# Patient Record
Sex: Female | Born: 1970
Health system: Southern US, Community
[De-identification: ages and names within clinical notes are randomized; demographics above are authoritative.]

## PROBLEM LIST (undated history)

## (undated) DIAGNOSIS — E119 Type 2 diabetes mellitus without complications: Secondary | ICD-10-CM

## (undated) DIAGNOSIS — I10 Essential (primary) hypertension: Secondary | ICD-10-CM

## (undated) DIAGNOSIS — K5792 Diverticulitis of intestine, part unspecified, without perforation or abscess without bleeding: Secondary | ICD-10-CM

## (undated) DIAGNOSIS — R112 Nausea with vomiting, unspecified: Secondary | ICD-10-CM

## (undated) DIAGNOSIS — K429 Umbilical hernia without obstruction or gangrene: Secondary | ICD-10-CM

## (undated) DIAGNOSIS — K219 Gastro-esophageal reflux disease without esophagitis: Secondary | ICD-10-CM

## (undated) DIAGNOSIS — Z9889 Other specified postprocedural states: Secondary | ICD-10-CM

## (undated) DIAGNOSIS — M069 Rheumatoid arthritis, unspecified: Secondary | ICD-10-CM

## (undated) HISTORY — PX: APPENDECTOMY: SHX54

## (undated) HISTORY — PX: ABDOMINOPLASTY: SUR9

## (undated) HISTORY — PX: WISDOM TOOTH EXTRACTION: SHX21

## (undated) HISTORY — PX: ABDOMINAL HYSTERECTOMY: SHX81

## (undated) HISTORY — PX: BACK SURGERY: SHX140

## (undated) HISTORY — PX: HERNIA REPAIR: SHX51

## (undated) HISTORY — DX: Rheumatoid arthritis, unspecified: M06.9

---

## 2010-06-12 LAB — CBC
HCT: 40.1 % (ref 36.0–46.0)
Hemoglobin: 14.3 g/dL (ref 12.0–15.0)
MCH: 31.4 pg (ref 26.0–34.0)
MCHC: 35.7 g/dL (ref 30.0–36.0)
MCV: 87.9 fL (ref 78.0–100.0)
Platelets: 197 10*3/uL (ref 150–400)
RBC: 4.56 MIL/uL (ref 3.87–5.11)
RDW: 13.2 % (ref 11.5–15.5)
WBC: 6 10*3/uL (ref 4.0–10.5)

## 2010-06-12 LAB — COMPREHENSIVE METABOLIC PANEL
ALT: 19 U/L (ref 0–35)
AST: 21 U/L (ref 0–37)
Albumin: 4.2 g/dL (ref 3.5–5.2)
Alkaline Phosphatase: 71 U/L (ref 39–117)
BUN: 10 mg/dL (ref 6–23)
CO2: 26 mEq/L (ref 19–32)
Calcium: 9.2 mg/dL (ref 8.4–10.5)
Chloride: 104 mEq/L (ref 96–112)
Creatinine, Ser: 0.71 mg/dL (ref 0.4–1.2)
GFR calc Af Amer: >60 mL/min (ref >60)
GFR calc non Af Amer: >60 mL/min (ref >60)
Glucose, Bld: 84 mg/dL (ref 70–99)
Potassium: 3.6 mEq/L (ref 3.5–5.1)
Sodium: 136 mEq/L (ref 135–145)
Total Bilirubin: 0.7 mg/dL (ref 0.3–1.2)
Total Protein: 7.3 g/dL (ref 6.0–8.3)

## 2010-06-12 LAB — SURGICAL PCR SCREEN
MRSA, PCR: NEGATIVE
Staphylococcus aureus: NEGATIVE

## 2010-06-13 ENCOUNTER — Ambulatory Visit (HOSPITAL_COMMUNITY)
Admission: RE | Admit: 2010-06-13 | Discharge: 2010-06-13 | Payer: Self-pay | Source: Home / Self Care | Attending: Obstetrics & Gynecology | Admitting: Obstetrics & Gynecology

## 2010-06-14 LAB — PREGNANCY, URINE: Preg Test, Ur: NEGATIVE

## 2010-06-14 LAB — GLUCOSE, CAPILLARY: Glucose-Capillary: 129 mg/dL — ABNORMAL HIGH (ref 70–99)

## 2010-06-19 ENCOUNTER — Encounter
Admission: RE | Admit: 2010-06-19 | Discharge: 2010-06-19 | Payer: Self-pay | Source: Home / Self Care | Attending: Obstetrics & Gynecology | Admitting: Obstetrics & Gynecology

## 2010-06-19 IMAGING — CR DG CHEST 2V
3 series · 3 of 3 positions shown · non-contrast
Comparison: None.

CLINICAL DATA: Short of breath.  Chest pain.

CHEST - 2 VIEW

[view not recorded (1 of 3)]
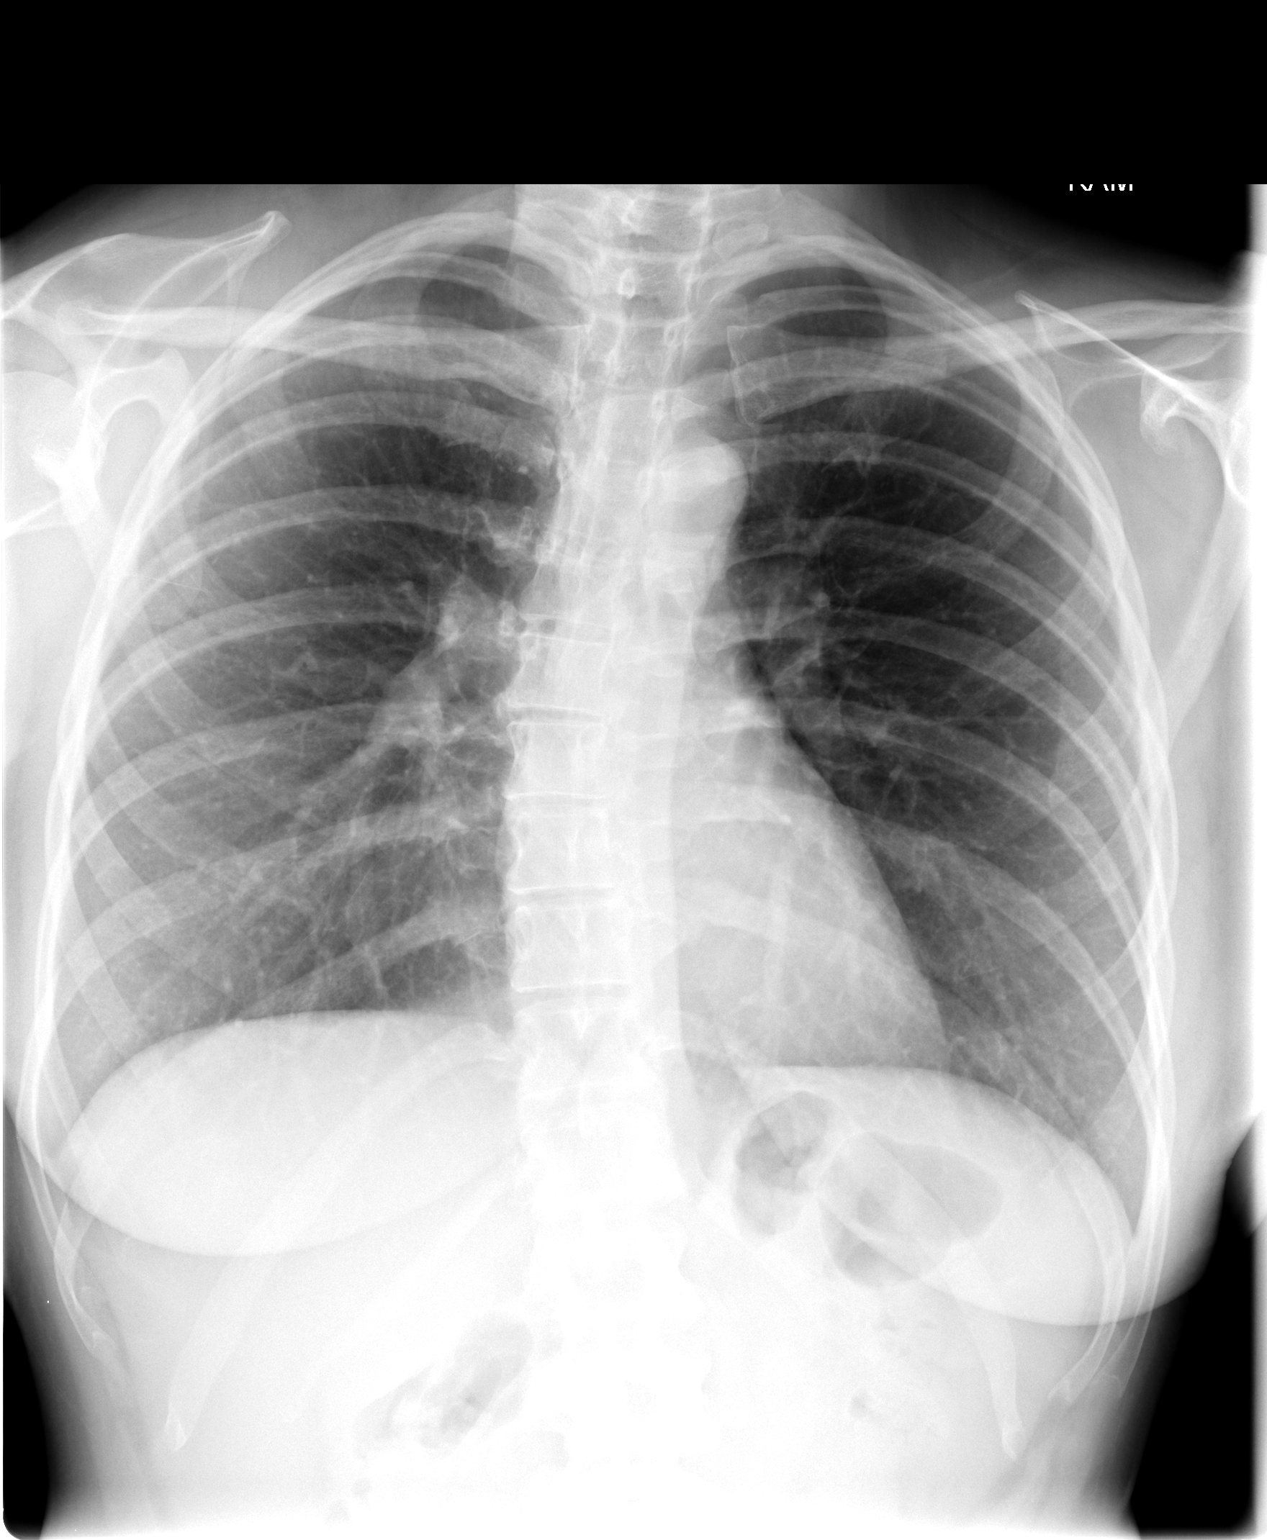

[view not recorded (2 of 3)]
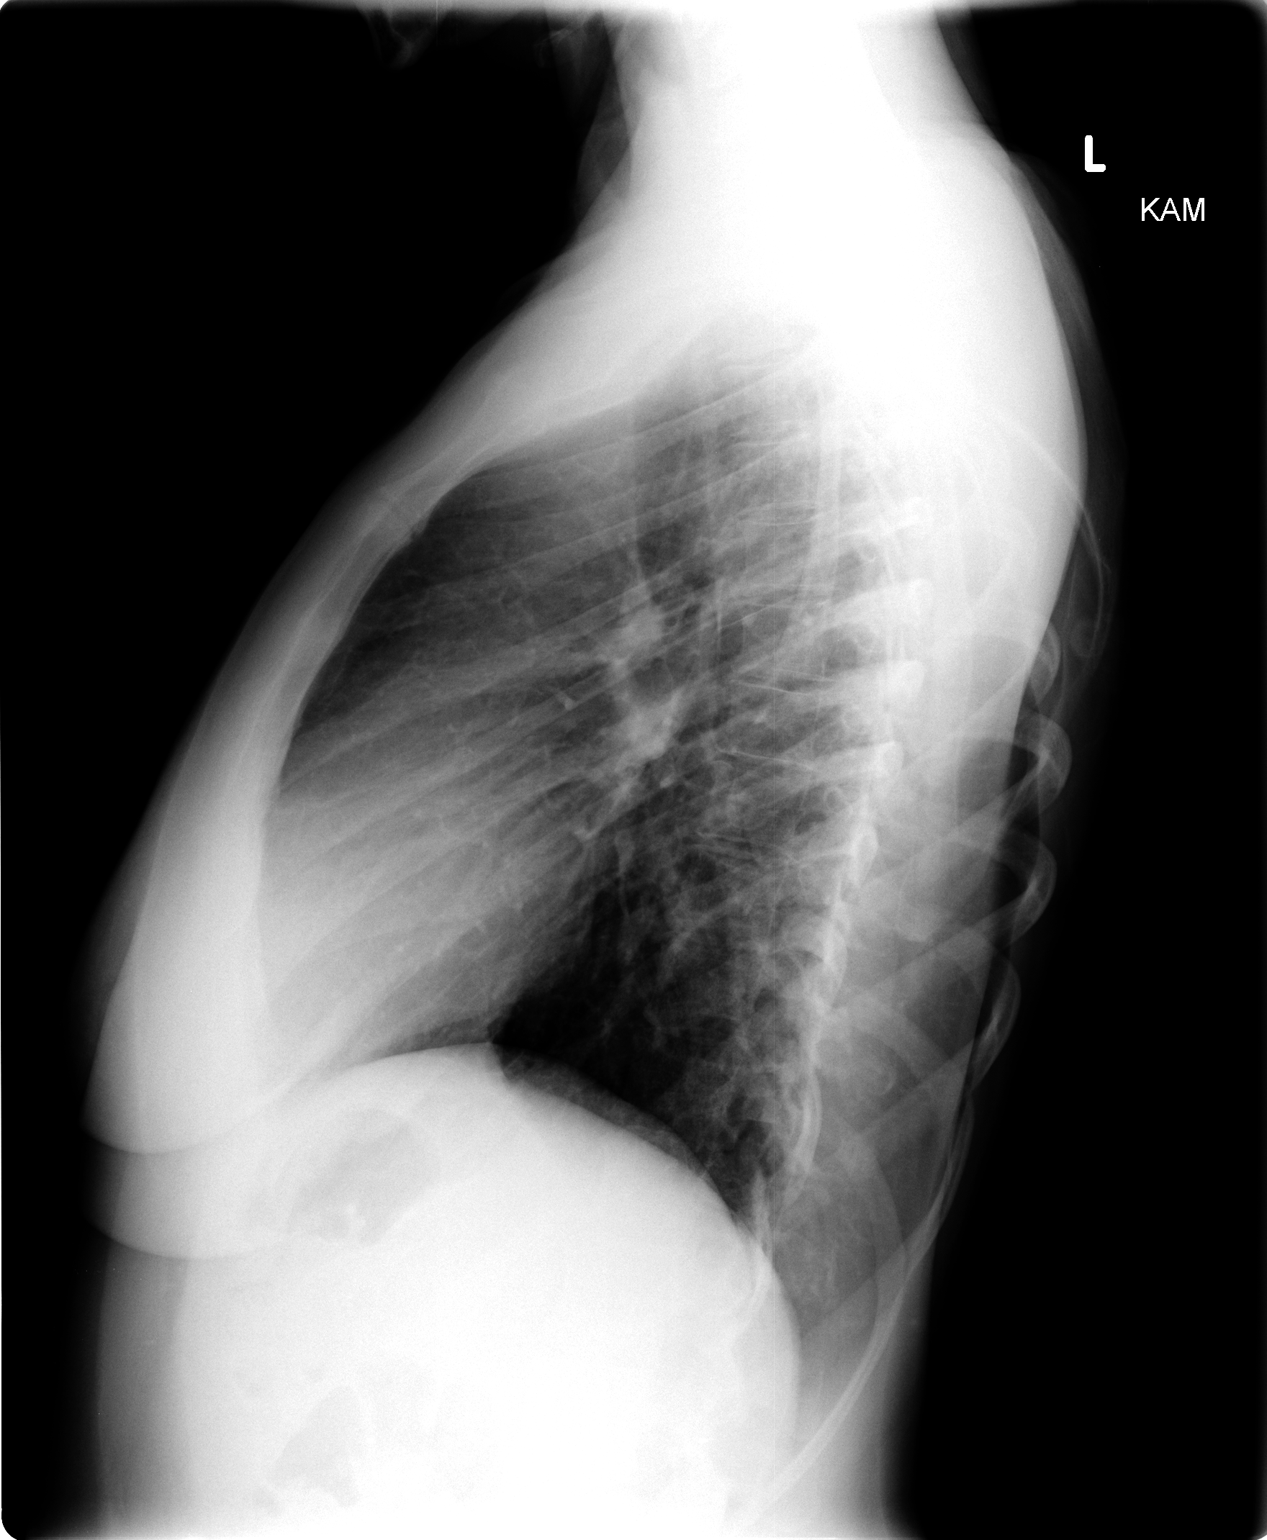

[view not recorded (3 of 3)]
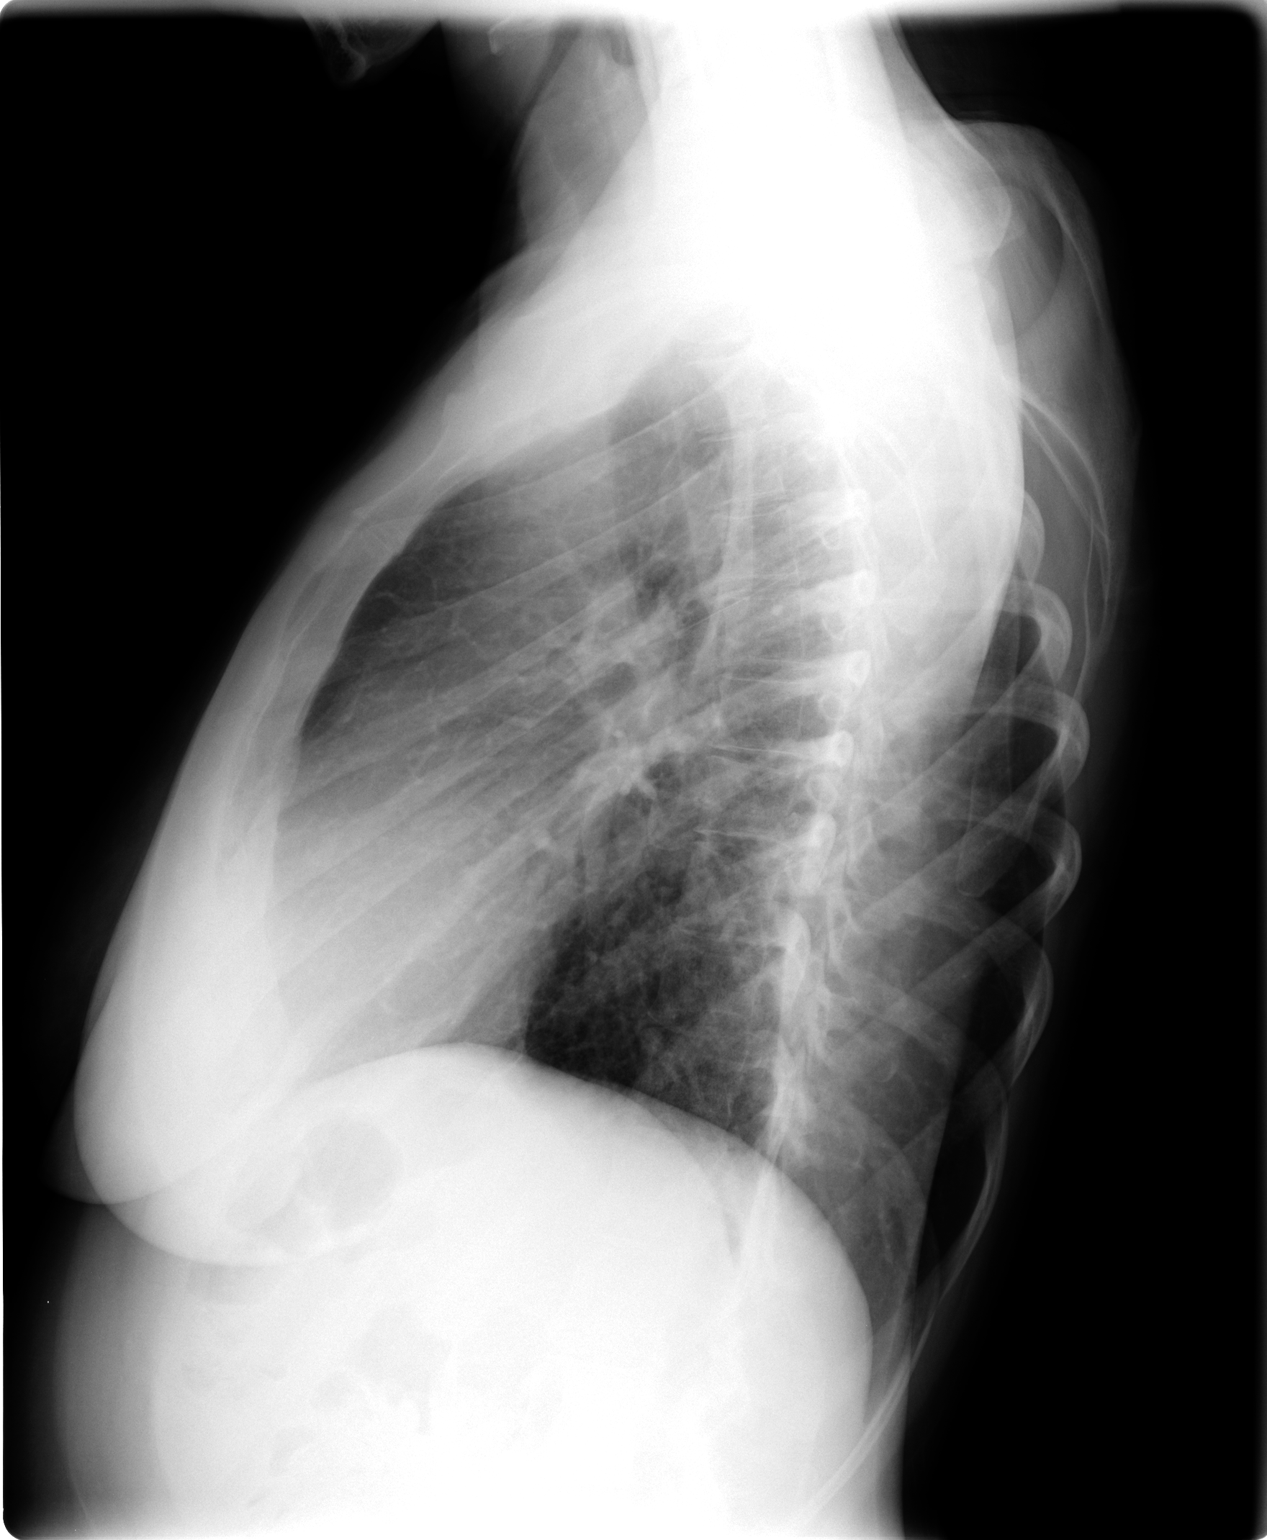

[3 of 3 positions shown; findings below may reference images not displayed]

FINDINGS: Patient is mildly rotated to the left.  The
cardiopericardial silhouette appears within normal limits.  There
is no airspace disease.  No effusion.
IMPRESSION: No active cardiopulmonary disease.

## 2010-06-23 NOTE — Op Note (Addendum)
Lisa Novak, Lisa Novak              ACCOUNT NO.:  [id number]  MEDICAL RECORD NO.:  [id number]          PATIENT TYPE:  AMB  LOCATION:  [facility]                           FACILITY:  [facility]  PHYSICIAN:  Genia Del, M.D.DATE OF BIRTH:  November 21, 1970  DATE OF PROCEDURE:  06/13/2010 DATE OF DISCHARGE:                              OPERATIVE REPORT   PREOPERATIVE DIAGNOSIS:  Refractory dysmenorrhea and menorrhagia.  POSTOPERATIVE DIAGNOSIS:  Refractory dysmenorrhea and menorrhagia.  PROCEDURE:  Total laparoscopy hysterectomy assisted with Engineer, building services robot.  SURGEON:  Genia Del, MD  ASSISTANT:  Arlan Organ, MD  ANESTHESIOLOGIST:  Quillian Quince, MD  DESCRIPTION OF PROCEDURE:  Under general anesthesia with endotracheal intubation, the patient is in lithotomy position, she was prepped with SurgiPrep on the abdomen and with Betadine on the vulvar and vaginal areas.  The patient was then draped as usual.  The vaginal exam revealed an anteverted uterus about 9 cm.  No adnexal mass.  The weighted speculum was inserted in the vagina.  The anterior lip of the cervix was grasped with a tenaculum.  The hysterometry is at 9 cm.  A #8 RUMI was therefore used.  The large KOH ring was used given the size of the cervix.  We dilated the cervix with Hegar dilators up to #29 without difficulty.  We then inserted the RUMI with KOH ring without difficulty. We put an occluder as well.  We removed the tenaculum and weighted speculum.  The Foley was put in place in the bladder.  We then did go to the abdomen.  The patient had a tummy tuck previously, but no mesh were put in place.  We used the umbilicus and infiltrating the subcutaneous tissue at the upper aspect of it with Marcaine 1/4 plain.  We opened with a scalpel over 1.5 cm at the site of the previous incision.  We opened the aponeurosis under direct vision with Mayo scissors and opened the parietal peritoneum bluntly with a finger.  We put a  pursestring stitch of Vicryl 0 at the aponeurosis.  The Hasson was inserted at that level.  A pneumoperitoneum was created with CO2.  We inspected the abdominopelvic cavities.  No lesion was seen in the abdomen.  The anterior wall was free of adhesions.  The pelvic structures appeared normal.  Both ovaries were normal in size and appearance.  Both tubes were normal in size and appearance.  The uterus was slightly enlarged, measuring about 9 cm in diameter.  No adhesion was present in the pelvis.  We marked the skin to use a semicircular configuration with 2 robotic ports on the left, 1 robotic port on the right, and the assistant port on the lower right.  The skin was infiltrated with Marcaine 1/4 plain at each site and small incisions were made with a scalpel at each site.  We entered the trocars under direct vision.  We then positioned the patient in deep Trendelenburg and docked the robot without problems on the left side.  The instruments were inserted,  the Endo shear scissors in the first robotic arm, the PK in the secondrobotic arm, and the  Cobra clamp in the third robotic arm.  We did go to the console.  We started on the left side.  We cauterized and sectioned the left round ligament, cauterized and sectioned the left tube, and the left utero-ovarian ligament.  We moved on the lateral side of the uterus and stopped just before the uterine artery.  We opened the visceral peritoneum anteriorly and started descending the bladder.  We proceeded exactly the same way on the right side and then further descended the bladder past the KOH ring by about 1.5 cm.  We then cauterized and sectioned the left uterine artery and finally cauterized and sectioned the right uterine artery.  The uterus was blanching well.  We inflated the occluder and we used the tip of the Endo Shear scissors to open the superior aspect of the vagina just at the upper rim of the KOH ring.  We started anteriorly, did  go to the left lateral side, right lateral side, and finished posteriorly.  The uterus was completely detached with the cervix and it was passed without morcellation vaginally.  We left the uterus in the vagina for occlusion.  We changed robotic instruments. The cutting needle driver in the first arm, the regular needle driver in the second robotic arm, and the PK in the third robotic arm.  We used Vicryl 0 6 inches and closed the vaginal vault with 5 figure-of-eights, starting on the right angle, the left angle, and then the middle.  We made sure we have good bites including the vaginal mucosa each time. The vaginal vault was well closed.  Hemostasis was completed with PK. Before closure and after closure, hemostasis was adequate at all levels. We therefore removed all robotic instruments.  We undocked the robot and did go to laparoscopy time.  The Trendelenburg is reversed and the patient's legs were brought up to a little bit.  We irrigated and suctioned the abdominopelvic cavities.  Both ureters were seen in robotic time, peristalsing well.  We confirmed good hemostasis at all levels.  The ovaries were normal in appearance.  Good hemostasis at that level as well.  The appendix was seen and appeared normal.  The liver was normal to inspection.  We therefore removed all instruments.  We evacuated the CO2 as much as possible.  We then removed all trocars under direct vision.  We completed hemostasis with the electrocautery on the incisions where required.  We then attached the pursestring stitch at the supraumbilical incision.  We put a separate stitch at the adipose tissue at the supraumbilical incision with Vicryl 4-0 and we closed all incisions with a subcuticular stitch of Vicryl 4-0 and put Dermabond. The uterus and cervix were removed from the vagina and sent to Pathology.  We verified that hemostasis was good vaginally and the Foley catheter was removed.  The estimated blood loss  was 75 mL.  The patient received cefoxitin IV before induction.  The count of sponges and instruments was complete.  No complications occurred and the patient was brought to recovery room in good stable status.     Genia Del, M.D.     ML/MEDQ  D:  06/13/2010  T:  06/13/2010  Job:  [id number]  Electronically Signed by Genia Del M.D. on 06/23/2010 09:16:16 AM

## 2012-08-28 ENCOUNTER — Other Ambulatory Visit: Payer: Self-pay | Admitting: Internal Medicine

## 2012-08-28 DIAGNOSIS — I10 Essential (primary) hypertension: Secondary | ICD-10-CM

## 2012-08-29 ENCOUNTER — Ambulatory Visit
Admission: RE | Admit: 2012-08-29 | Discharge: 2012-08-29 | Disposition: A | Discharge status: Home / Self Care | Payer: PRIVATE HEALTH INSURANCE | Source: Ambulatory Visit | Attending: Internal Medicine | Admitting: Internal Medicine

## 2012-08-29 DIAGNOSIS — I10 Essential (primary) hypertension: Secondary | ICD-10-CM

## 2012-08-29 IMAGING — US US RENAL
1 series · 14 of 25 positions shown · non-contrast
Comparison: None.

CLINICAL DATA: Hypertension

RENAL/URINARY TRACT ULTRASOUND COMPLETE

[Series 1: us renal · 0.26mm/px · 14 of 33 slices shown]
[im 1/33]
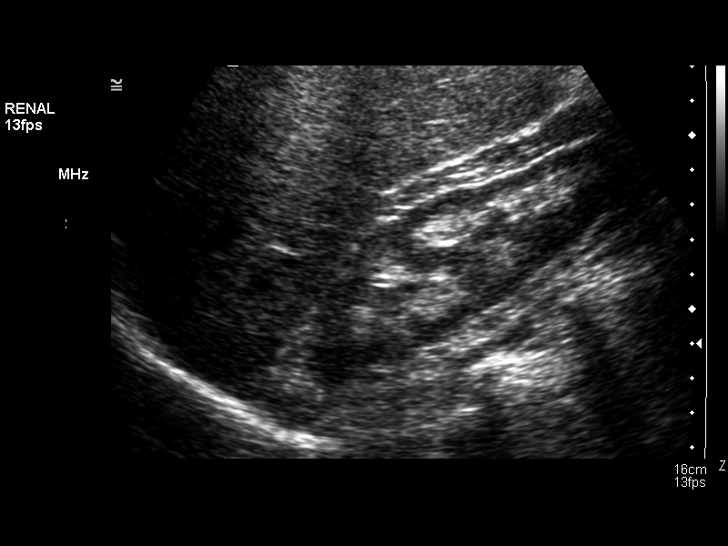
[im 3/33]
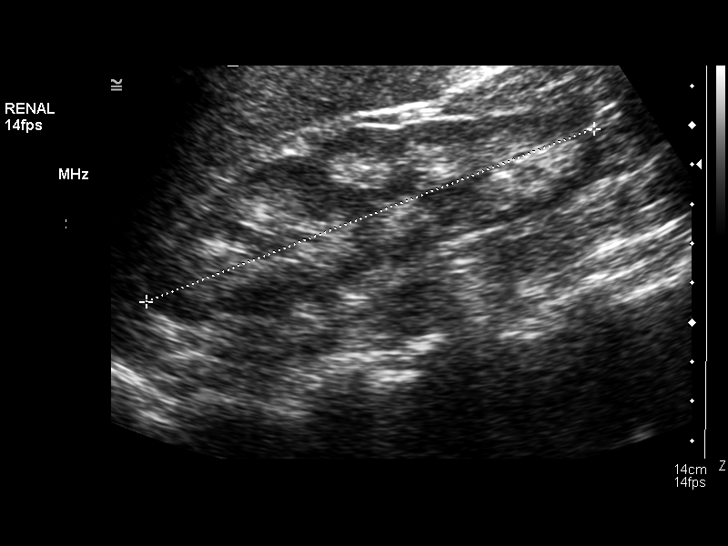
[im 6/33]
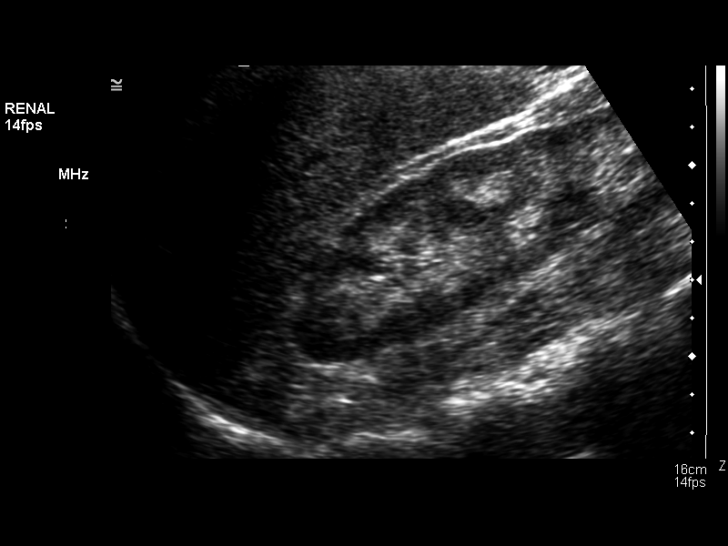
[im 9/33]
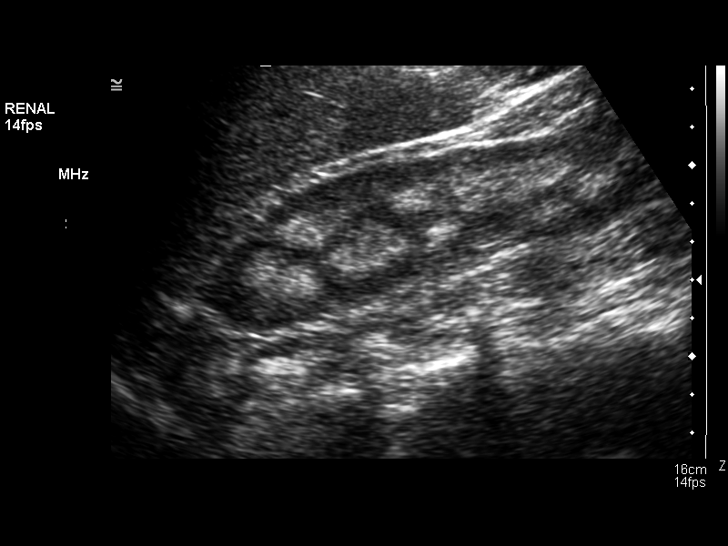
[im 11/33]
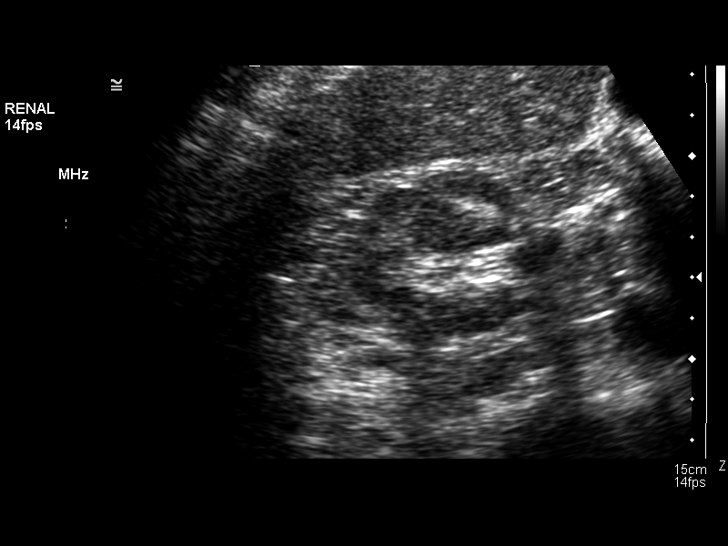
[im 13/33]
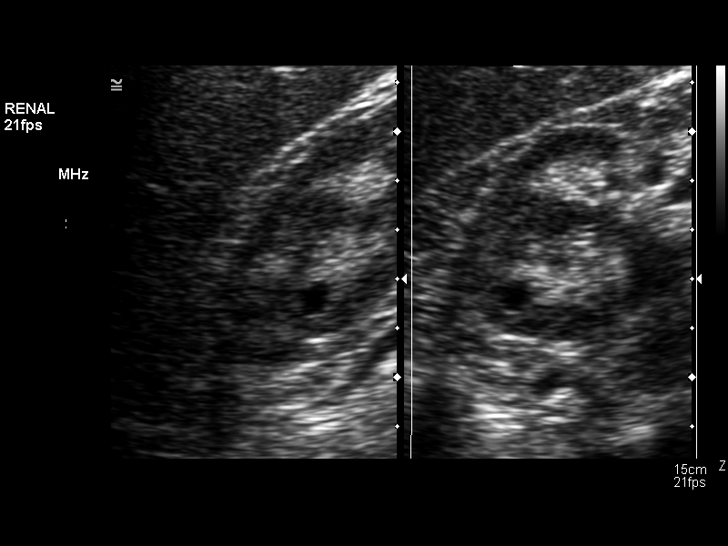
[im 15/33]
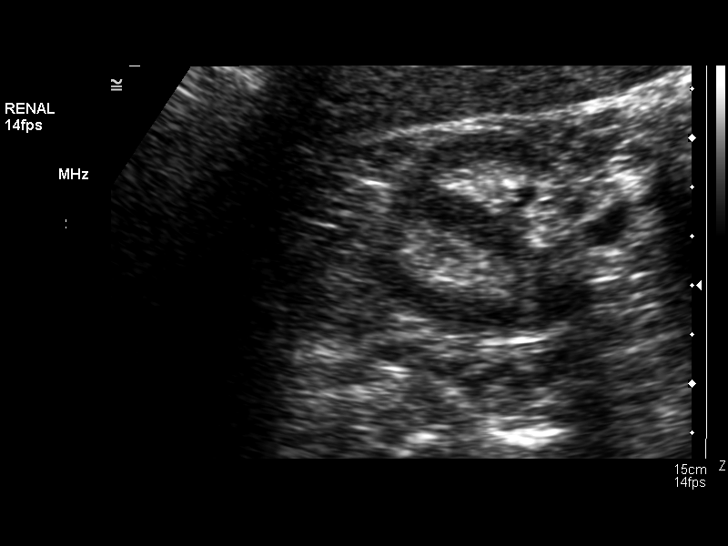
[im 18/33]
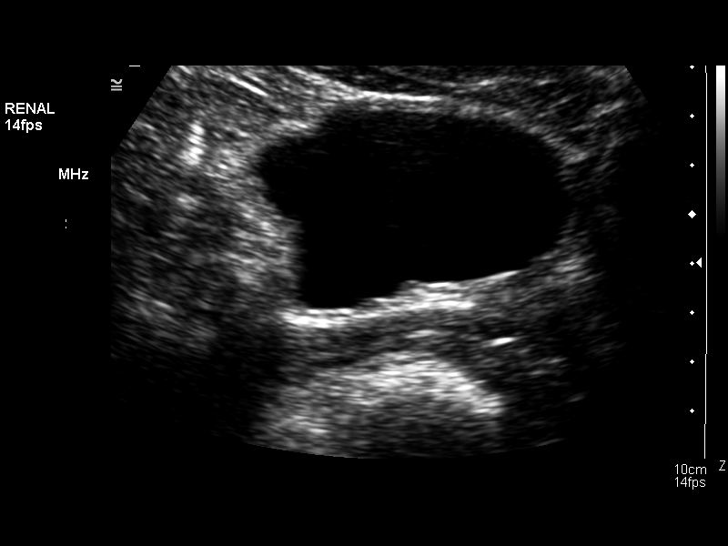
[im 21/33]
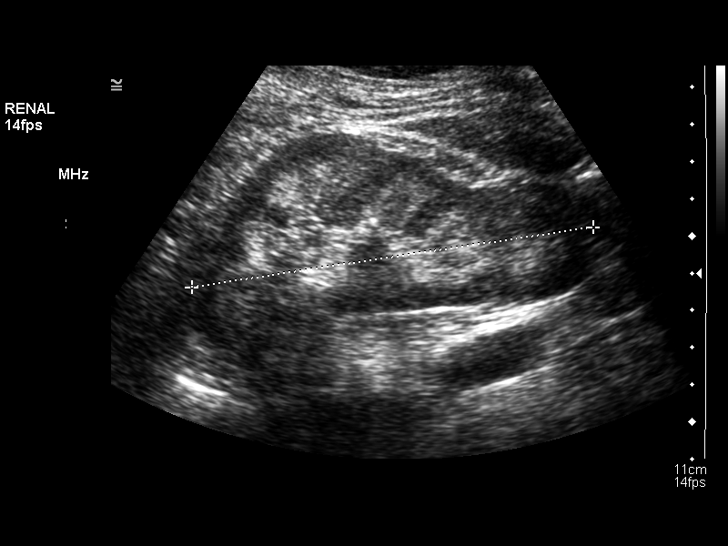
[im 22/33]
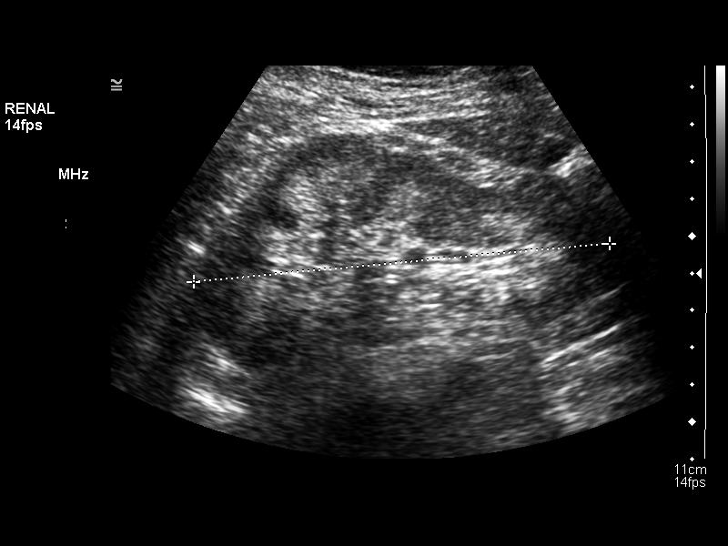
[im 25/33]
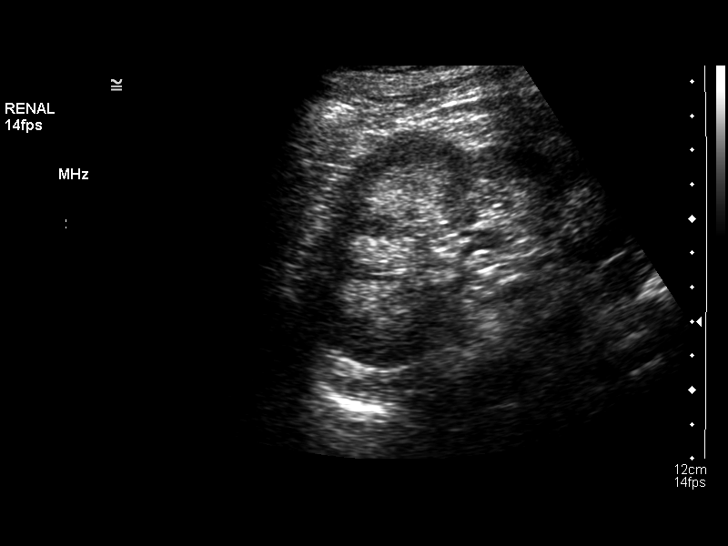
[im 27/33]
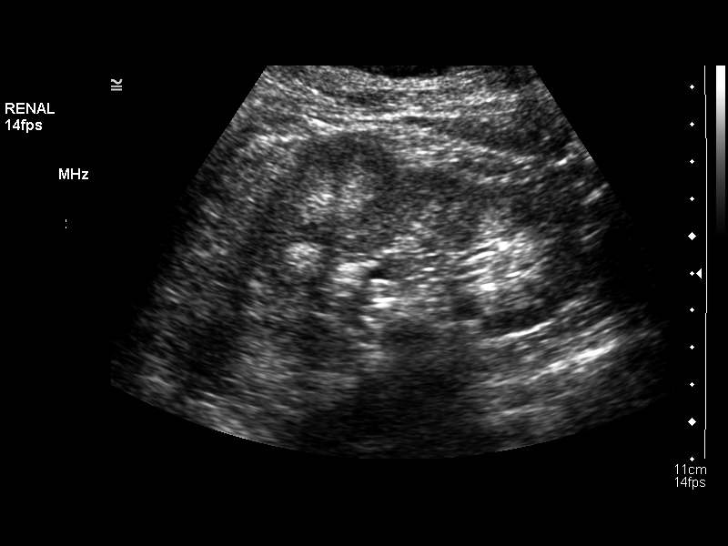
[im 30/33]
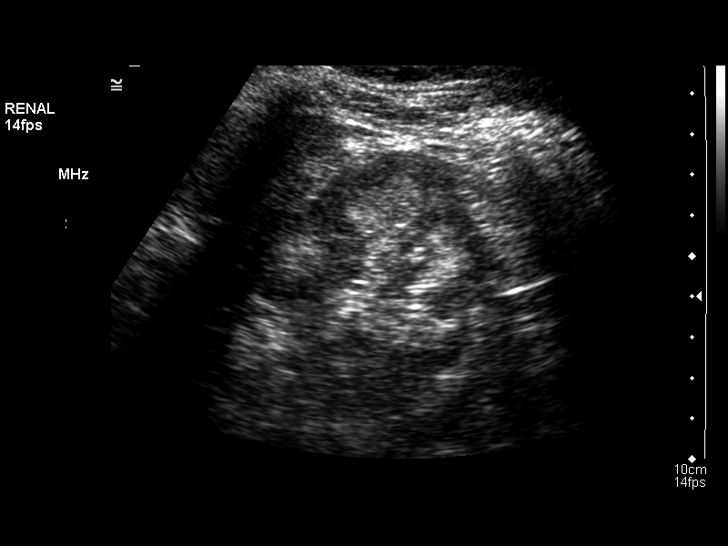
[im 33/33]
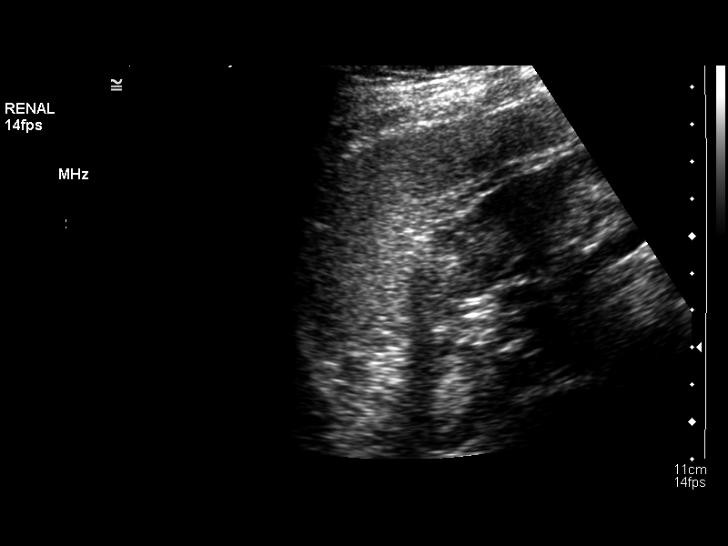

[14 of 25 positions shown; findings below may reference images not displayed]

FINDINGS: Right Kidney:  Measures 12.4 cm.  Increased echogenicity within the
renal pyramids noted.  No evidence of mass or hydronephrosis.  Cyst
within the upper pole of the right kidney measures 0.8 x 0.5 x
cm.

Left Kidney:  Measures 11.2 cm.  Increased echogenicity within the
renal pyramids.  Normal in size.  No evidence of mass or
hydronephrosis.

Bladder:  Appears normal for degree of bladder distention.
IMPRESSION: 1.  Normal sized kidneys without evidence for hydronephrosis.
2.  Increased echogenicity of the renal pyramids suggestive of
nephrocalcinosis.

## 2012-10-14 ENCOUNTER — Other Ambulatory Visit: Payer: Self-pay | Admitting: Nephrology

## 2012-10-21 ENCOUNTER — Ambulatory Visit
Admission: RE | Admit: 2012-10-21 | Discharge: 2012-10-21 | Disposition: A | Discharge status: Home / Self Care | Payer: PRIVATE HEALTH INSURANCE | Source: Ambulatory Visit | Attending: Nephrology | Admitting: Nephrology

## 2012-10-21 IMAGING — CT CT ABD-PELV W/O CM
3 of 4 series · 13 of 36 positions shown, 19 images · non-contrast
Comparison: Ultrasound of the kidneys of 08/29/2012

***ADDENDUM*** CREATED: 10/24/2012 [DATE]

On coronal images, there may be a single 2 mm mid right renal
calculus.  No hydronephrosis is seen.
***END ADDENDUM*** SIGNED BY: Biz Tiger, M.D.
CLINICAL DATA: History kidney stones, elevated heart rate
CT ABDOMEN AND PELVIS WITHOUT CONTRAST
TECHNIQUE: Multidetector CT imaging of the abdomen and pelvis was
performed following the standard protocol without intravenous
contrast.

[Series 3: renal stone · axial · 0.85mm/px · z∈[-332,-22]mm · 8 of 80 slices shown, 13 images]
[im 9/80  soft-tissue]
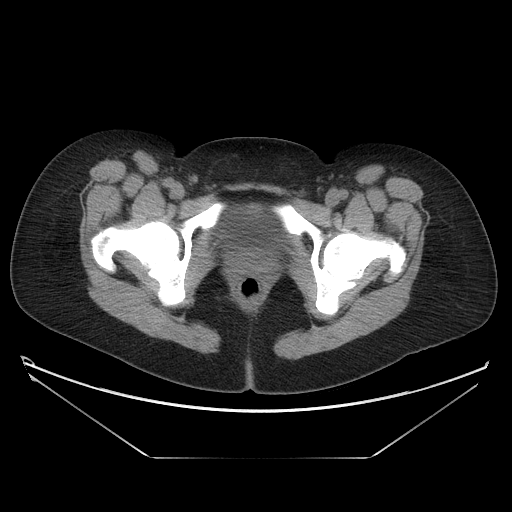
[im 9/80  bone]
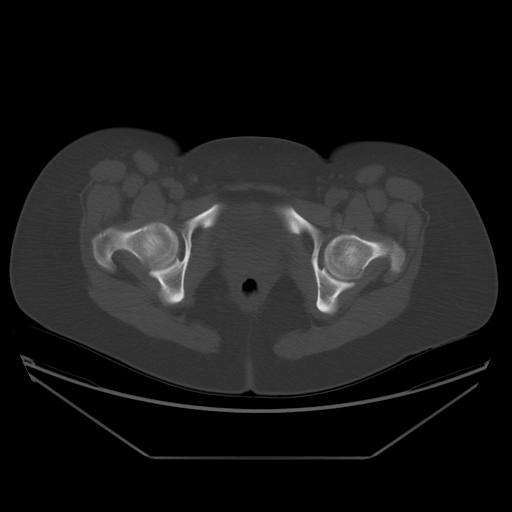
[im 18/80  soft-tissue]
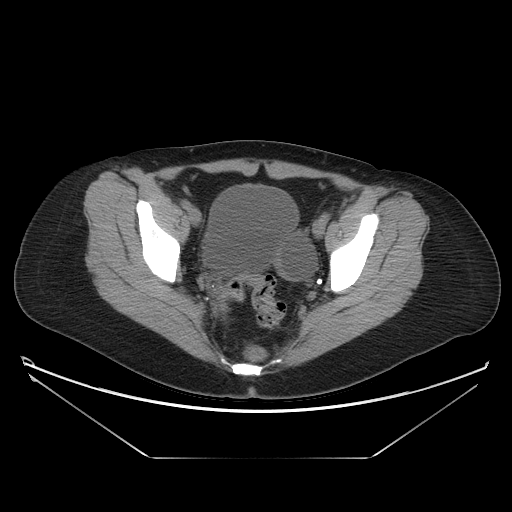
[im 27/80  soft-tissue]
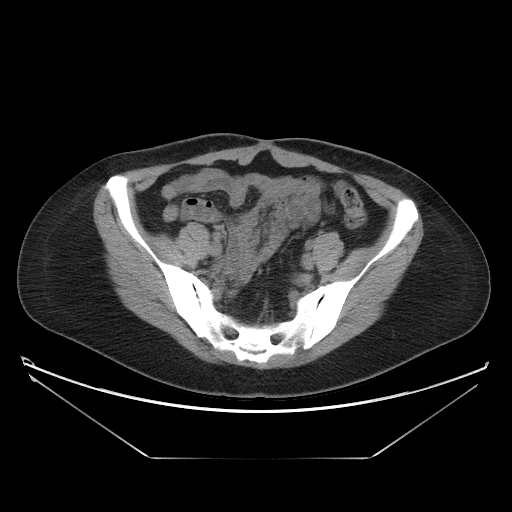
[im 36/80  soft-tissue]
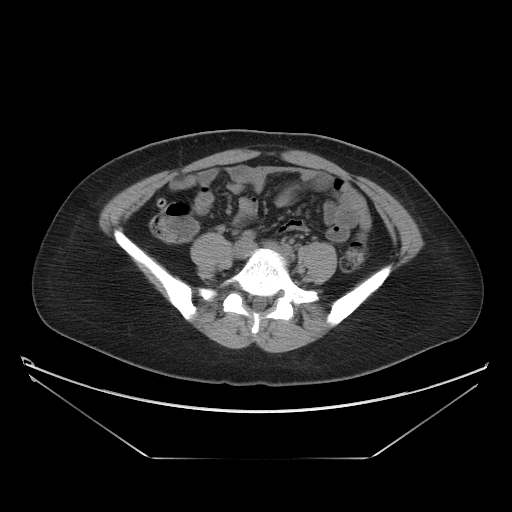
[im 44/80  soft-tissue]
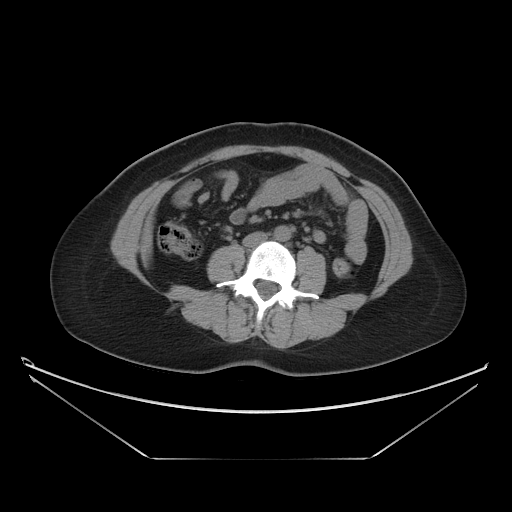
[im 44/80  lung]
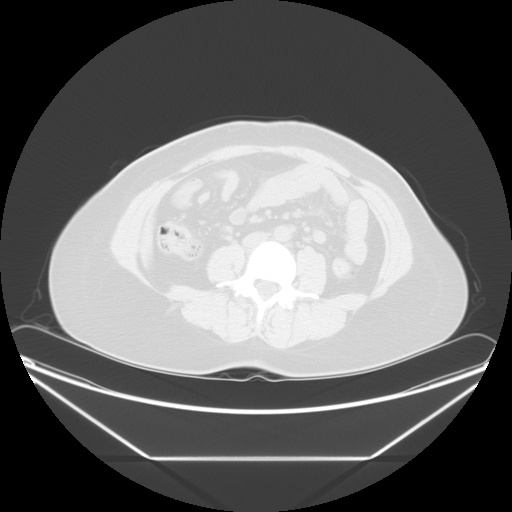
[im 53/80  soft-tissue]
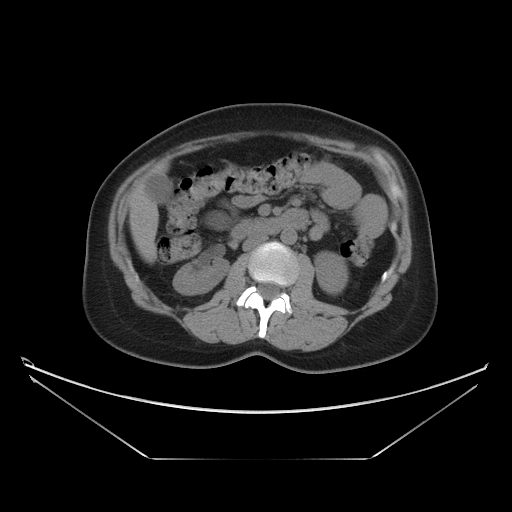
[im 53/80  lung]
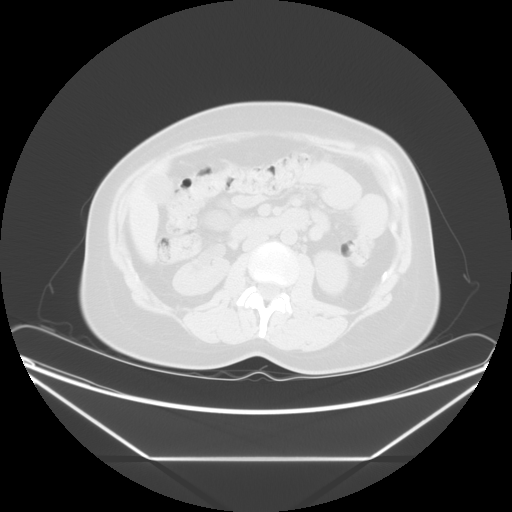
[im 62/80  soft-tissue]
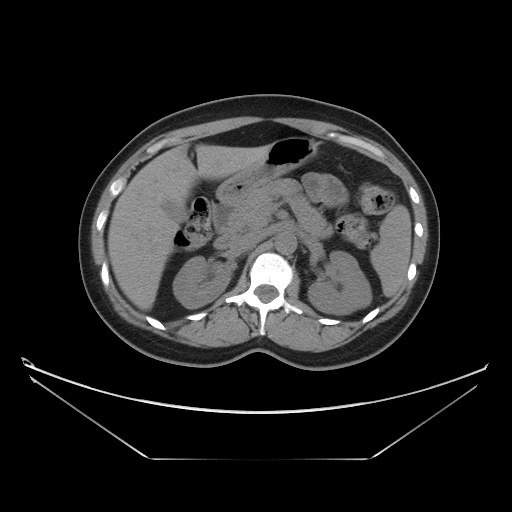
[im 62/80  lung]
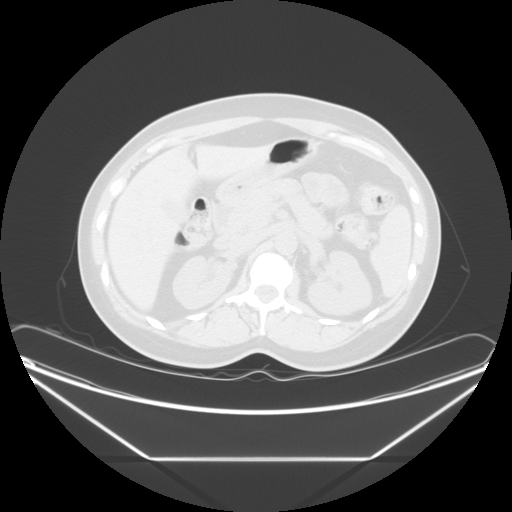
[im 71/80  soft-tissue]
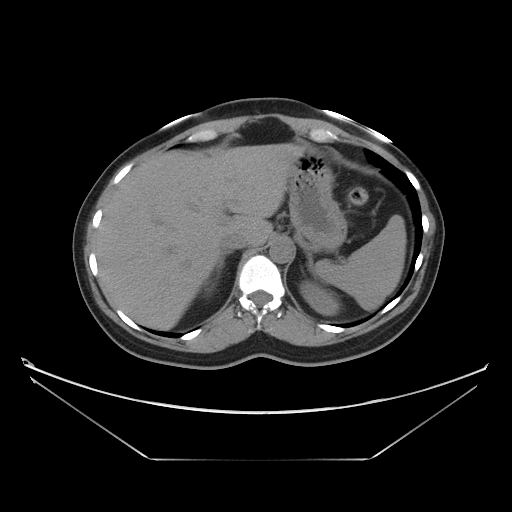
[im 71/80  lung]
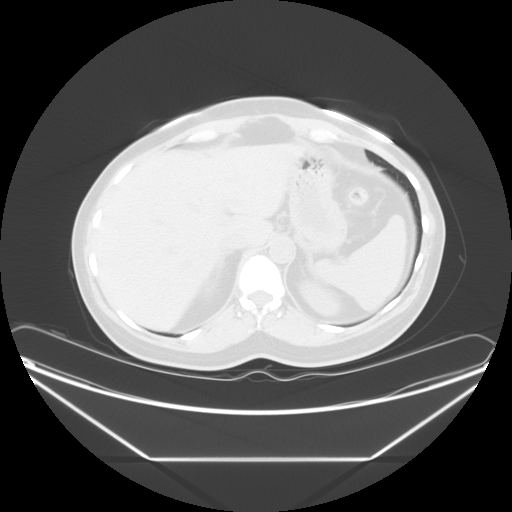

[Series 601: coronal body · coronal · 0.85mm/px · 1 of 112 slices shown, 2 images]
[im 38/112  soft-tissue]
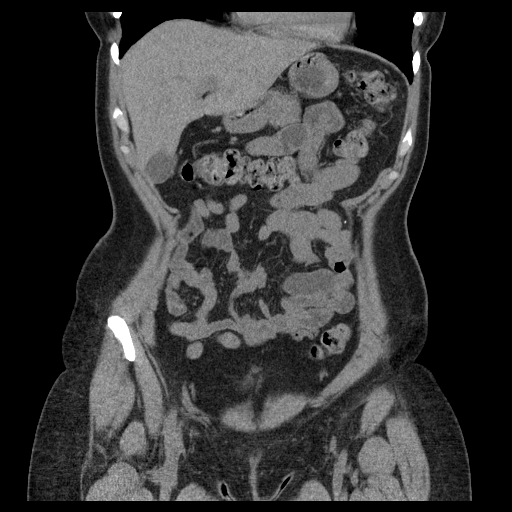
[im 38/112  bone]
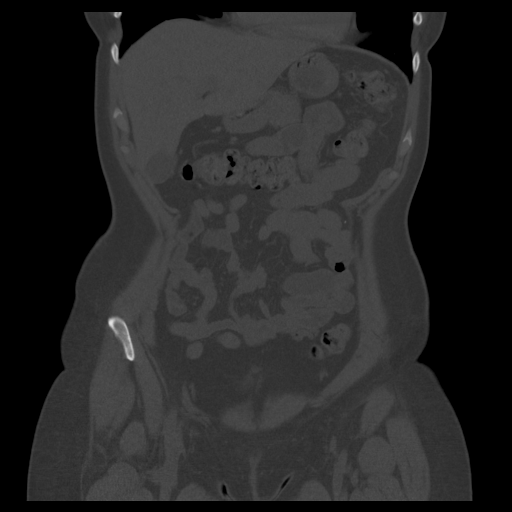

[Series 602: sagittal body · sagittal · 0.85mm/px · 4 of 168 slices shown]
[im 18/168  soft-tissue]
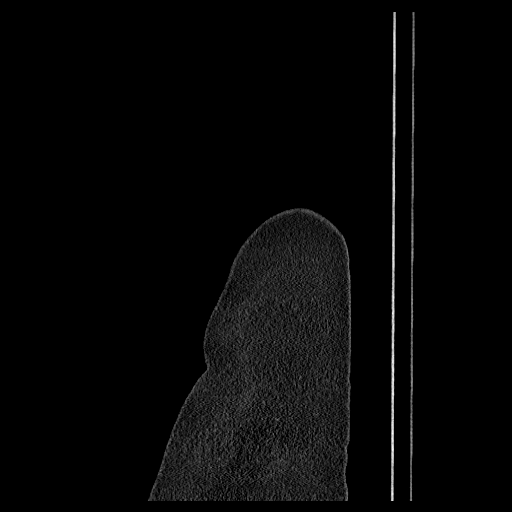
[im 36/168  soft-tissue]
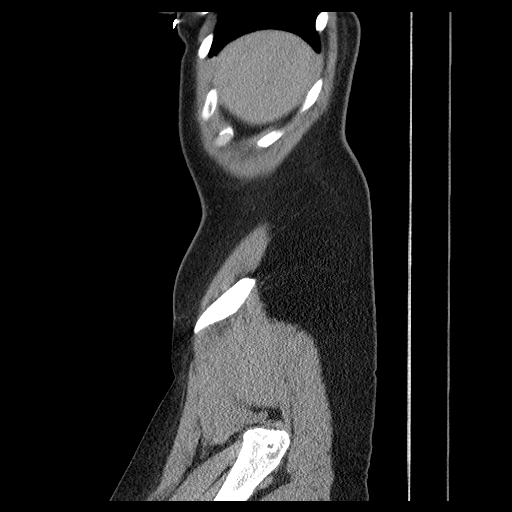
[im 53/168  soft-tissue]
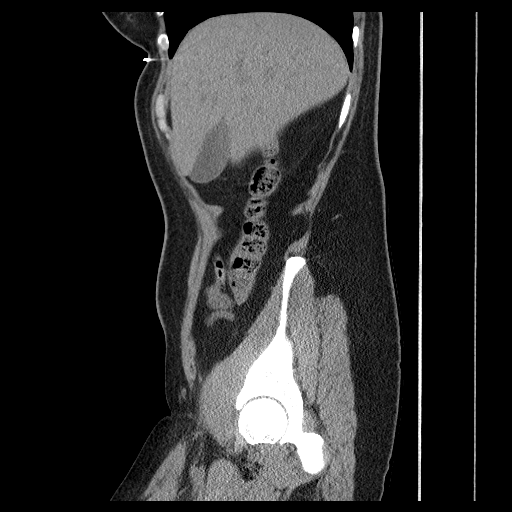
[im 71/168  soft-tissue]
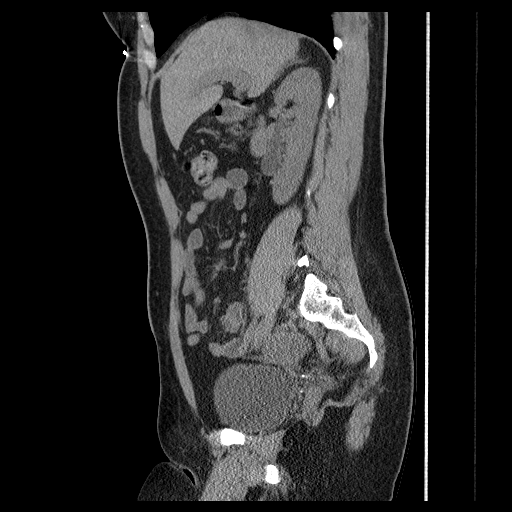

[13 of 36 positions shown; findings below may reference images not displayed]

FINDINGS: The lung bases are clear.  The liver is unremarkable in
the unenhanced state.  No calcified gallstones are seen.  The
pancreas is grossly normal in size with no focal abnormality on
this unenhanced study.  The adrenal glands and spleen are
unremarkable.  The stomach is not well distended.  No renal calculi
are seen.  The ureters are normal in caliber.  Abdominal aorta
appears normal in caliber.  No adenopathy is seen.

The distal ureters also are normal in caliber and no distal
ureteral calculi are noted.  The urinary bladder is unremarkable.
The uterus appears have been resected.  There is a left ovarian
cyst present of 4.1 x 3.1 cm with an attenuation of 0 HU.  A
probable slightly more complex right ovarian cyst is noted of 2.4 x
2.6 cm with an attenuation of 14 HU.  No free fluid is seen within
the pelvis.  There are scattered rectosigmoid colonic diverticula
noted.  No other abnormality of the colon is seen.  The terminal
ileum and the appendix are unremarkable.  No bony abnormality is
seen.
IMPRESSION: 1.  No renal or ureteral calculi are noted.
2.  The urinary bladder is unremarkable.
3.  Bilateral ovarian cysts as noted above.  No free fluid.
4.  Scattered rectosigmoid colonic diverticula.

## 2013-06-16 ENCOUNTER — Other Ambulatory Visit: Payer: Self-pay

## 2013-06-16 DIAGNOSIS — Z1231 Encounter for screening mammogram for malignant neoplasm of breast: Secondary | ICD-10-CM

## 2013-07-07 ENCOUNTER — Ambulatory Visit
Admission: RE | Admit: 2013-07-07 | Discharge: 2013-07-07 | Disposition: A | Discharge status: Home / Self Care | Payer: PRIVATE HEALTH INSURANCE | Source: Ambulatory Visit

## 2013-07-07 DIAGNOSIS — Z1231 Encounter for screening mammogram for malignant neoplasm of breast: Secondary | ICD-10-CM

## 2013-07-10 ENCOUNTER — Other Ambulatory Visit: Payer: Self-pay | Admitting: Internal Medicine

## 2013-07-10 DIAGNOSIS — R928 Other abnormal and inconclusive findings on diagnostic imaging of breast: Secondary | ICD-10-CM

## 2013-07-22 ENCOUNTER — Ambulatory Visit
Admission: RE | Admit: 2013-07-22 | Discharge: 2013-07-22 | Disposition: A | Discharge status: Home / Self Care | Payer: PRIVATE HEALTH INSURANCE | Source: Ambulatory Visit | Attending: Internal Medicine | Admitting: Internal Medicine

## 2013-07-22 DIAGNOSIS — R928 Other abnormal and inconclusive findings on diagnostic imaging of breast: Secondary | ICD-10-CM

## 2013-12-21 ENCOUNTER — Other Ambulatory Visit: Payer: Self-pay | Admitting: Internal Medicine

## 2013-12-21 DIAGNOSIS — N6489 Other specified disorders of breast: Secondary | ICD-10-CM

## 2013-12-21 DIAGNOSIS — N63 Unspecified lump in unspecified breast: Secondary | ICD-10-CM

## 2014-01-19 ENCOUNTER — Ambulatory Visit
Admission: RE | Admit: 2014-01-19 | Discharge: 2014-01-19 | Disposition: A | Discharge status: Home / Self Care | Payer: PRIVATE HEALTH INSURANCE | Source: Ambulatory Visit | Attending: Internal Medicine | Admitting: Internal Medicine

## 2014-01-19 DIAGNOSIS — N6489 Other specified disorders of breast: Secondary | ICD-10-CM

## 2014-01-19 DIAGNOSIS — N63 Unspecified lump in unspecified breast: Secondary | ICD-10-CM

## 2014-02-11 ENCOUNTER — Encounter (HOSPITAL_COMMUNITY): Payer: Self-pay | Admitting: Emergency Medicine

## 2014-02-11 ENCOUNTER — Observation Stay (HOSPITAL_COMMUNITY)
Admission: EM | Admit: 2014-02-11 | Discharge: 2014-02-12 | Disposition: A | Discharge status: Home / Self Care | Payer: No Typology Code available for payment source | Attending: General Surgery | Admitting: General Surgery

## 2014-02-11 ENCOUNTER — Encounter (HOSPITAL_COMMUNITY): Payer: No Typology Code available for payment source | Admitting: Anesthesiology

## 2014-02-11 ENCOUNTER — Emergency Department (HOSPITAL_COMMUNITY): Payer: No Typology Code available for payment source

## 2014-02-11 ENCOUNTER — Observation Stay (HOSPITAL_COMMUNITY): Payer: No Typology Code available for payment source | Admitting: Anesthesiology

## 2014-02-11 ENCOUNTER — Encounter (HOSPITAL_COMMUNITY)
Admission: EM | Disposition: A | Discharge status: Home / Self Care | Payer: Self-pay | Source: Home / Self Care | Attending: Emergency Medicine

## 2014-02-11 ENCOUNTER — Emergency Department (INDEPENDENT_AMBULATORY_CARE_PROVIDER_SITE_OTHER)
Admission: EM | Admit: 2014-02-11 | Discharge: 2014-02-11 | Disposition: A | Discharge status: Nursing Home (Includes ICF) | Payer: PRIVATE HEALTH INSURANCE | Source: Home / Self Care | Attending: Family Medicine | Admitting: Family Medicine

## 2014-02-11 DIAGNOSIS — K3580 Unspecified acute appendicitis: Principal | ICD-10-CM | POA: Diagnosis present

## 2014-02-11 DIAGNOSIS — K219 Gastro-esophageal reflux disease without esophagitis: Secondary | ICD-10-CM | POA: Insufficient documentation

## 2014-02-11 DIAGNOSIS — Z90710 Acquired absence of both cervix and uterus: Secondary | ICD-10-CM | POA: Insufficient documentation

## 2014-02-11 DIAGNOSIS — I10 Essential (primary) hypertension: Secondary | ICD-10-CM | POA: Insufficient documentation

## 2014-02-11 DIAGNOSIS — K353 Acute appendicitis with localized peritonitis, without perforation or gangrene: Secondary | ICD-10-CM

## 2014-02-11 DIAGNOSIS — K429 Umbilical hernia without obstruction or gangrene: Secondary | ICD-10-CM | POA: Insufficient documentation

## 2014-02-11 DIAGNOSIS — E119 Type 2 diabetes mellitus without complications: Secondary | ICD-10-CM | POA: Insufficient documentation

## 2014-02-11 DIAGNOSIS — R1031 Right lower quadrant pain: Secondary | ICD-10-CM

## 2014-02-11 HISTORY — DX: Umbilical hernia without obstruction or gangrene: K42.9

## 2014-02-11 HISTORY — DX: Gastro-esophageal reflux disease without esophagitis: K21.9

## 2014-02-11 HISTORY — DX: Type 2 diabetes mellitus without complications: E11.9

## 2014-02-11 HISTORY — DX: Essential (primary) hypertension: I10

## 2014-02-11 HISTORY — PX: LAPAROSCOPIC APPENDECTOMY: SHX408

## 2014-02-11 HISTORY — DX: Diverticulitis of intestine, part unspecified, without perforation or abscess without bleeding: K57.92

## 2014-02-11 LAB — BASIC METABOLIC PANEL
Anion gap: 13 (ref 5–15)
BUN: 10 mg/dL (ref 6–23)
CO2: 26 mEq/L (ref 19–32)
Calcium: 9.5 mg/dL (ref 8.4–10.5)
Chloride: 101 mEq/L (ref 96–112)
Creatinine, Ser: 0.78 mg/dL (ref 0.50–1.10)
GFR calc Af Amer: >90 mL/min (ref >90)
GFR calc non Af Amer: >90 mL/min (ref >90)
Glucose, Bld: 85 mg/dL (ref 70–99)
Potassium: 3.6 mEq/L — ABNORMAL LOW (ref 3.7–5.3)
Sodium: 140 mEq/L (ref 137–147)

## 2014-02-11 LAB — POCT URINALYSIS DIP (DEVICE)
Bilirubin Urine: NEGATIVE
Glucose, UA: NEGATIVE mg/dL
Hgb urine dipstick: NEGATIVE
Ketones, ur: NEGATIVE mg/dL
Leukocytes, UA: NEGATIVE
Nitrite: NEGATIVE
Protein, ur: NEGATIVE mg/dL
Specific Gravity, Urine: 1.02 (ref 1.005–1.030)
Urobilinogen, UA: 0.2 mg/dL (ref 0.0–1.0)
pH: 7 (ref 5.0–8.0)

## 2014-02-11 LAB — POCT PREGNANCY, URINE: Preg Test, Ur: NEGATIVE

## 2014-02-11 LAB — CBC WITH DIFFERENTIAL/PLATELET
Basophils Absolute: 0 10*3/uL (ref 0.0–0.1)
Basophils Relative: 0 % (ref 0–1)
Eosinophils Absolute: 0.1 10*3/uL (ref 0.0–0.7)
Eosinophils Relative: 1 % (ref 0–5)
HCT: 39.9 % (ref 36.0–46.0)
Hemoglobin: 13.8 g/dL (ref 12.0–15.0)
Lymphocytes Relative: 12 % (ref 12–46)
Lymphs Abs: 1.2 10*3/uL (ref 0.7–4.0)
MCH: 30.9 pg (ref 26.0–34.0)
MCHC: 34.6 g/dL (ref 30.0–36.0)
MCV: 89.3 fL (ref 78.0–100.0)
Monocytes Absolute: 0.7 10*3/uL (ref 0.1–1.0)
Monocytes Relative: 7 % (ref 3–12)
Neutro Abs: 7.9 10*3/uL — ABNORMAL HIGH (ref 1.7–7.7)
Neutrophils Relative %: 80 % — ABNORMAL HIGH (ref 43–77)
Platelets: 218 10*3/uL (ref 150–400)
RBC: 4.47 MIL/uL (ref 3.87–5.11)
RDW: 13.5 % (ref 11.5–15.5)
WBC: 9.9 10*3/uL (ref 4.0–10.5)

## 2014-02-11 LAB — GLUCOSE, CAPILLARY: GLUCOSE-CAPILLARY: 127 mg/dL — AB (ref 70–99)

## 2014-02-11 IMAGING — CT CT ABD-PELV W/ CM
2 of 5 series · 16 of 46 positions shown, 18 images · IV contrast (Omni 300)
Comparison: 10/21/2012

CLINICAL DATA: Right lower quadrant abdominal pain for 3 days.
Evaluate for appendicitis.

EXAM:
CT ABDOMEN AND PELVIS WITH CONTRAST
TECHNIQUE: Multidetector CT imaging of the abdomen and pelvis was performed
using the standard protocol following bolus administration of
intravenous contrast.
CONTRAST:  25mL OMNIPAQUE IOHEXOL 300 MG/ML SOLN, 80mL OMNIPAQUE
IOHEXOL 300 MG/ML SOLN

[Series 2: abd/ pelvis 5.0 i30f 1 · axial · 0.77mm/px · z∈[+669,+1079]mm · 13 of 94 slices shown, 15 images]
[im 6/94  soft-tissue]
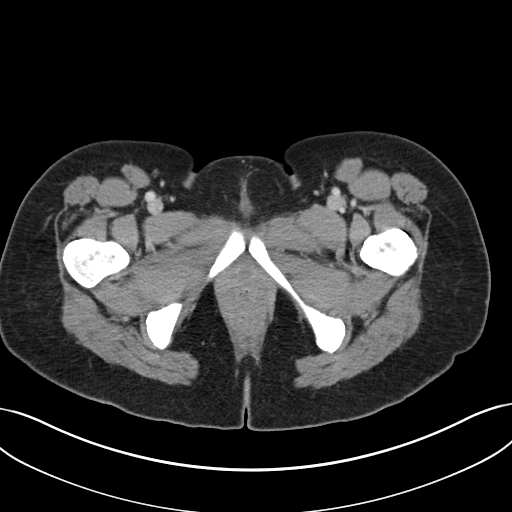
[im 6/94  bone]
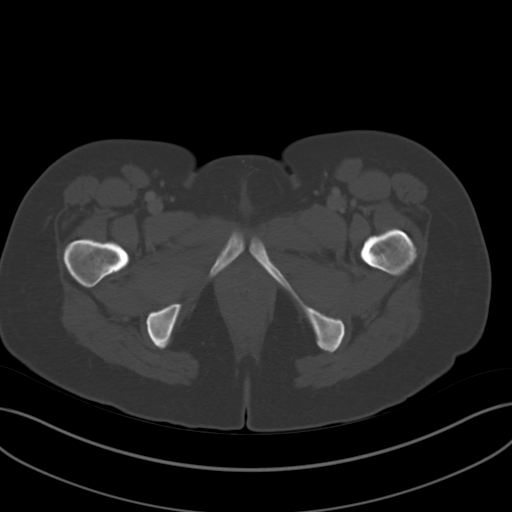
[im 11/94  soft-tissue]
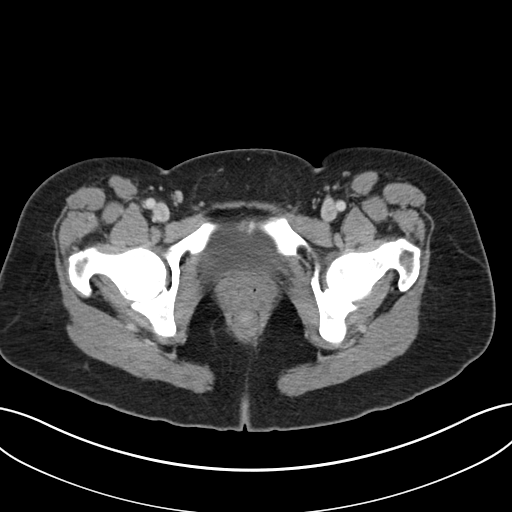
[im 22/94  soft-tissue]
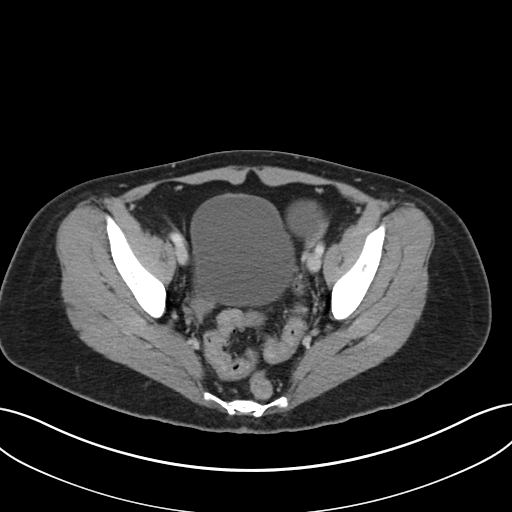
[im 28/94  soft-tissue]
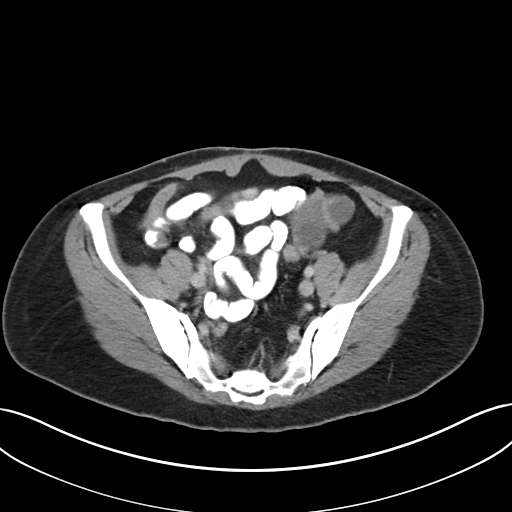
[im 33/94  soft-tissue]
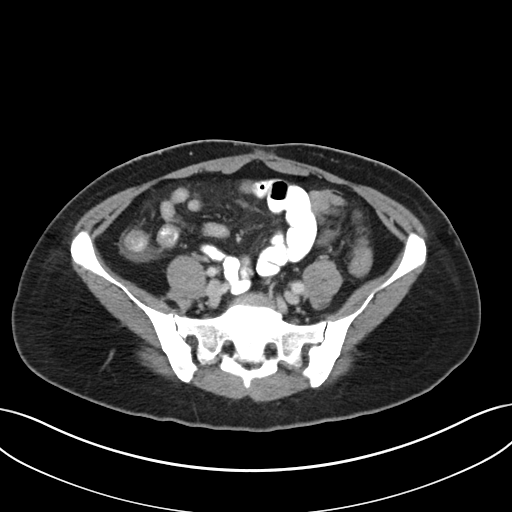
[im 39/94  soft-tissue]
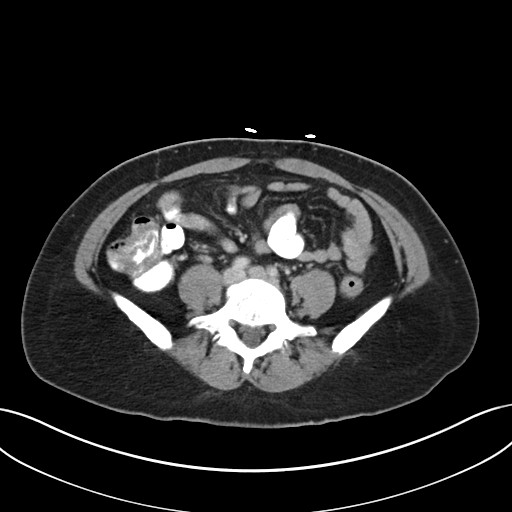
[im 50/94  soft-tissue]
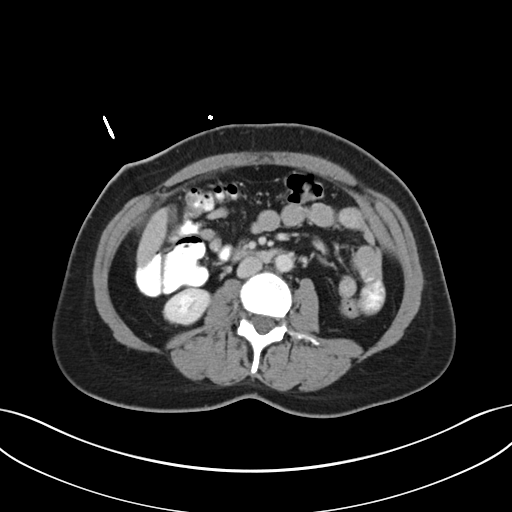
[im 55/94  soft-tissue]
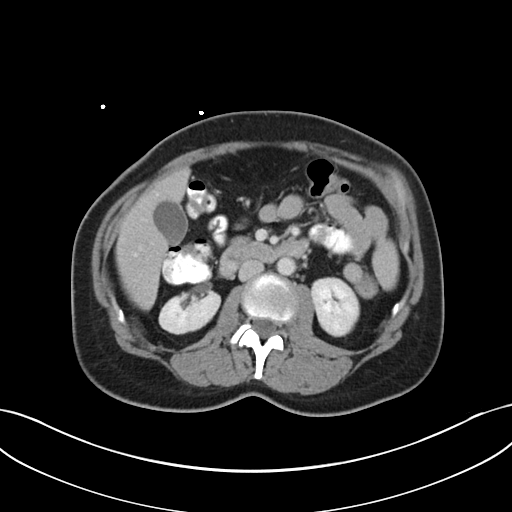
[im 61/94  soft-tissue]
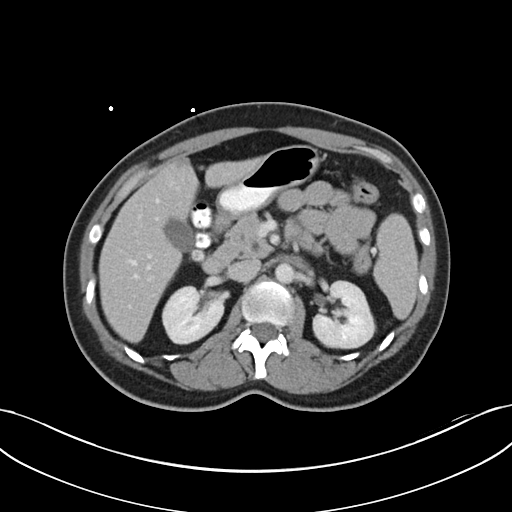
[im 61/94  bone]
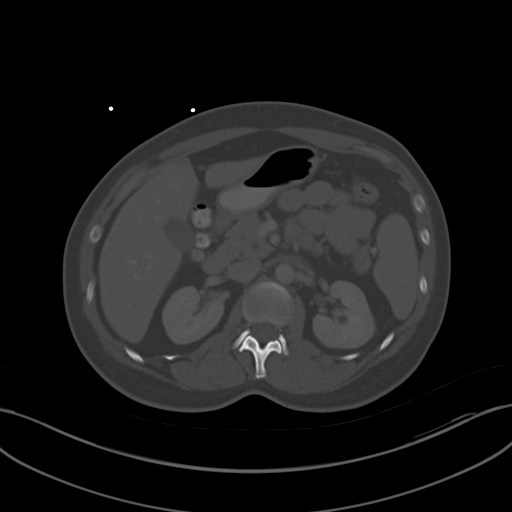
[im 66/94  soft-tissue]
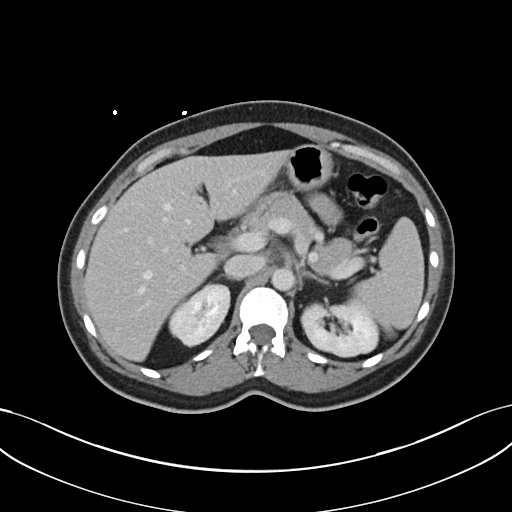
[im 72/94  soft-tissue]
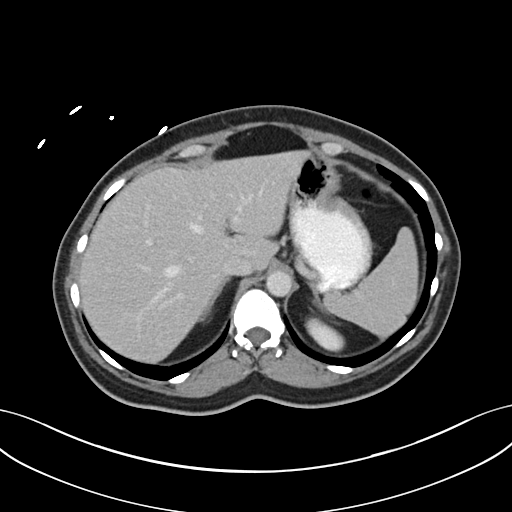
[im 83/94  soft-tissue]
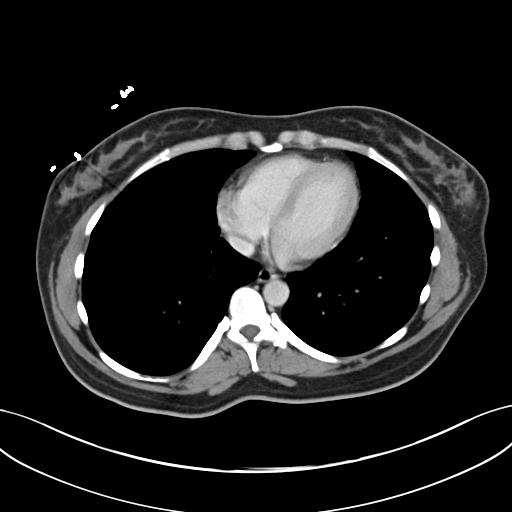
[im 88/94  soft-tissue]
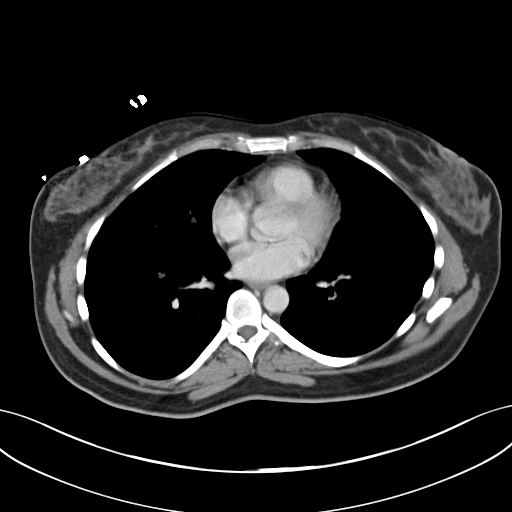

[Series 5: coronals · coronal · 0.70mm/px · 3 of 127 slices shown]
[im 43/127  soft-tissue]
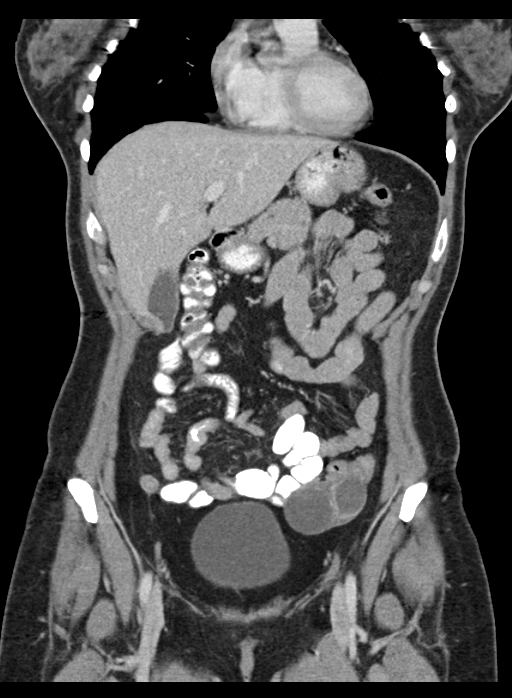
[im 57/127  soft-tissue]
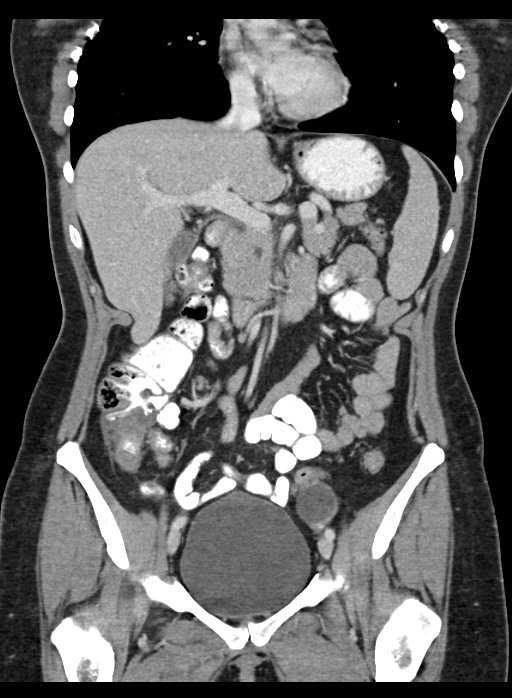
[im 71/127  soft-tissue]
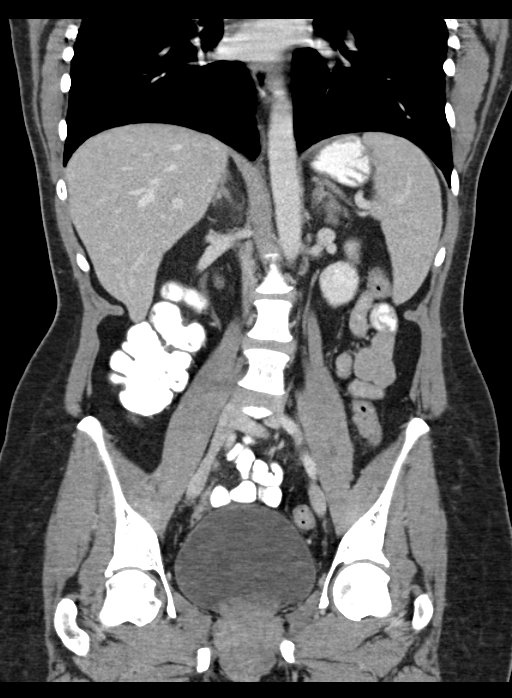

[16 of 46 positions shown; findings below may reference images not displayed]

FINDINGS: Visualized lung bases are clear allowing for motion artifact. The
liver, gallbladder, spleen, adrenal glands, left kidney, and
pancreas have an unremarkable enhanced appearance. Right kidney is
slightly malrotated. 8 mm right upper pole renal cyst is noted.
Slight prominence of the right renal collecting system is unchanged.
No renal calculi are identified.

Oral contrast is present in small and large bowel to the level of
the transverse colon without evidence of obstruction. The appendix
is identified in the right lower quadrant and is dilated, measuring
1.8 cm in diameter proximally, with mild surrounding inflammatory
change. There is no evidence of perforation or abscess.

Bladder is unremarkable. There are 2 simple appearing left ovarian
cysts, the larger measuring 4.7 cm, likely physiologic. Uterus and
right ovary are unremarkable. No free fluid or enlarged lymph nodes
are identified. Mild S-shaped thoracolumbar scoliosis is again seen.
IMPRESSION: Acute appendicitis.  No evidence of perforation or abscess.

These results were called by telephone at the time of interpretation
on 02/11/2014 at [DATE] to Dr. FELU OLIVA , who verbally
acknowledged these results.

## 2014-02-11 SURGERY — APPENDECTOMY, LAPAROSCOPIC
Anesthesia: General | Site: Abdomen

## 2014-02-11 MED ORDER — FENTANYL CITRATE 0.05 MG/ML IJ SOLN
INTRAMUSCULAR | Status: AC
  Filled 2014-02-11: qty 5

## 2014-02-11 MED ORDER — IOHEXOL 300 MG/ML  SOLN
25.0000 mL | Freq: Once | INTRAMUSCULAR | Status: AC | PRN
  Administered 2014-02-11: 25 mL via ORAL

## 2014-02-11 MED ORDER — NEOSTIGMINE METHYLSULFATE 10 MG/10ML IV SOLN
INTRAVENOUS | Status: DC | PRN
  Administered 2014-02-11: 4 mg via INTRAVENOUS

## 2014-02-11 MED ORDER — LACTATED RINGERS IV SOLN
INTRAVENOUS | Status: DC | PRN
  Administered 2014-02-11 (×2): via INTRAVENOUS

## 2014-02-11 MED ORDER — HYDROMORPHONE HCL 1 MG/ML IJ SOLN
0.2500 mg | INTRAMUSCULAR | Status: DC | PRN
  Administered 2014-02-11 (×3): 0.25 mg via INTRAVENOUS
  Administered 2014-02-11: 0.5 mg via INTRAVENOUS

## 2014-02-11 MED ORDER — BUPIVACAINE-EPINEPHRINE (PF) 0.25% -1:200000 IJ SOLN
INTRAMUSCULAR | Status: AC
  Filled 2014-02-11: qty 30

## 2014-02-11 MED ORDER — SCOPOLAMINE 1 MG/3DAYS TD PT72
MEDICATED_PATCH | TRANSDERMAL | Status: DC | PRN
  Administered 2014-02-11: 1 via TRANSDERMAL

## 2014-02-11 MED ORDER — DIPHENHYDRAMINE HCL 12.5 MG/5ML PO ELIX
12.5000 mg | ORAL_SOLUTION | Freq: Four times a day (QID) | ORAL | Status: DC | PRN
  Filled 2014-02-11: qty 10

## 2014-02-11 MED ORDER — DEXAMETHASONE SODIUM PHOSPHATE 4 MG/ML IJ SOLN
INTRAMUSCULAR | Status: AC
  Filled 2014-02-11: qty 1

## 2014-02-11 MED ORDER — LIDOCAINE HCL (CARDIAC) 20 MG/ML IV SOLN
INTRAVENOUS | Status: DC | PRN
  Administered 2014-02-11: 40 mg via INTRAVENOUS

## 2014-02-11 MED ORDER — DIPHENHYDRAMINE HCL 50 MG/ML IJ SOLN
INTRAMUSCULAR | Status: DC | PRN
  Administered 2014-02-11: 10 mg via INTRAVENOUS

## 2014-02-11 MED ORDER — PHENYLEPHRINE 40 MCG/ML (10ML) SYRINGE FOR IV PUSH (FOR BLOOD PRESSURE SUPPORT)
PREFILLED_SYRINGE | INTRAVENOUS | Status: AC
  Filled 2014-02-11: qty 10

## 2014-02-11 MED ORDER — HYDROMORPHONE HCL 1 MG/ML IJ SOLN
1.0000 mg | Freq: Once | INTRAMUSCULAR | Status: AC
  Administered 2014-02-11: 1 mg via INTRAVENOUS
  Filled 2014-02-11: qty 1

## 2014-02-11 MED ORDER — PANTOPRAZOLE SODIUM 40 MG IV SOLR
40.0000 mg | Freq: Every day | INTRAVENOUS | Status: DC
  Administered 2014-02-11: 40 mg via INTRAVENOUS
  Filled 2014-02-11 (×3): qty 40

## 2014-02-11 MED ORDER — METOCLOPRAMIDE HCL 5 MG/ML IJ SOLN
INTRAMUSCULAR | Status: AC
  Administered 2014-02-11: 10 mg via INTRAVENOUS
  Filled 2014-02-11: qty 2

## 2014-02-11 MED ORDER — ARTIFICIAL TEARS OP OINT
TOPICAL_OINTMENT | OPHTHALMIC | Status: AC
  Filled 2014-02-11: qty 3.5

## 2014-02-11 MED ORDER — DIPHENHYDRAMINE HCL 50 MG/ML IJ SOLN
INTRAMUSCULAR | Status: AC
  Filled 2014-02-11: qty 1

## 2014-02-11 MED ORDER — ROCURONIUM BROMIDE 100 MG/10ML IV SOLN
INTRAVENOUS | Status: DC | PRN
  Administered 2014-02-11: 25 mg via INTRAVENOUS

## 2014-02-11 MED ORDER — DIPHENHYDRAMINE HCL 50 MG/ML IJ SOLN
12.5000 mg | Freq: Four times a day (QID) | INTRAMUSCULAR | Status: DC | PRN
  Filled 2014-02-11: qty 0.5

## 2014-02-11 MED ORDER — DEXAMETHASONE SODIUM PHOSPHATE 4 MG/ML IJ SOLN
INTRAMUSCULAR | Status: DC | PRN
  Administered 2014-02-11: 4 mg via INTRAVENOUS

## 2014-02-11 MED ORDER — METOCLOPRAMIDE HCL 5 MG/ML IJ SOLN
10.0000 mg | Freq: Once | INTRAMUSCULAR | Status: AC
  Administered 2014-02-11: 10 mg via INTRAVENOUS

## 2014-02-11 MED ORDER — POTASSIUM CHLORIDE IN NACL 20-0.9 MEQ/L-% IV SOLN
INTRAVENOUS | Status: DC
  Administered 2014-02-12: 04:00:00 via INTRAVENOUS
  Filled 2014-02-11 (×6): qty 1000

## 2014-02-11 MED ORDER — 0.9 % SODIUM CHLORIDE (POUR BTL) OPTIME
TOPICAL | Status: DC | PRN
  Administered 2014-02-11: 1000 mL

## 2014-02-11 MED ORDER — SUCCINYLCHOLINE CHLORIDE 20 MG/ML IJ SOLN
INTRAMUSCULAR | Status: DC | PRN
  Administered 2014-02-11: 100 mg via INTRAVENOUS

## 2014-02-11 MED ORDER — HYDROMORPHONE HCL 1 MG/ML IJ SOLN
INTRAMUSCULAR | Status: AC
  Administered 2014-02-11: 0.25 mg via INTRAVENOUS
  Filled 2014-02-11: qty 1

## 2014-02-11 MED ORDER — SCOPOLAMINE 1 MG/3DAYS TD PT72
MEDICATED_PATCH | TRANSDERMAL | Status: AC
  Filled 2014-02-11: qty 1

## 2014-02-11 MED ORDER — ONDANSETRON HCL 4 MG/2ML IJ SOLN
INTRAMUSCULAR | Status: AC
  Filled 2014-02-11: qty 2

## 2014-02-11 MED ORDER — GLYCOPYRROLATE 0.2 MG/ML IJ SOLN
INTRAMUSCULAR | Status: AC
  Filled 2014-02-11: qty 3

## 2014-02-11 MED ORDER — LOSARTAN POTASSIUM 50 MG PO TABS
50.0000 mg | ORAL_TABLET | Freq: Every day | ORAL | Status: DC
  Administered 2014-02-12: 50 mg via ORAL
  Filled 2014-02-11: qty 1

## 2014-02-11 MED ORDER — ONDANSETRON HCL 4 MG/2ML IJ SOLN
4.0000 mg | Freq: Once | INTRAMUSCULAR | Status: AC
  Administered 2014-02-11: 4 mg via INTRAVENOUS
  Filled 2014-02-11: qty 2

## 2014-02-11 MED ORDER — EPHEDRINE SULFATE 50 MG/ML IJ SOLN
INTRAMUSCULAR | Status: DC | PRN
  Administered 2014-02-11: 10 mg via INTRAVENOUS

## 2014-02-11 MED ORDER — GLYCOPYRROLATE 0.2 MG/ML IJ SOLN
INTRAMUSCULAR | Status: DC | PRN
  Administered 2014-02-11: 0.6 mg via INTRAVENOUS

## 2014-02-11 MED ORDER — SODIUM CHLORIDE 0.9 % IR SOLN
Status: DC | PRN
  Administered 2014-02-11: 1000 mL

## 2014-02-11 MED ORDER — ROCURONIUM BROMIDE 50 MG/5ML IV SOLN
INTRAVENOUS | Status: AC
  Filled 2014-02-11: qty 1

## 2014-02-11 MED ORDER — SODIUM CHLORIDE 0.9 % IV SOLN
Freq: Once | INTRAVENOUS | Status: AC
  Administered 2014-02-11: 12:00:00 via INTRAVENOUS

## 2014-02-11 MED ORDER — BUPIVACAINE-EPINEPHRINE 0.25% -1:200000 IJ SOLN
INTRAMUSCULAR | Status: DC | PRN
  Administered 2014-02-11: 10 mL

## 2014-02-11 MED ORDER — IOHEXOL 300 MG/ML  SOLN
80.0000 mL | Freq: Once | INTRAMUSCULAR | Status: AC | PRN
  Administered 2014-02-11: 80 mL via INTRAVENOUS

## 2014-02-11 MED ORDER — GLYCOPYRROLATE 0.2 MG/ML IJ SOLN
INTRAMUSCULAR | Status: AC
  Filled 2014-02-11: qty 2

## 2014-02-11 MED ORDER — DEXTROSE 5 % IV SOLN
2.0000 g | Freq: Three times a day (TID) | INTRAVENOUS | Status: DC
  Administered 2014-02-11 – 2014-02-12 (×3): 2 g via INTRAVENOUS
  Filled 2014-02-11 (×7): qty 2

## 2014-02-11 MED ORDER — LOSARTAN POTASSIUM-HCTZ 50-12.5 MG PO TABS: 1.0000 | ORAL_TABLET | Freq: Every day | ORAL | Status: DC

## 2014-02-11 MED ORDER — FENTANYL CITRATE 0.05 MG/ML IJ SOLN
INTRAMUSCULAR | Status: DC | PRN
  Administered 2014-02-11: 100 ug via INTRAVENOUS
  Administered 2014-02-11: 50 ug via INTRAVENOUS
  Administered 2014-02-11: 100 ug via INTRAVENOUS

## 2014-02-11 MED ORDER — HYDROCHLOROTHIAZIDE 12.5 MG PO CAPS
12.5000 mg | ORAL_CAPSULE | Freq: Every day | ORAL | Status: DC
  Administered 2014-02-12: 12.5 mg via ORAL
  Filled 2014-02-11: qty 1

## 2014-02-11 MED ORDER — SUCCINYLCHOLINE CHLORIDE 20 MG/ML IJ SOLN
INTRAMUSCULAR | Status: AC
  Filled 2014-02-11: qty 1

## 2014-02-11 MED ORDER — HYDROMORPHONE HCL 1 MG/ML IJ SOLN
INTRAMUSCULAR | Status: AC
  Filled 2014-02-11: qty 1

## 2014-02-11 MED ORDER — PROPOFOL 10 MG/ML IV BOLUS
INTRAVENOUS | Status: DC | PRN
  Administered 2014-02-11: 30 mg via INTRAVENOUS
  Administered 2014-02-11: 170 mg via INTRAVENOUS

## 2014-02-11 MED ORDER — ONDANSETRON HCL 4 MG/2ML IJ SOLN
INTRAMUSCULAR | Status: DC | PRN
  Administered 2014-02-11: 4 mg via INTRAVENOUS

## 2014-02-11 MED ORDER — DEXTROSE 5 % IV SOLN
INTRAVENOUS | Status: AC
  Filled 2014-02-11: qty 1

## 2014-02-11 MED ORDER — MIDAZOLAM HCL 5 MG/5ML IJ SOLN
INTRAMUSCULAR | Status: DC | PRN
  Administered 2014-02-11: 2 mg via INTRAVENOUS

## 2014-02-11 MED ORDER — MIDAZOLAM HCL 2 MG/2ML IJ SOLN
INTRAMUSCULAR | Status: AC
  Filled 2014-02-11: qty 2

## 2014-02-11 MED ORDER — HYDROMORPHONE HCL 1 MG/ML IJ SOLN
0.5000 mg | INTRAMUSCULAR | Status: DC | PRN
  Administered 2014-02-12: 0.5 mg via INTRAVENOUS
  Filled 2014-02-11: qty 1

## 2014-02-11 MED ORDER — EPHEDRINE SULFATE 50 MG/ML IJ SOLN
INTRAMUSCULAR | Status: AC
  Filled 2014-02-11: qty 1

## 2014-02-11 MED ORDER — PROPOFOL 10 MG/ML IV BOLUS
INTRAVENOUS | Status: AC
  Filled 2014-02-11: qty 20

## 2014-02-11 MED ORDER — HYDROCODONE-ACETAMINOPHEN 5-325 MG PO TABS
1.0000 | ORAL_TABLET | ORAL | Status: DC | PRN
  Administered 2014-02-12 (×3): 2 via ORAL
  Filled 2014-02-11 (×3): qty 2

## 2014-02-11 SURGICAL SUPPLY — 43 items
APPLIER CLIP 5 13 M/L LIGAMAX5 (MISCELLANEOUS) ×2
APPLIER CLIP ROT 10 11.4 M/L (STAPLE)
BLADE SURG ROTATE 9660 (MISCELLANEOUS) IMPLANT
CANISTER SUCTION 2500CC (MISCELLANEOUS) ×2 IMPLANT
CHLORAPREP W/TINT 26ML (MISCELLANEOUS) ×2 IMPLANT
CLIP APPLIE 5 13 M/L LIGAMAX5 (MISCELLANEOUS) ×1 IMPLANT
CLIP APPLIE ROT 10 11.4 M/L (STAPLE) IMPLANT
COVER SURGICAL LIGHT HANDLE (MISCELLANEOUS) ×2 IMPLANT
CUTTER FLEX LINEAR 45M (STAPLE) ×2 IMPLANT
DERMABOND ADVANCED (GAUZE/BANDAGES/DRESSINGS) ×1
DERMABOND ADVANCED .7 DNX12 (GAUZE/BANDAGES/DRESSINGS) ×1 IMPLANT
DRAPE UTILITY 15X26 W/TAPE STR (DRAPE) ×4 IMPLANT
ELECT REM PT RETURN 9FT ADLT (ELECTROSURGICAL) ×2
ELECTRODE REM PT RTRN 9FT ADLT (ELECTROSURGICAL) ×1 IMPLANT
ENDOLOOP SUT PDS II  0 18 (SUTURE)
ENDOLOOP SUT PDS II 0 18 (SUTURE) IMPLANT
GLOVE BIO SURGEON STRL SZ7.5 (GLOVE) ×2 IMPLANT
GLOVE BIOGEL PI IND STRL 6.5 (GLOVE) ×1 IMPLANT
GLOVE BIOGEL PI IND STRL 7.0 (GLOVE) ×1 IMPLANT
GLOVE BIOGEL PI IND STRL 7.5 (GLOVE) ×1 IMPLANT
GLOVE BIOGEL PI INDICATOR 6.5 (GLOVE) ×1
GLOVE BIOGEL PI INDICATOR 7.0 (GLOVE) ×1
GLOVE BIOGEL PI INDICATOR 7.5 (GLOVE) ×1
GOWN STRL REUS W/ TWL LRG LVL3 (GOWN DISPOSABLE) ×3 IMPLANT
GOWN STRL REUS W/TWL LRG LVL3 (GOWN DISPOSABLE) ×3
KIT BASIN OR (CUSTOM PROCEDURE TRAY) ×2 IMPLANT
KIT ROOM TURNOVER OR (KITS) ×2 IMPLANT
NS IRRIG 1000ML POUR BTL (IV SOLUTION) ×2 IMPLANT
PAD ARMBOARD 7.5X6 YLW CONV (MISCELLANEOUS) ×4 IMPLANT
POUCH SPECIMEN RETRIEVAL 10MM (ENDOMECHANICALS) ×2 IMPLANT
RELOAD STAPLE TA45 3.5 REG BLU (ENDOMECHANICALS) ×4 IMPLANT
SCALPEL HARMONIC ACE (MISCELLANEOUS) ×2 IMPLANT
SCISSORS LAP 5X35 DISP (ENDOMECHANICALS) ×2 IMPLANT
SET IRRIG TUBING LAPAROSCOPIC (IRRIGATION / IRRIGATOR) ×2 IMPLANT
SPECIMEN JAR SMALL (MISCELLANEOUS) ×2 IMPLANT
SUT MNCRL AB 4-0 PS2 18 (SUTURE) ×2 IMPLANT
SUT VICRYL 0 UR6 27IN ABS (SUTURE) ×2 IMPLANT
TOWEL OR 17X24 6PK STRL BLUE (TOWEL DISPOSABLE) ×2 IMPLANT
TOWEL OR 17X26 10 PK STRL BLUE (TOWEL DISPOSABLE) ×2 IMPLANT
TRAY FOLEY CATH 16FR SILVER (SET/KITS/TRAYS/PACK) ×2 IMPLANT
TRAY LAPAROSCOPIC (CUSTOM PROCEDURE TRAY) ×2 IMPLANT
TROCAR XCEL BLUNT TIP 100MML (ENDOMECHANICALS) ×2 IMPLANT
TROCAR XCEL NON-BLD 5MMX100MML (ENDOMECHANICALS) ×4 IMPLANT

## 2014-02-11 NOTE — ED Provider Notes (Signed)
Lisa Novak is a 43 y.o. female who presents to Urgent Care today for right lower corner and abdominal pain. Patient is a three-day history of moderate to severe right lower corner pain. The pain radiates from her right back to her right pelvis. She notes this is associated with diarrhea. She denies any vomiting fevers or chills. She notes the pain is somewhat consistent with prior episode of kidney stone occurring when she was 43 years old. Pain has worsened over the past 3 days.   Her abdominal surgery history significant for umbilical hernia repair Abdominoplasty, and partial hysterectomy. She denies any history of appendectomy.   Past Medical History  Diagnosis Date  . Hypertension    History  Substance Use Topics  . Smoking status: Never Smoker   . Smokeless tobacco: Not on file  . Alcohol Use: No   ROS as above Medications: No current facility-administered medications for this encounter.   Current Outpatient Prescriptions  Medication Sig Dispense Refill  . losartan-hydrochlorothiazide (HYZAAR) 50-12.5 MG per tablet Take 1 tablet by mouth daily.        Exam:  BP 128/90  Pulse 88  Temp(Src) 98.2 F (36.8 C) (Oral)  Resp 16  SpO2 98% Gen: Well NAD HEENT: EOMI,  MMM Lungs: Normal work of breathing. CTABL Heart: RRR no MRG Abd: NABS, significant tenderness to palpation right lower corner with guarding and rebound. Exts: Brisk capillary refill, warm and well perfused.   Results for orders placed during the hospital encounter of 02/11/14 (from the past 24 hour(s))  POCT URINALYSIS DIP (DEVICE)     Status: None   Collection Time    02/11/14  9:28 AM      Result Value Ref Range   Glucose, UA NEGATIVE  NEGATIVE mg/dL   Bilirubin Urine NEGATIVE  NEGATIVE   Ketones, ur NEGATIVE  NEGATIVE mg/dL   Specific Gravity, Urine 1.020  1.005 - 1.030   Hgb urine dipstick NEGATIVE  NEGATIVE   pH 7.0  5.0 - 8.0   Protein, ur NEGATIVE  NEGATIVE mg/dL   Urobilinogen, UA 0.2  0.0 - 1.0  mg/dL   Nitrite NEGATIVE  NEGATIVE   Leukocytes, UA NEGATIVE  NEGATIVE  POCT PREGNANCY, URINE     Status: None   Collection Time    02/11/14  9:31 AM      Result Value Ref Range   Preg Test, Ur NEGATIVE  NEGATIVE   No results found.  Assessment and Plan: 43 y.o. female with right lower quadrant abdominal pain associated with diarrhea. This is concerning for appendicitis. Transfer patient to the emergency department via shuttle for further evaluation and management.  Discussed warning signs or symptoms. Please see discharge instructions. Patient expresses understanding.  Gregor Hams, MD 02/11/14 [phone]

## 2014-02-11 NOTE — Transfer of Care (Signed)
Immediate Anesthesia Transfer of Care Note  Patient: Lisa Novak  Procedure(s) Performed: Procedure(s): APPENDECTOMY LAPAROSCOPIC (N/A)  Patient Location: PACU  Anesthesia Type:General  Level of Consciousness: sedated  Airway & Oxygen Therapy: Patient Spontanous Breathing and Patient connected to face mask oxygen  Post-op Assessment: Report given to PACU RN and Post -op Vital signs reviewed and stable  Post vital signs: Reviewed and stable  Complications: No apparent anesthesia complications

## 2014-02-11 NOTE — ED Notes (Signed)
Patient transported to CT 

## 2014-02-11 NOTE — ED Provider Notes (Signed)
CSN: [id number]     Arrival date & time 02/11/14  9326 History   First MD Initiated Contact with Patient 02/11/14 1024     Chief Complaint  Patient presents with  . Abdominal Pain    urgent care rule out appy      (Consider location/radiation/quality/duration/timing/severity/associated sxs/prior Treatment) HPI Comments: The patient presents to the ER for evaluation of abdominal pain. Patient is complaining of progressively worsening right lower quadrant abdominal pain for 3 days. Pain is progressively worsening. She has not documented a fever, but has had chills and sweats. Patient reports nausea and diarrhea associated with the symptoms. She has had decreased appetite.  She presented to urgent care this morning and was referred to the ER to evaluate appendix.  Patient is a 43 y.o. female presenting with abdominal pain.  Abdominal Pain Associated symptoms: diarrhea     Past Medical History  Diagnosis Date  . Hypertension    Past Surgical History  Procedure Laterality Date  . Abdominal hysterectomy    . Hernia repair     No family history on file. History  Substance Use Topics  . Smoking status: Never Smoker   . Smokeless tobacco: Not on file  . Alcohol Use: No   OB History   Grav Para Term Preterm Abortions TAB SAB Ect Mult Living                 Review of Systems  Gastrointestinal: Positive for abdominal pain and diarrhea.  All other systems reviewed and are negative.     Allergies  Review of patient's allergies indicates no known allergies.  Home Medications   Prior to Admission medications   Medication Sig Start Date End Date Taking? Authorizing Provider  losartan-hydrochlorothiazide (HYZAAR) 50-12.5 MG per tablet Take 1 tablet by mouth daily.    Historical Provider, MD   BP 129/84  Pulse 88  Temp(Src) 98.2 F (36.8 C) (Oral)  Resp 19  Ht 5' 4.5" (1.638 m)  Wt 142 lb (64.411 kg)  BMI 24.01 kg/m2  SpO2 94% Physical Exam  Constitutional: She is  oriented to person, place, and time. She appears well-developed and well-nourished. No distress.  HENT:  Head: Normocephalic and atraumatic.  Right Ear: Hearing normal.  Left Ear: Hearing normal.  Nose: Nose normal.  Mouth/Throat: Oropharynx is clear and moist and mucous membranes are normal.  Eyes: Conjunctivae and EOM are normal. Pupils are equal, round, and reactive to light.  Neck: Normal range of motion. Neck supple.  Cardiovascular: Regular rhythm, S1 normal and S2 normal.  Exam reveals no gallop and no friction rub.   No murmur heard. Pulmonary/Chest: Effort normal and breath sounds normal. No respiratory distress. She exhibits no tenderness.  Abdominal: Soft. Normal appearance and bowel sounds are normal. There is no hepatosplenomegaly. There is tenderness (positive Rovsing) in the right lower quadrant. There is rebound and guarding. There is no tenderness at McBurney's point and negative Murphy's sign. No hernia.  Positive Rovsing  Musculoskeletal: Normal range of motion.  Neurological: She is alert and oriented to person, place, and time. She has normal strength. No cranial nerve deficit or sensory deficit. Coordination normal. GCS eye subscore is 4. GCS verbal subscore is 5. GCS motor subscore is 6.  Skin: Skin is warm, dry and intact. No rash noted. No cyanosis.  Psychiatric: She has a normal mood and affect. Her speech is normal and behavior is normal. Thought content normal.    ED Course  Procedures (including critical care time)  Labs Review Labs Reviewed  CBC WITH DIFFERENTIAL  BASIC METABOLIC PANEL  URINALYSIS, ROUTINE W REFLEX MICROSCOPIC    Imaging Review No results found.   EKG Interpretation None      MDM   Final diagnoses:  None   appendicitis   As the ER for evaluation of three-day history of progressively worsening right lower quadrant pain. Patient seen this morning at urgent care, concern for appendicitis resulting in her transfer to the ER.  Examination was concerning for acute appendicitis. Patient had positive Rovsing, positive guarding, positive rebound. CAT scan did confirm acute appendicitis with a competition. General surgery consult for definitive care.    Orpah Greek, MD 02/11/14 1356

## 2014-02-11 NOTE — Anesthesia Preprocedure Evaluation (Addendum)
Anesthesia Evaluation  Patient identified by MRN, date of birth, ID band Patient awake    Reviewed: Allergy & Precautions, H&P , NPO status   Airway Mallampati: II TM Distance: >3 FB Neck ROM: Full    Dental  (+) Teeth Intact, Dental Advisory Given   Pulmonary          Cardiovascular hypertension, Pt. on medications Rhythm:Regular Rate:Normal     Neuro/Psych    GI/Hepatic GERD-  Medicated and Controlled,  Endo/Other  diabetes, Well Controlled, Type 2  Renal/GU      Musculoskeletal   Abdominal   Peds  Hematology   Anesthesia Other Findings   Reproductive/Obstetrics                          Anesthesia Physical Anesthesia Plan  ASA: III  Anesthesia Plan: General   Post-op Pain Management:    Induction: Intravenous  Airway Management Planned: Oral ETT  Additional Equipment:   Intra-op Plan:   Post-operative Plan: Extubation in OR  Informed Consent: I have reviewed the patients History and Physical, chart, labs and discussed the procedure including the risks, benefits and alternatives for the proposed anesthesia with the patient or authorized representative who has indicated his/her understanding and acceptance.   Dental advisory given  Plan Discussed with: Anesthesiologist and CRNA  Anesthesia Plan Comments:         Anesthesia Quick Evaluation

## 2014-02-11 NOTE — Anesthesia Procedure Notes (Signed)
Procedure Name: Intubation Date/Time: 02/11/2014 6:13 PM Performed by: Sampson Si E Pre-anesthesia Checklist: Patient identified, Emergency Drugs available, Suction available, Patient being monitored and Timeout performed Patient Re-evaluated:Patient Re-evaluated prior to inductionOxygen Delivery Method: Circle system utilized Preoxygenation: Pre-oxygenation with 100% oxygen Intubation Type: IV induction, Rapid sequence and Cricoid Pressure applied Laryngoscope Size: Mac and 3 Grade View: Grade I Tube type: Oral Tube size: 7.0 mm Number of attempts: 1 Airway Equipment and Method: Stylet Placement Confirmation: ETT inserted through vocal cords under direct vision,  positive ETCO2 and breath sounds checked- equal and bilateral Secured at: 21 cm Tube secured with: Tape Dental Injury: Teeth and Oropharynx as per pre-operative assessment

## 2014-02-11 NOTE — ED Notes (Signed)
C/o  Lower right abdomen/flank pain that radiates through to back.  Pain with palpation.  Nausea.  Diarrhea. Sweats.   On 3 to 4 days ago and gradually getting worse.   otc meds taken.

## 2014-02-11 NOTE — H&P (Signed)
[facility] Admission Note  Lisa Novak Dec 09, 1970  [id number].    Requesting MD: Dr. Betsey Holiday Chief Complaint/Reason for Consult: Acute appendicitis  HPI:  43 y/o white female who presented to [facility] with 3 days of progressive RLQ abdominal pain.  She had gone to urgent care and was sent here due to concern for appendicitis.  She says the pain suddenly worsened at 2:30am this morning (02/11/14).  She c/o dull/achy RLQ pain, anorexia, nausea without vomiting, uncontrollable diarrhea, fatigue, malaise, feverish, and chills.  No CP/SOB, dysuria, headache.  She's never had this pain before.  No radiating pain, no alleviating/aggrevating factors.  H/o open umbilical hernia repair with abdominoplasty, h/o lap hysterectomy.  Also notes h/o diverticulitis and GERD.    ROS: All systems reviewed and otherwise negative except for as above  No family history on file.  Past Medical History  Diagnosis Date  . Hypertension   . GERD (gastroesophageal reflux disease)   . Umbilical hernia   . Diabetes type 2, controlled     Past Surgical History  Procedure Laterality Date  . Abdominal hysterectomy    . Hernia repair    . Abdominoplasty      Social History:  reports that she has never smoked. She does not have any smokeless tobacco history on file. She reports that she does not drink alcohol or use illicit drugs.  Allergies: No Known Allergies   (Not in a hospital admission)  Blood pressure 115/72, pulse 88, temperature 98 F (36.7 C), temperature source Oral, resp. rate 14, height 5' 4.5" (1.638 m), weight 142 lb (64.411 kg), SpO2 99.00%. Physical Exam: General: pleasant, WD/WN white female who is laying in bed in NAD HEENT: head is normocephalic, atraumatic.  Sclera are noninjected.  PERRL.  Ears and nose without any masses or lesions.  Mouth is pink and moist Heart: regular, rate, and rhythm.  No obvious murmurs, gallops, or rubs noted.  Palpable pedal pulses  bilaterally Lungs: CTAB, no wheezes, rhonchi, or rales noted.  Respiratory effort nonlabored Abd: soft, ND, tender in RLQ at Mcburney's point, + Rovsing sign, +BS, no masses, hernias, or organomegaly, low horizontal scar from abdominoplasty, well healed periumbilical scar, multiple well healed laparoscopic scars MS: all 4 extremities are symmetrical with no cyanosis, clubbing, or edema. Skin: warm and dry with no masses, lesions, or rashes Psych: A&Ox3 with an appropriate affect.   Results for orders placed during the hospital encounter of 02/11/14 (from the past 48 hour(s))  CBC WITH DIFFERENTIAL     Status: Abnormal   Collection Time    02/11/14 10:35 AM      Result Value Ref Range   WBC 9.9  4.0 - 10.5 K/uL   RBC 4.47  3.87 - 5.11 MIL/uL   Hemoglobin 13.8  12.0 - 15.0 g/dL   HCT 39.9  36.0 - 46.0 %   MCV 89.3  78.0 - 100.0 fL   MCH 30.9  26.0 - 34.0 pg   MCHC 34.6  30.0 - 36.0 g/dL   RDW 13.5  11.5 - 15.5 %   Platelets 218  150 - 400 K/uL   Neutrophils Relative % 80 (*) 43 - 77 %   Neutro Abs 7.9 (*) 1.7 - 7.7 K/uL   Lymphocytes Relative 12  12 - 46 %   Lymphs Abs 1.2  0.7 - 4.0 K/uL   Monocytes Relative 7  3 - 12 %   Monocytes Absolute 0.7  0.1 - 1.0 K/uL   Eosinophils Relative  1  0 - 5 %   Eosinophils Absolute 0.1  0.0 - 0.7 K/uL   Basophils Relative 0  0 - 1 %   Basophils Absolute 0.0  0.0 - 0.1 K/uL  BASIC METABOLIC PANEL     Status: Abnormal   Collection Time    02/11/14 10:35 AM      Result Value Ref Range   Sodium 140  137 - 147 mEq/L   Potassium 3.6 (*) 3.7 - 5.3 mEq/L   Chloride 101  96 - 112 mEq/L   CO2 26  19 - 32 mEq/L   Glucose, Bld 85  70 - 99 mg/dL   BUN 10  6 - 23 mg/dL   Creatinine, Ser 0.78  0.50 - 1.10 mg/dL   Calcium 9.5  8.4 - 10.5 mg/dL   GFR calc non Af Amer >90  >90 mL/min   GFR calc Af Amer >90  >90 mL/min   Comment: (NOTE)     The eGFR has been calculated using the CKD EPI equation.     This calculation has not been validated in all  clinical situations.     eGFR's persistently <90 mL/min signify possible Chronic Kidney     Disease.   Anion gap 13  5 - 15   Ct Abdomen Pelvis W Contrast  02/11/2014   CLINICAL DATA:  Right lower quadrant abdominal pain for 3 days. Evaluate for appendicitis.  EXAM: CT ABDOMEN AND PELVIS WITH CONTRAST  TECHNIQUE: Multidetector CT imaging of the abdomen and pelvis was performed using the standard protocol following bolus administration of intravenous contrast.  CONTRAST:  62m OMNIPAQUE IOHEXOL 300 MG/ML SOLN, 884mOMNIPAQUE IOHEXOL 300 MG/ML SOLN  COMPARISON:  10/21/2012  FINDINGS: Visualized lung bases are clear allowing for motion artifact. The liver, gallbladder, spleen, adrenal glands, left kidney, and pancreas have an unremarkable enhanced appearance. Right kidney is slightly malrotated. 8 mm right upper pole renal cyst is noted. Slight prominence of the right renal collecting system is unchanged. No renal calculi are identified.  Oral contrast is present in small and large bowel to the level of the transverse colon without evidence of obstruction. The appendix is identified in the right lower quadrant and is dilated, measuring 1.8 cm in diameter proximally, with mild surrounding inflammatory change. There is no evidence of perforation or abscess.  Bladder is unremarkable. There are 2 simple appearing left ovarian cysts, the larger measuring 4.7 cm, likely physiologic. Uterus and right ovary are unremarkable. No free fluid or enlarged lymph nodes are identified. Mild S-shaped thoracolumbar scoliosis is again seen.  IMPRESSION: Acute appendicitis.  No evidence of perforation or abscess.  These results were called by telephone at the time of interpretation on 02/11/2014 at 1:24 pm to Dr. CHJoseph Berkshire who verbally acknowledged these results.   Electronically Signed   By: AlLogan Bores On: 02/11/2014 13:24      Assessment/Plan Acute appendicitis  Plan: 1.  Admit to [facility], plan for urgent lap  appy 2.  NPO, bowel rest, IVF, pain control, antiemetics, antibiotics (cefoxitin) 3.  No elevated WBC count, no evidence of perforation on CT.  Discussed risks/benefits with the patient and she would like to proceed with surgery today 4.  Dr. Toth/Dr. WaDonne Hazelo see the patient soon.   DOCoralie KeensPAAvera De Smet Memorial Hospitalurgery 02/11/2014, 2:16 PM Pager: [phone]

## 2014-02-11 NOTE — ED Notes (Signed)
Pt transferred here from urgent care to rule out appendicitis.

## 2014-02-11 NOTE — Op Note (Signed)
02/11/2014  7:44 PM  PATIENT:  Lisa Novak  43 y.o. female  PRE-OPERATIVE DIAGNOSIS:  Acute appendicitis  POST-OPERATIVE DIAGNOSIS:  acute appendicitis  PROCEDURE:  Procedure(s): APPENDECTOMY LAPAROSCOPIC (N/A)  SURGEON:  Surgeon(s) and Role:    * Jovita Kussmaul, MD - Primary  PHYSICIAN ASSISTANT:   ASSISTANTS: none   ANESTHESIA:   general  EBL:     BLOOD ADMINISTERED:none  DRAINS: none   LOCAL MEDICATIONS USED:  MARCAINE     SPECIMEN:  Source of Specimen:  appendix  DISPOSITION OF SPECIMEN:  PATHOLOGY  COUNTS:  YES  TOURNIQUET:  * No tourniquets in log *  DICTATION: .Dragon Dictation After informed consent was obtained patient was brought to the operating room placed in the supine position on the operating room table. After adequate induction of general anesthesia the patient's abdomen was prepped with ChloraPrep, allowed to dry, and draped in usual sterile manner. The area below the umbilicus was infiltrated with quarter percent Marcaine. A small incision was made with a 15 blade knife. This incision was carried down through the subcutaneous tissue bluntly with a hemostat and Army-Navy retractors until the linea alba was identified. The linea alba was incised with a 15 blade knife. Each side was grasped Coker clamps and elevated anteriorly. The preperitoneal space was probed bluntly with a hemostat until the peritoneum was opened and access was gained to the abdominal cavity. A 0 Vicryl purse string stitch was placed in the fascia surrounding the opening. A Hassan cannula was placed through the opening and anchored in place with the previously placed Vicryl purse string stitch. The laparoscope was placed through the [facility] cannula. The abdomen was insufflated with carbon dioxide without difficulty. Next the suprapubic area was infiltrated with quarter percent Marcaine. A small incision was made with a 15 blade knife. A 5 mm port was placed bluntly through this incision into  the abdominal cavity. A site was then chosen between the 2 port for placement of a 5 mm port. The area was infiltrated with quarter percent Marcaine. A small stab incision was made with a 15 blade knife. A 5 mm port was placed bluntly through this incision and the abdominal cavity under direct vision. The laparoscope was then moved to the suprapubic port. Using a Glassman grasper and harmonic scalpel the right lower quadrant was inspected. The appendix was readily identified. The appendix was elevated anteriorly and the mesoappendix was taken down sharply with the harmonic scalpel. Once the base of the appendix where it joined the cecum was identified and cleared of any tissue then a laparoscopic GIA blue load 6 row stapler was placed through the [facility] cannula. The stapler was placed across the base of the appendix clamped and fired thereby dividing the base of the appendix between staple lines. A laparoscopic bag was then inserted through the [facility] cannula. The appendix was placed within the bag and the bag was sealed. The abdomen was then irrigated with copious amounts of saline until the effluent was clear. No other abnormalities were noted. The appendix and bag were removed with the [facility] cannula through the infraumbilical port without difficulty. The fascial defect was closed with the previously placed Vicryl pursestring stitch as well as with another interrupted 0 Vicryl figure-of-eight stitch. The rest of the ports were removed under direct vision and were found to be hemostatic. The gas was allowed to escape. The skin incisions were closed with interrupted 4-0 Monocryl subcuticular stitches. Dermabond dressings were applied. The patient tolerated the procedure  well. At the end of the case all needle sponge and instrument counts were correct. The patient was then awakened and taken to recovery in stable condition.  PLAN OF CARE: Admit for overnight observation  PATIENT DISPOSITION:  PACU -  hemodynamically stable.   Delay start of Pharmacological VTE agent (>24hrs) due to surgical blood loss or risk of bleeding: yes

## 2014-02-11 NOTE — Anesthesia Postprocedure Evaluation (Signed)
  Anesthesia Post-op Note  Patient: Lisa Novak  Procedure(s) Performed: Procedure(s): APPENDECTOMY LAPAROSCOPIC (N/A)  Patient Location: PACU  Anesthesia Type:General  Level of Consciousness: awake, alert  and oriented  Airway and Oxygen Therapy: Patient Spontanous Breathing and Patient connected to nasal cannula oxygen  Post-op Pain: mild  Post-op Assessment: Post-op Vital signs reviewed, Patient's Cardiovascular Status Stable, Respiratory Function Stable, Patent Airway and Pain level controlled  Post-op Vital Signs: stable  Last Vitals:  Filed Vitals:   02/11/14 2202  BP: 126/83  Pulse: 91  Temp: 36.6 C  Resp: 19    Complications: No apparent anesthesia complications

## 2014-02-11 NOTE — H&P (Signed)
Discussed lap appy today, may be me or dr toth later today

## 2014-02-12 MED ORDER — HYDROCODONE-ACETAMINOPHEN 5-325 MG PO TABS: 1.0000 | ORAL_TABLET | ORAL | Status: DC | PRN

## 2014-02-12 NOTE — Progress Notes (Signed)
IV removed by NT.  Pt discharged to home with family via volunteer w/c services with no complaints and all belongings Syliva Overman

## 2014-02-12 NOTE — Progress Notes (Signed)
Discharge instructions and prescription for vicodin/work notes given and explained to pt.  Pt verbalized understanding of all orders/instructions.  VSS. Pt will leave after lunch. Syliva Overman

## 2014-02-12 NOTE — Discharge Instructions (Signed)
[facility] ______[facility] °LAPAROSCOPIC SURGERY: POST OP INSTRUCTIONS °Always review your discharge instruction sheet given to you by the facility where your surgery was performed. °IF YOU HAVE DISABILITY OR FAMILY LEAVE FORMS, YOU MUST BRING THEM TO THE OFFICE FOR PROCESSING.   °DO NOT GIVE THEM TO YOUR DOCTOR. ° °1. A prescription for pain medication may be given to you upon discharge.  Take your pain medication as prescribed, if needed.  If narcotic pain medicine is not needed, then you may take acetaminophen (Tylenol) or ibuprofen (Advil) as needed. °2. Take your usually prescribed medications unless otherwise directed. °3. If you need a refill on your pain medication, please contact your pharmacy.  They will contact our office to request authorization. Prescriptions will not be filled after 5pm or on week-ends. °4. You should follow a light diet the first few days after arrival home, such as soup and crackers, etc.  Be sure to include lots of fluids daily. °5. Most patients will experience some swelling and bruising in the area of the incisions.  Ice packs will help.  Swelling and bruising can take several days to resolve.  °6. It is common to experience some constipation if taking pain medication after surgery.  Increasing fluid intake and taking a stool softener (such as Colace) will usually help or prevent this problem from occurring.  A mild laxative (Milk of Magnesia or Miralax) should be taken according to package instructions if there are no bowel movements after 48 hours. °7. Unless discharge instructions indicate otherwise, you may remove your bandages 24-48 hours after surgery, and you may shower at that time.  You may have steri-strips (small skin tapes) in place directly over the incision.  These strips should be left on the skin for 7-10 days.  If your surgeon used skin glue on the incision, you may shower in 24 hours.  The glue will flake off over the next 2-3 weeks.  Any sutures or  staples will be removed at the office during your follow-up visit. °8. ACTIVITIES:  You may resume regular (light) daily activities beginning the next day--such as daily self-care, walking, climbing stairs--gradually increasing activities as tolerated.  You may have sexual intercourse when it is comfortable.  Refrain from any heavy lifting or straining until approved by your doctor. °a. You may drive when you are no longer taking prescription pain medication, you can comfortably wear a seatbelt, and you can safely maneuver your car and apply brakes. °b. RETURN TO WORK:  __________________________________________________________ °9. You should see your doctor in the office for a follow-up appointment approximately 2-3 weeks after your surgery.  Make sure that you call for this appointment within a day or two after you arrive home to insure a convenient appointment time. °10. OTHER INSTRUCTIONS: __________________________________________________________________________________________________________________________ __________________________________________________________________________________________________________________________ °WHEN TO CALL YOUR DOCTOR: °1. Fever over 101.0 °2. Inability to urinate °3. Continued bleeding from incision. °4. Increased pain, redness, or drainage from the incision. °5. Increasing abdominal pain ° °The clinic staff is available to answer your questions during regular business hours.  Please don’t hesitate to call and ask to speak to one of the nurses for clinical concerns.  If you have a medical emergency, go to the nearest emergency room or call 911.  A surgeon from [facility] is always on call at the hospital. °[address], [address], [place], [place]  [address] ? [address] [address], [place], [place]   [address] °[phone] ? [phone] ? FAX [phone] °Web site:   [email or web address] °

## 2014-02-12 NOTE — Progress Notes (Signed)
Utilization Review Completed.[name] T9/18/2015  

## 2014-02-12 NOTE — Discharge Summary (Signed)
Patient ID: Lisa Novak MRN: [id number] DOB/AGE: Mar 27, 1971 43 y.o.  Admit date: 02/11/2014 Discharge date: 02/12/2014  Procedures: lap appy  Consults: None  Reason for Admission: 43 y/o white female who presented to [facility] with 3 days of progressive RLQ abdominal pain. She had gone to urgent care and was sent here due to concern for appendicitis. She says the pain suddenly worsened at 2:30am this morning (02/11/14). She c/o dull/achy RLQ pain, anorexia, nausea without vomiting, uncontrollable diarrhea, fatigue, malaise, feverish, and chills. No CP/SOB, dysuria, headache. She's never had this pain before. No radiating pain, no alleviating/aggrevating factors. H/o open umbilical hernia repair with abdominoplasty, h/o lap hysterectomy. Also notes h/o diverticulitis and GERD.   Admission Diagnoses:  1. Acute appendicitis  Hospital Course: the patient was admitted and taken to the [place] where she underwent a lap appy.  She was noted to have some thin walled ovarian cyst as well.  She tolerated this procedure well.  On POD 1, she was tolerating a regular diet and her pain was well controlled.  She was felt stable for dc home.  PE: Abd: soft, appropriately tender, +BS, ND, incisions c/d/i  Discharge Diagnoses:  Active Problems:   Appendicitis, acute ovarian cysts  Discharge Medications:   Medication List         calcium carbonate 500 MG chewable tablet  Commonly known as:  TUMS - dosed in mg elemental calcium  Chew 2 tablets by mouth daily as needed for indigestion or heartburn.     HYDROcodone-acetaminophen 5-325 MG per tablet  Commonly known as:  NORCO/VICODIN  Take 1-2 tablets by mouth every 4 (four) hours as needed for moderate pain or severe pain.     losartan-hydrochlorothiazide 50-12.5 MG per tablet  Commonly known as:  HYZAAR  Take 1 tablet by mouth daily.     phentermine 37.5 MG capsule  Take 37.5 mg by mouth every morning.     ranitidine 150 MG tablet  Commonly known as:   ZANTAC  Take 150 mg by mouth daily.        Discharge Instructions:     Follow-up Information   Follow up with [facility] [place] On 03/09/2014. (2:45pm, arrive by 2:15pm for paperwork.  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 (for ovarian cyst)       Signed: Arneisha Kincannon E 02/12/2014, 11:24 AM

## 2014-02-14 NOTE — Discharge Summary (Signed)
Agree with above 

## 2014-02-15 ENCOUNTER — Encounter (HOSPITAL_COMMUNITY): Payer: Self-pay | Admitting: General Surgery

## 2014-04-06 ENCOUNTER — Other Ambulatory Visit: Payer: Self-pay | Admitting: Obstetrics & Gynecology

## 2014-04-08 NOTE — Patient Instructions (Addendum)
   Your procedure is scheduled on:  Tuesday, Nov 17  Enter through the [employer] [facility] at: 11:30 AM Pick up the phone at the desk and dial [phone] and inform us of your arrival.  Please call this number if you have any problems the morning of surgery: [phone]  Remember: Do not eat food after midnight: Monday Do not drink clear liquids after: 6 AM Tuesday, day of surgery Take these medicines the morning of surgery with a SIP OF WATER:  hyzaar and zantac  Do not wear jewelry, make-up, or FINGER nail polish No metal in your hair or on your body. Do not wear lotions, powders, perfumes.  You may wear deodorant.  Do not bring valuables to the hospital. Contacts, dentures or bridgework may not be worn into surgery.  Leave suitcase in the car. After Surgery it may be brought to your room. For patients being admitted to the hospital, checkout time is 11:00am the day of discharge.    Patients discharged on the day of surgery will not be allowed to drive home.  Home with husbnad Lennette Bihari cell [phone]

## 2014-04-09 ENCOUNTER — Encounter (HOSPITAL_COMMUNITY)
Admission: RE | Admit: 2014-04-09 | Discharge: 2014-04-09 | Disposition: A | Discharge status: Home / Self Care | Payer: No Typology Code available for payment source | Source: Ambulatory Visit | Attending: Obstetrics & Gynecology | Admitting: Obstetrics & Gynecology

## 2014-04-09 ENCOUNTER — Encounter (HOSPITAL_COMMUNITY): Payer: Self-pay

## 2014-04-09 DIAGNOSIS — Z01812 Encounter for preprocedural laboratory examination: Secondary | ICD-10-CM | POA: Diagnosis not present

## 2014-04-09 HISTORY — DX: Nausea with vomiting, unspecified: R11.2

## 2014-04-09 HISTORY — DX: Other specified postprocedural states: Z98.890

## 2014-04-09 LAB — BASIC METABOLIC PANEL
Anion gap: 9 (ref 5–15)
BUN: 12 mg/dL (ref 6–23)
CALCIUM: 9.2 mg/dL (ref 8.4–10.5)
CHLORIDE: 101 meq/L (ref 96–112)
CO2: 27 meq/L (ref 19–32)
Creatinine, Ser: 0.8 mg/dL (ref 0.50–1.10)
GFR calc Af Amer: >90 mL/min (ref >90)
GFR calc non Af Amer: 89 mL/min — ABNORMAL LOW (ref >90)
GLUCOSE: 81 mg/dL (ref 70–99)
POTASSIUM: 3.7 meq/L (ref 3.7–5.3)
Sodium: 137 mEq/L (ref 137–147)

## 2014-04-09 LAB — CBC
HCT: 40.1 % (ref 36.0–46.0)
HEMOGLOBIN: 14 g/dL (ref 12.0–15.0)
MCH: 31.6 pg (ref 26.0–34.0)
MCHC: 34.9 g/dL (ref 30.0–36.0)
MCV: 90.5 fL (ref 78.0–100.0)
Platelets: 224 10*3/uL (ref 150–400)
RBC: 4.43 MIL/uL (ref 3.87–5.11)
RDW: 12.7 % (ref 11.5–15.5)
WBC: 5.1 10*3/uL (ref 4.0–10.5)

## 2014-04-09 LAB — TYPE AND SCREEN
ABO/RH(D): A POS
ANTIBODY SCREEN: NEGATIVE

## 2014-04-09 LAB — ABO/RH: ABO/RH(D): A POS

## 2014-04-13 ENCOUNTER — Encounter (HOSPITAL_COMMUNITY): Payer: Self-pay

## 2014-04-13 ENCOUNTER — Encounter (HOSPITAL_COMMUNITY)
Admission: RE | Disposition: A | Discharge status: Home / Self Care | Payer: Self-pay | Source: Ambulatory Visit | Attending: Obstetrics & Gynecology

## 2014-04-13 ENCOUNTER — Ambulatory Visit (HOSPITAL_COMMUNITY): Payer: No Typology Code available for payment source | Admitting: Anesthesiology

## 2014-04-13 ENCOUNTER — Ambulatory Visit (HOSPITAL_COMMUNITY)
Admission: RE | Admit: 2014-04-13 | Discharge: 2014-04-14 | Disposition: A | Discharge status: Home / Self Care | Payer: No Typology Code available for payment source | Source: Ambulatory Visit | Attending: Obstetrics & Gynecology | Admitting: Obstetrics & Gynecology

## 2014-04-13 DIAGNOSIS — K219 Gastro-esophageal reflux disease without esophagitis: Secondary | ICD-10-CM | POA: Diagnosis not present

## 2014-04-13 DIAGNOSIS — E119 Type 2 diabetes mellitus without complications: Secondary | ICD-10-CM | POA: Diagnosis not present

## 2014-04-13 DIAGNOSIS — I10 Essential (primary) hypertension: Secondary | ICD-10-CM | POA: Insufficient documentation

## 2014-04-13 DIAGNOSIS — Z79899 Other long term (current) drug therapy: Secondary | ICD-10-CM | POA: Diagnosis not present

## 2014-04-13 DIAGNOSIS — N8320 Unspecified ovarian cysts: Secondary | ICD-10-CM | POA: Diagnosis present

## 2014-04-13 DIAGNOSIS — N83209 Unspecified ovarian cyst, unspecified side: Secondary | ICD-10-CM | POA: Diagnosis present

## 2014-04-13 DIAGNOSIS — D271 Benign neoplasm of left ovary: Secondary | ICD-10-CM | POA: Diagnosis not present

## 2014-04-13 HISTORY — PX: ROBOTIC ASSISTED LAPAROSCOPIC OVARIAN CYSTECTOMY: SHX6081

## 2014-04-13 LAB — GLUCOSE, CAPILLARY
GLUCOSE-CAPILLARY: 112 mg/dL — AB (ref 70–99)
Glucose-Capillary: 73 mg/dL (ref 70–99)

## 2014-04-13 SURGERY — ROBOTIC ASSISTED LAPAROSCOPIC OVARIAN CYSTECTOMY
Anesthesia: General | Site: Abdomen | Laterality: Left

## 2014-04-13 MED ORDER — NEOSTIGMINE METHYLSULFATE 10 MG/10ML IV SOLN
INTRAVENOUS | Status: AC
  Filled 2014-04-13: qty 1

## 2014-04-13 MED ORDER — PROMETHAZINE HCL 25 MG/ML IJ SOLN
6.2500 mg | INTRAMUSCULAR | Status: DC | PRN
  Administered 2014-04-13: 6.25 mg via INTRAVENOUS

## 2014-04-13 MED ORDER — GLYCOPYRROLATE 0.2 MG/ML IJ SOLN
INTRAMUSCULAR | Status: DC | PRN
  Administered 2014-04-13: 0.4 mg via INTRAVENOUS

## 2014-04-13 MED ORDER — PROMETHAZINE HCL 25 MG PO TABS: 25.0000 mg | ORAL_TABLET | Freq: Four times a day (QID) | ORAL | Status: DC | PRN

## 2014-04-13 MED ORDER — ROCURONIUM BROMIDE 100 MG/10ML IV SOLN
INTRAVENOUS | Status: AC
  Filled 2014-04-13: qty 1

## 2014-04-13 MED ORDER — MIDAZOLAM HCL 2 MG/2ML IJ SOLN
INTRAMUSCULAR | Status: AC
  Filled 2014-04-13: qty 2

## 2014-04-13 MED ORDER — ONDANSETRON HCL 4 MG/2ML IJ SOLN
INTRAMUSCULAR | Status: DC | PRN
Start: 2014-04-13 — End: 2014-04-13
  Administered 2014-04-13: 4 mg via INTRAVENOUS

## 2014-04-13 MED ORDER — KETOROLAC TROMETHAMINE 30 MG/ML IJ SOLN
INTRAMUSCULAR | Status: DC | PRN
  Administered 2014-04-13: 30 mg via INTRAVENOUS

## 2014-04-13 MED ORDER — SCOPOLAMINE 1 MG/3DAYS TD PT72
1.0000 | MEDICATED_PATCH | TRANSDERMAL | Status: DC
  Administered 2014-04-13: 1.5 mg via TRANSDERMAL

## 2014-04-13 MED ORDER — CEFAZOLIN SODIUM-DEXTROSE 2-3 GM-% IV SOLR
INTRAVENOUS | Status: AC
  Filled 2014-04-13: qty 50

## 2014-04-13 MED ORDER — PHENYLEPHRINE 40 MCG/ML (10ML) SYRINGE FOR IV PUSH (FOR BLOOD PRESSURE SUPPORT)
PREFILLED_SYRINGE | INTRAVENOUS | Status: AC
  Filled 2014-04-13: qty 5

## 2014-04-13 MED ORDER — HYDROCODONE-IBUPROFEN 5-200 MG PO TABS: 1.0000 | ORAL_TABLET | Freq: Four times a day (QID) | ORAL | Status: DC | PRN

## 2014-04-13 MED ORDER — LACTATED RINGERS IR SOLN
Status: DC | PRN
  Administered 2014-04-13: 3000 mL

## 2014-04-13 MED ORDER — DEXAMETHASONE SODIUM PHOSPHATE 10 MG/ML IJ SOLN
INTRAMUSCULAR | Status: DC | PRN
  Administered 2014-04-13: 4 mg via INTRAVENOUS

## 2014-04-13 MED ORDER — FENTANYL CITRATE 0.05 MG/ML IJ SOLN
INTRAMUSCULAR | Status: AC
Start: 2014-04-13 — End: 2014-04-13
  Administered 2014-04-13: 50 ug via INTRAVENOUS
  Filled 2014-04-13: qty 2

## 2014-04-13 MED ORDER — PROPOFOL 10 MG/ML IV EMUL
INTRAVENOUS | Status: AC
  Filled 2014-04-13: qty 20

## 2014-04-13 MED ORDER — FENTANYL CITRATE 0.05 MG/ML IJ SOLN
25.0000 ug | INTRAMUSCULAR | Status: DC | PRN
  Administered 2014-04-13: 50 ug via INTRAVENOUS
  Administered 2014-04-13 (×2): 25 ug via INTRAVENOUS
  Administered 2014-04-13: 50 ug via INTRAVENOUS

## 2014-04-13 MED ORDER — CEFAZOLIN SODIUM-DEXTROSE 2-3 GM-% IV SOLR
2.0000 g | INTRAVENOUS | Status: AC
  Administered 2014-04-13: 2 g via INTRAVENOUS

## 2014-04-13 MED ORDER — ROCURONIUM BROMIDE 100 MG/10ML IV SOLN
INTRAVENOUS | Status: DC | PRN
  Administered 2014-04-13: 10 mg via INTRAVENOUS
  Administered 2014-04-13: 40 mg via INTRAVENOUS

## 2014-04-13 MED ORDER — SCOPOLAMINE 1 MG/3DAYS TD PT72
MEDICATED_PATCH | TRANSDERMAL | Status: AC
  Filled 2014-04-13: qty 1

## 2014-04-13 MED ORDER — MIDAZOLAM HCL 2 MG/2ML IJ SOLN: 0.5000 mg | Freq: Once | INTRAMUSCULAR | Status: DC | PRN

## 2014-04-13 MED ORDER — FENTANYL CITRATE 0.05 MG/ML IJ SOLN
INTRAMUSCULAR | Status: AC
  Filled 2014-04-13: qty 5

## 2014-04-13 MED ORDER — MEPERIDINE HCL 25 MG/ML IJ SOLN: 6.2500 mg | INTRAMUSCULAR | Status: DC | PRN

## 2014-04-13 MED ORDER — PROPOFOL 10 MG/ML IV BOLUS
INTRAVENOUS | Status: DC | PRN
  Administered 2014-04-13: 160 mg via INTRAVENOUS

## 2014-04-13 MED ORDER — PROMETHAZINE HCL 25 MG/ML IJ SOLN
INTRAMUSCULAR | Status: AC
  Administered 2014-04-13: 6.25 mg via INTRAVENOUS
  Filled 2014-04-13: qty 1

## 2014-04-13 MED ORDER — ONDANSETRON HCL 4 MG/2ML IJ SOLN
INTRAMUSCULAR | Status: AC
  Filled 2014-04-13: qty 2

## 2014-04-13 MED ORDER — MIDAZOLAM HCL 2 MG/2ML IJ SOLN
INTRAMUSCULAR | Status: DC | PRN
  Administered 2014-04-13: 2 mg via INTRAVENOUS

## 2014-04-13 MED ORDER — LIDOCAINE HCL (CARDIAC) 20 MG/ML IV SOLN
INTRAVENOUS | Status: AC
  Filled 2014-04-13: qty 5

## 2014-04-13 MED ORDER — DEXAMETHASONE SODIUM PHOSPHATE 4 MG/ML IJ SOLN
INTRAMUSCULAR | Status: AC
  Filled 2014-04-13: qty 1

## 2014-04-13 MED ORDER — LIDOCAINE HCL (CARDIAC) 20 MG/ML IV SOLN
INTRAVENOUS | Status: DC | PRN
  Administered 2014-04-13: 50 mg via INTRAVENOUS

## 2014-04-13 MED ORDER — GLYCOPYRROLATE 0.2 MG/ML IJ SOLN
INTRAMUSCULAR | Status: AC
  Filled 2014-04-13: qty 2

## 2014-04-13 MED ORDER — LACTATED RINGERS IV SOLN
INTRAVENOUS | Status: DC
  Administered 2014-04-13: 22:00:00 via INTRAVENOUS

## 2014-04-13 MED ORDER — NEOSTIGMINE METHYLSULFATE 10 MG/10ML IV SOLN
INTRAVENOUS | Status: DC | PRN
  Administered 2014-04-13: 2 mg via INTRAVENOUS

## 2014-04-13 MED ORDER — BUPIVACAINE HCL (PF) 0.25 % IJ SOLN
INTRAMUSCULAR | Status: DC | PRN
  Administered 2014-04-13: 18 mL

## 2014-04-13 MED ORDER — FENTANYL CITRATE 0.05 MG/ML IJ SOLN
INTRAMUSCULAR | Status: DC | PRN
  Administered 2014-04-13: 50 ug via INTRAVENOUS
  Administered 2014-04-13: 25 ug via INTRAVENOUS
  Administered 2014-04-13: 100 ug via INTRAVENOUS

## 2014-04-13 MED ORDER — KETOROLAC TROMETHAMINE 30 MG/ML IJ SOLN: 15.0000 mg | Freq: Once | INTRAMUSCULAR | Status: DC | PRN

## 2014-04-13 MED ORDER — ARTIFICIAL TEARS OP OINT
TOPICAL_OINTMENT | OPHTHALMIC | Status: AC
  Filled 2014-04-13: qty 3.5

## 2014-04-13 MED ORDER — BUPIVACAINE HCL (PF) 0.25 % IJ SOLN
INTRAMUSCULAR | Status: AC
  Filled 2014-04-13: qty 30

## 2014-04-13 MED ORDER — ACETAMINOPHEN 10 MG/ML IV SOLN
1000.0000 mg | Freq: Once | INTRAVENOUS | Status: AC
  Administered 2014-04-13: 1000 mg via INTRAVENOUS
  Filled 2014-04-13: qty 100

## 2014-04-13 MED ORDER — LACTATED RINGERS IV SOLN
INTRAVENOUS | Status: DC
  Administered 2014-04-13 (×3): via INTRAVENOUS

## 2014-04-13 MED ORDER — FENTANYL CITRATE 0.05 MG/ML IJ SOLN
INTRAMUSCULAR | Status: AC
  Filled 2014-04-13: qty 2

## 2014-04-13 MED ORDER — PHENYLEPHRINE HCL 10 MG/ML IJ SOLN
INTRAMUSCULAR | Status: DC | PRN
  Administered 2014-04-13: 40 ug via INTRAVENOUS
  Administered 2014-04-13: 80 ug via INTRAVENOUS
  Administered 2014-04-13: 40 ug via INTRAVENOUS
  Administered 2014-04-13: 120 ug via INTRAVENOUS
  Administered 2014-04-13: 40 ug via INTRAVENOUS
  Administered 2014-04-13: 80 ug via INTRAVENOUS
  Administered 2014-04-13: 50 ug via INTRAVENOUS

## 2014-04-13 MED ORDER — TRAMADOL HCL 50 MG PO TABS
50.0000 mg | ORAL_TABLET | Freq: Four times a day (QID) | ORAL | Status: DC | PRN
  Administered 2014-04-13 – 2014-04-14 (×2): 50 mg via ORAL
  Filled 2014-04-13 (×2): qty 1

## 2014-04-13 SURGICAL SUPPLY — 58 items
BARRIER ADHS 3X4 INTERCEED (GAUZE/BANDAGES/DRESSINGS) ×2 IMPLANT
CHLORAPREP W/TINT 26ML (MISCELLANEOUS) ×2 IMPLANT
CLOTH BEACON ORANGE TIMEOUT ST (SAFETY) ×2 IMPLANT
CONT PATH 16OZ SNAP LID 3702 (MISCELLANEOUS) ×2 IMPLANT
COVER BACK TABLE 60X90IN (DRAPES) ×4 IMPLANT
COVER TIP SHEARS 8 DVNC (MISCELLANEOUS) ×1 IMPLANT
COVER TIP SHEARS 8MM DA VINCI (MISCELLANEOUS) ×1
DECANTER SPIKE VIAL GLASS SM (MISCELLANEOUS) ×2 IMPLANT
DRAPE HUG U DISPOSABLE (DRAPE) ×2 IMPLANT
DRAPE WARM FLUID 44X44 (DRAPE) ×2 IMPLANT
DRSG COVADERM PLUS 2X2 (GAUZE/BANDAGES/DRESSINGS) ×8 IMPLANT
DRSG OPSITE POSTOP 3X4 (GAUZE/BANDAGES/DRESSINGS) ×2 IMPLANT
ELECT REM PT RETURN 9FT ADLT (ELECTROSURGICAL) ×2
ELECTRODE REM PT RTRN 9FT ADLT (ELECTROSURGICAL) ×1 IMPLANT
EVACUATOR SMOKE 8.L (FILTER) ×2 IMPLANT
GAUZE VASELINE 3X9 (GAUZE/BANDAGES/DRESSINGS) IMPLANT
GLOVE BIO SURGEON STRL SZ 6.5 (GLOVE) ×2 IMPLANT
GLOVE BIOGEL PI IND STRL 7.0 (GLOVE) ×1 IMPLANT
GLOVE BIOGEL PI INDICATOR 7.0 (GLOVE) ×1
GOWN STRL REUS W/TWL LRG LVL3 (GOWN DISPOSABLE) ×16 IMPLANT
IV STOPCOCK 4 WAY 40  W/Y SET (IV SOLUTION) ×1
IV STOPCOCK 4 WAY 40 W/Y SET (IV SOLUTION) ×1 IMPLANT
KIT ACCESSORY DA VINCI DISP (KITS) ×1
KIT ACCESSORY DVNC DISP (KITS) ×1 IMPLANT
LIQUID BAND (GAUZE/BANDAGES/DRESSINGS) ×2 IMPLANT
NEEDLE HYPO 22GX1.5 SAFETY (NEEDLE) IMPLANT
OCCLUDER COLPOPNEUMO (BALLOONS) ×2 IMPLANT
PACK ROBOT WH (CUSTOM PROCEDURE TRAY) ×2 IMPLANT
PAD PREP 24X48 CUFFED NSTRL (MISCELLANEOUS) ×4 IMPLANT
PAD TRENDELENBURG POSITION (MISCELLANEOUS) ×2 IMPLANT
PROTECTOR NERVE ULNAR (MISCELLANEOUS) ×4 IMPLANT
SET CYSTO W/LG BORE CLAMP LF (SET/KITS/TRAYS/PACK) IMPLANT
SET IRRIG TUBING LAPAROSCOPIC (IRRIGATION / IRRIGATOR) ×4 IMPLANT
SUT VIC AB 0 CT1 27 (SUTURE)
SUT VIC AB 0 CT1 27XBRD ANTBC (SUTURE) IMPLANT
SUT VIC AB 0 CT2 27 (SUTURE) IMPLANT
SUT VIC AB 2-0 CT1 27 (SUTURE)
SUT VIC AB 2-0 CT1 TAPERPNT 27 (SUTURE) IMPLANT
SUT VIC AB 3-0 SH 27 (SUTURE)
SUT VIC AB 3-0 SH 27X BRD (SUTURE) IMPLANT
SUT VIC AB 4-0 PS2 27 (SUTURE) ×4 IMPLANT
SUT VICRYL 0 UR6 27IN ABS (SUTURE) ×4 IMPLANT
SYR 50ML LL SCALE MARK (SYRINGE) ×2 IMPLANT
SYSTEM CONVERTIBLE TROCAR (TROCAR) ×2 IMPLANT
TIP UTERINE 5.1X6CM LAV DISP (MISCELLANEOUS) IMPLANT
TIP UTERINE 6.7X10CM GRN DISP (MISCELLANEOUS) IMPLANT
TIP UTERINE 6.7X6CM WHT DISP (MISCELLANEOUS) IMPLANT
TIP UTERINE 6.7X8CM BLUE DISP (MISCELLANEOUS) IMPLANT
TOWEL OR 17X24 6PK STRL BLUE (TOWEL DISPOSABLE) ×6 IMPLANT
TRAY FOLEY BAG SILVER LF 14FR (CATHETERS) ×2 IMPLANT
TROCAR 12M 150ML BLUNT (TROCAR) IMPLANT
TROCAR DISP BLADELESS 8 DVNC (TROCAR) ×1 IMPLANT
TROCAR DISP BLADELESS 8MM (TROCAR) ×1
TROCAR OPTI TIP 12M 100M (ENDOMECHANICALS) IMPLANT
TROCAR XCEL 12X100 BLDLESS (ENDOMECHANICALS) ×2 IMPLANT
TROCAR XCEL NON-BLD 5MMX100MML (ENDOMECHANICALS) ×2 IMPLANT
WARMER LAPAROSCOPE (MISCELLANEOUS) ×2 IMPLANT
WATER STERILE IRR 1000ML POUR (IV SOLUTION) ×6 IMPLANT

## 2014-04-13 NOTE — Discharge Summary (Signed)
  Physician Discharge Summary  Patient ID: Lisa Novak MRN: [id number] DOB/AGE: [birth year] 43 y.o.  Admit date: 04/13/2014 Discharge date: 04/13/2014  Admission Diagnoses:Left Pelvic Pain Left Ovarian Cyst Probable Adhesions  Discharge Diagnoses: Left Pelvic Pain, Left Ovarian Cyst, Pelvic Bowel Adhesions        Active Problems:   * No active hospital problems. *   Discharged Condition: good  Hospital Course: Outpatient  Consults: None  Treatments: surgery: Robotic Left ovarian cystectomy, bilateral salpingectomies, lysis of bowel adhesions  Disposition: 01-Home or Self Care     Medication List    STOP taking these medications        HYDROcodone-acetaminophen 5-325 MG per tablet  Commonly known as:  NORCO/VICODIN      TAKE these medications        calcium carbonate 500 MG chewable tablet  Commonly known as:  TUMS - dosed in mg elemental calcium  Chew 2 tablets by mouth daily as needed for indigestion or heartburn.     CAMILA 0.35 MG tablet  Generic drug:  norethindrone  Take 1 tablet by mouth daily.     hydrocodone-ibuprofen 5-200 MG per tablet  Commonly known as:  VICOPROFEN  Take 1 tablet by mouth every 6 (six) hours as needed for pain.     losartan-hydrochlorothiazide 50-12.5 MG per tablet  Commonly known as:  HYZAAR  Take 1 tablet by mouth daily.     phentermine 37.5 MG capsule  Take 37.5 mg by mouth every morning.     phentermine 37.5 MG tablet  Commonly known as:  ADIPEX-P  Take 18.75 mg by mouth daily before breakfast.     ranitidine 150 MG tablet  Commonly known as:  ZANTAC  Take 150 mg by mouth daily.           Follow-up Information    Follow up with Ryu Cerreta,MARIE-LYNE, MD In 3 weeks.   Specialty:  Obstetrics and Gynecology   Contact information:   [place] [address] [phone]       Signed: Princess Bruins, MD 04/13/2014, 3:06 PM

## 2014-04-13 NOTE — H&P (Signed)
Lisa Novak is an 43 y.o. female G64P3A1  RP:  Lt persistent ovarian cyst, Lt pelvic pain for Robotic Lt ovarian cystectomy, possible Rt ovarian cystectomy, lysis of adhesions.  S/P TLH.  Pertinent Gynecological History: S/P TLH Blood transfusions: none Sexually transmitted diseases: no past history Previous GYN Procedures: Robotic TLH  Last mammogram: normal  Last pap: normal  OB History: G4P3A1  Menstrual History:  No LMP recorded. Patient has had a hysterectomy.    Past Medical History  Diagnosis Date  . Hypertension   . GERD (gastroesophageal reflux disease)   . Umbilical hernia   . Diabetes type 2, controlled   . Diverticulitis   . SVD (spontaneous vaginal delivery)     x 3  . PONV (postoperative nausea and vomiting)     Past Surgical History  Procedure Laterality Date  . Abdominal hysterectomy    . Hernia repair    . Abdominoplasty    . Laparoscopic appendectomy N/A 02/11/2014    Procedure: APPENDECTOMY LAPAROSCOPIC;  Surgeon: Autumn Messing III, MD;  Location: [facility];  Service: General;  Laterality: N/A;  . Appendectomy    . Wisdom tooth extraction      History reviewed. No pertinent family history.  Social History:  reports that she has never smoked. She has never used smokeless tobacco. She reports that she does not drink alcohol or use illicit drugs.  Allergies: No Known Allergies  Prescriptions prior to admission  Medication Sig Dispense Refill Last Dose  . calcium carbonate (TUMS - DOSED IN MG ELEMENTAL CALCIUM) 500 MG chewable tablet Chew 2 tablets by mouth daily as needed for indigestion or heartburn.   Past Week at Unknown time  . CAMILA 0.35 MG tablet Take 1 tablet by mouth daily.   3 04/12/2014 at 0630  . HYDROcodone-acetaminophen (NORCO/VICODIN) 5-325 MG per tablet Take 1-2 tablets by mouth every 4 (four) hours as needed for moderate pain or severe pain. 30 tablet 0 Past Month at Unknown time  . losartan-hydrochlorothiazide (HYZAAR) 50-12.5 MG per  tablet Take 1 tablet by mouth daily.   04/13/2014 at 0630  . phentermine (ADIPEX-P) 37.5 MG tablet Take 18.75 mg by mouth daily before breakfast.   Past Week at Unknown time  . ranitidine (ZANTAC) 150 MG tablet Take 150 mg by mouth daily.   04/12/2014 at 0630  . phentermine 37.5 MG capsule Take 37.5 mg by mouth every morning.   7 days ago    ROS  Blood pressure 121/79, pulse 86, temperature 98.4 F (36.9 C), temperature source Oral, resp. rate 16, SpO2 100 %. Physical Exam   Pelvic US:  Lt ovarian cyst simple 4+cm, Rt ovarian cyst complex 2.1 cm.  Results for orders placed or performed during the hospital encounter of 04/13/14 (from the past 24 hour(s))  Glucose, capillary     Status: None   Collection Time: 04/13/14 11:42 AM  Result Value Ref Range   Glucose-Capillary 73 70 - 99 mg/dL    No results found.  Assessment/Plan: Lt persistent ovarian cyst with left pelvic pain.  Smaller Rt ovarian cyst.  For Robotic left ovarian cystectomy, poss. Rt ovarian cystectomy.  Lysis of adhesions.  S/P TLH.  Surgery and risks reviewed.  Issa Kosmicki,MARIE-LYNE 04/13/2014, 12:38 PM

## 2014-04-13 NOTE — Plan of Care (Signed)
Problem: Phase I Progression Outcomes Goal: IS, TCDB as ordered Outcome: Progressing

## 2014-04-13 NOTE — Plan of Care (Signed)
Problem: Phase I Progression Outcomes Goal: Admission history reviewed Outcome: Completed/Met Date Met:  04/13/14

## 2014-04-13 NOTE — Plan of Care (Signed)
Problem: Phase II Progression Outcomes Goal: Progress activity as tolerated unless otherwise ordered Outcome: Progressing     

## 2014-04-13 NOTE — Transfer of Care (Signed)
Immediate Anesthesia Transfer of Care Note  Patient: Lisa Novak  Procedure(s) Performed: Procedure(s): ROBOTIC ASSISTED LAPAROSCOPIC Left OVARIAN CYSTECTOMY, BILATERAL SALPINGECTOMY,lysis of adhesions (Left)  Patient Location: PACU  Anesthesia Type:General  Level of Consciousness: awake  Airway & Oxygen Therapy: Patient Spontanous Breathing  Post-op Assessment: Report given to PACU RN  Post vital signs: stable  Filed Vitals:   04/13/14 1137  BP: 121/79  Pulse: 86  Temp: 36.9 C  Resp: 16    Complications: No apparent anesthesia complications

## 2014-04-13 NOTE — Anesthesia Postprocedure Evaluation (Signed)
Anesthesia Post Note  Patient: Lisa Novak  Procedure(s) Performed: Procedure(s) (LRB): ROBOTIC ASSISTED LAPAROSCOPIC Left OVARIAN CYSTECTOMY, BILATERAL SALPINGECTOMY,lysis of adhesions (Left)  Anesthesia type: General  Patient location: PACU  Post pain: Pain level controlled  Post assessment: Post-op Vital signs reviewed  Last Vitals:  Filed Vitals:   04/13/14 1600  BP: 113/64  Pulse: 95  Temp: 36.5 C  Resp: 16    Post vital signs: Reviewed  Level of consciousness: sedated  Complications: No apparent anesthesia complications

## 2014-04-13 NOTE — Anesthesia Preprocedure Evaluation (Addendum)
Anesthesia Evaluation  Patient identified by MRN, date of birth, ID band Patient awake    Reviewed: Allergy & Precautions, H&P , Patient's Chart, lab work & pertinent test results, reviewed documented beta blocker date and time   History of Anesthesia Complications (+) PONV and history of anesthetic complications  Airway Mallampati: II  TM Distance: >3 FB Neck ROM: full    Dental   Pulmonary  breath sounds clear to auscultation        Cardiovascular Exercise Tolerance: Good hypertension, Rhythm:regular Rate:Normal     Neuro/Psych    GI/Hepatic GERD-  Controlled,  Endo/Other  diabetes  Renal/GU      Musculoskeletal   Abdominal   Peds  Hematology   Anesthesia Other Findings   Reproductive/Obstetrics                            Anesthesia Physical Anesthesia Plan  ASA: III  Anesthesia Plan: General ETT   Post-op Pain Management:    Induction:   Airway Management Planned:   Additional Equipment:   Intra-op Plan:   Post-operative Plan:   Informed Consent: I have reviewed the patients History and Physical, chart, labs and discussed the procedure including the risks, benefits and alternatives for the proposed anesthesia with the patient or authorized representative who has indicated his/her understanding and acceptance.   Dental Advisory Given  Plan Discussed with: CRNA and Surgeon  Anesthesia Plan Comments:         Anesthesia Quick Evaluation

## 2014-04-13 NOTE — Plan of Care (Signed)
Problem: Phase I Progression Outcomes Goal: I & O every 4 hrs or as ordered Outcome: Progressing

## 2014-04-13 NOTE — Plan of Care (Signed)
Problem: Phase II Progression Outcomes Goal: Pain controlled on oral analgesia Outcome: Progressing

## 2014-04-13 NOTE — Plan of Care (Signed)
Problem: Phase I Progression Outcomes Goal: Dangle/OOB as tolerated per MD order Outcome: Progressing

## 2014-04-13 NOTE — Discharge Instructions (Signed)
Ovarian Cystectomy, Care After  Refer to this sheet in the next few weeks. These instructions provide you with information on caring for yourself after your procedure. Your health care provider may also give you more specific instructions. Your treatment has been planned according to current medical practices, but problems sometimes occur. Call your health care provider if you have any problems or questions after your procedure.   WHAT TO EXPECT AFTER THE PROCEDURE  After your procedure, it is typical to have the following:  · Pain in your abdomen, especially at the incision sites. You will be given pain medicines to control the pain.  · Tiredness. This is a normal part of the recovery process. Your energy level will return to normal over the next several weeks.  · Constipation.  HOME CARE INSTRUCTIONS  · Only take over-the-counter or prescription medicines as directed by your health care provider. Avoid taking aspirin because it can cause bleeding.    · Follow your health care provider's instructions for when to resume your regular diet, exercise, and activities.    · Take rest breaks during the day as needed.  · Do not douche or have sexual intercourse until you have permission from your health care provider.    · Remove or change any bandages (dressings) as directed by your health care provider.    · Do not drive until your health care provider approves.    · Take showers instead of baths until your health care provider tells you otherwise.    · If you become constipated, you may:    ¨ Use a mild laxative if your health care provider approves.  ¨ Add more fruit and bran to your diet.  ¨ Drink more fluids.  · Take your temperature twice a day and record it.    · Do not drink alcohol while taking pain medicine.    · Try to have someone home with you for the first 1-2 weeks to help with your household activities.    · Follow up with your health care provider as directed.  SEEK MEDICAL CARE IF:  · You have a fever.     · You feel sick to your stomach (nauseous) and throw up (vomit).    · You have redness, swelling, or leakage of fluid at the incision site.    · You have pain when you urinate or have blood in your urine.    · You have a rash on your body.    · You have pain or redness where the IV tube was inserted.    · You have pain that is not relieved with medicine.    SEEK IMMEDIATE MEDICAL CARE IF:  · You have chest pain or shortness of breath.    · You feel dizzy or lightheaded.    · You have increasing abdominal pain that is not relieved with medicines.    · You have pain, swelling, or redness in your leg.    · You see a yellowish white fluid (pus) coming from the incision.    · Your incision is opening (edges not staying together).    Document Released: 03/04/2013 Document Reviewed: 03/04/2013  [employer]® Patient Information ©2015 [employer]. This information is not intended to replace advice given to you by your health care provider. Make sure you discuss any questions you have with your health care provider.

## 2014-04-13 NOTE — Plan of Care (Signed)
Problem: Phase I Progression Outcomes Goal: VS, stable, temp < 100.4 degrees F Outcome: Progressing

## 2014-04-13 NOTE — Plan of Care (Signed)
Problem: Phase I Progression Outcomes Goal: Pain controlled with appropriate interventions Outcome: Progressing     

## 2014-04-13 NOTE — Op Note (Signed)
04/13/2014  2:46 PM  PATIENT:  Lisa Novak  43 y.o. female  PRE-OPERATIVE DIAGNOSIS:  Left Pelvic Pain, Left Ovarian Cyst, Probable Adhesions  POST-OPERATIVE DIAGNOSIS:  Left Pelvic Pain, Left ovarian cyst, Pelvic Bowel Adhesions  PROCEDURE:  Procedure(s): ROBOTIC ASSISTED LAPAROSCOPIC LEFT OVARIAN CYSTECTOMY, BILATERAL SALPINGECTOMY AND LYSIS OF ADHESIONS  SURGEON:  Surgeon(s): Princess Bruins, MD Lovenia Kim, MD  ASSISTANTS: Lovenia Kim MD  ANESTHESIA:   none   PROCEDURE:  Under general anesthesia with endotracheal intubation the patient is an lithotomy position. She is prepped with ChloraPrep on the abdomen and with Betadine and the suprapubic, vulvar and vaginal areas. She is draped as usual. A Foley catheter is put in place in the bladder. Abdominally we infiltrate the supraumbilical area with Marcaine one quarter plain. A 1.5 cm incision is made with a scalpel at that level. The aponeurosis is opened under direct vision with Mayo scissors. The peritoneum is opened bluntly with the finger. We put a pursestring stitch with Vicryl 0 on the aponeurosis. The Sheryle Hail is inserted at that level and a pneumoperitoneum is created. No adhesion is present with the anterior wall of the abdomen. We therefore used the semicircular port configuration. We used the exact same incision sites as for that robotic Baca that the patient had.  Infiltration of Marcaine one quarter plain at each site. Small incisions are done with the scalpel.  We inserted 2 robotic ports on the right, 1 robotic port on the lower left with the assistant port on the upper left, all under direct vision.  The patient was put in deep Trendelenburg and the robot was docked from the right side. The robotic instruments were inserted under direct vision with the EndoShears scissor on the right arm, the PK on the left arm and the fenestrated clamp on the third arm.  We went to the console.  Pictures were taken before and after  the procedure.  We started with the left ovarian cystectomy.  We used the tip of the EndoShears scissor to open a line on the contralateral mesosalpinx side.  We then grasped the ovarian cyst wall with a clamp and the ovarian tissue with another clamp and remove the cyst by traction and countertraction.  This fluid and the cyst was completely clear.  The cyst wall was thin.  We used the PK for hemostasis in the left ovarian bed.  The specimen was removed to be sent to pathology with the other specimens.  We then proceeded with left salpingectomy. We cauterized with the PK an section with the EndoShears scissor just below the left tube.  We proceeded exactly the same way with the right tube.  We then proceeded with lysis of adhesions.  Removing the sigmoid colon from the vaginal vault area.  Hemostasis was adequate at all levels.  We removed the robotic instruments.  The robot was undocked.  And we went to laparoscopy time.  Both tubes were removed from the abdomen through the supraumbilical port.  They were sent to pathology together with the left ovarian cyst wall.  We suctioned the fluid in the pelvic cavity.  We then used Interceed to prevent re-formation of the adhesions in the pelvis.  All laparoscopic instruments were removed. The ports were removed under direct vision. The CO2 was evacuated.  We closed the supraumbilical incision by attaching the pursestring stitch.  We closed all incisions with a subcuticular stitch of Vicryl 4-0. We added Dermabond on all incisions.  The patient was brought  to recovery room in good and stable status.   ESTIMATED BLOOD LOSS: 25 cc   Intake/Output Summary (Last 24 hours) at 04/13/14 1446 Last data filed at 04/13/14 1440  Gross per 24 hour  Intake   1300 ml  Output     75 ml  Net   1225 ml     BLOOD ADMINISTERED:none   LOCAL MEDICATIONS USED:  MARCAINE     SPECIMEN:  Source of Specimen:  Left ovarian cyst wall and bilateral tubes  DISPOSITION OF SPECIMEN:   PATHOLOGY  COUNTS:  YES  PLAN OF CARE: Transfer to PACU   Princess Bruins MD  04/13/2014 at 2:45 pm

## 2014-04-14 ENCOUNTER — Encounter (HOSPITAL_COMMUNITY): Payer: Self-pay | Admitting: Obstetrics & Gynecology

## 2014-04-14 DIAGNOSIS — D271 Benign neoplasm of left ovary: Secondary | ICD-10-CM | POA: Diagnosis not present

## 2014-04-14 NOTE — Progress Notes (Signed)
Discharge instructions provided to patient and significant other at bedside.  Activity, medications, follow up appointments, incision care,  when to call the doctor and community resources discussed.  No questions at this time.  Patient left unit in stable condition with all personal belongings and prescriptions accompanied by staff.  Leighton Roach, RN------------------------

## 2014-04-14 NOTE — Anesthesia Postprocedure Evaluation (Signed)
  Anesthesia Post-op Note  Patient: Lisa Novak  Procedure(s) Performed: Procedure(s): ROBOTIC ASSISTED LAPAROSCOPIC Left OVARIAN CYSTECTOMY, BILATERAL SALPINGECTOMY,lysis of adhesions (Left)  Patient Location: A-ICU  Anesthesia Type:General  Level of Consciousness: awake, alert , oriented and patient cooperative  Airway and Oxygen Therapy: Patient Spontanous Breathing  Post-op Pain: mild  Post-op Assessment: Patient's Cardiovascular Status Stable, Respiratory Function Stable, No signs of Nausea or vomiting, Adequate PO intake and Pain level controlled  Post-op Vital Signs: stable  Last Vitals:  Filed Vitals:   04/14/14 0948  BP: 111/70  Pulse: 79  Temp: 37 C  Resp: 18    Complications: No apparent anesthesia complications

## 2014-04-14 NOTE — Addendum Note (Signed)
Addendum  created 04/14/14 1009 by Georgeanne Nim, CRNA   Modules edited: Notes Section   Notes Section:  File: [id number]

## 2014-06-17 ENCOUNTER — Other Ambulatory Visit: Payer: Self-pay | Admitting: Internal Medicine

## 2014-06-17 DIAGNOSIS — N63 Unspecified lump in unspecified breast: Secondary | ICD-10-CM

## 2014-07-08 ENCOUNTER — Ambulatory Visit
Admission: RE | Admit: 2014-07-08 | Discharge: 2014-07-08 | Disposition: A | Discharge status: Home / Self Care | Payer: PRIVATE HEALTH INSURANCE | Source: Ambulatory Visit | Attending: Internal Medicine | Admitting: Internal Medicine

## 2014-07-08 DIAGNOSIS — N63 Unspecified lump in unspecified breast: Secondary | ICD-10-CM

## 2014-09-17 ENCOUNTER — Emergency Department (INDEPENDENT_AMBULATORY_CARE_PROVIDER_SITE_OTHER)
Admission: EM | Admit: 2014-09-17 | Discharge: 2014-09-17 | Disposition: A | Discharge status: Home / Self Care | Payer: No Typology Code available for payment source | Source: Home / Self Care | Attending: Family Medicine | Admitting: Family Medicine

## 2014-09-17 ENCOUNTER — Encounter (HOSPITAL_COMMUNITY): Payer: Self-pay | Admitting: Emergency Medicine

## 2014-09-17 DIAGNOSIS — J209 Acute bronchitis, unspecified: Secondary | ICD-10-CM | POA: Diagnosis not present

## 2014-09-17 MED ORDER — LEVOFLOXACIN 500 MG PO TABS: 500.0000 mg | ORAL_TABLET | Freq: Every day | ORAL | Status: DC

## 2014-09-17 NOTE — ED Provider Notes (Signed)
CSN: [id number]     Arrival date & time 09/17/14  1302 History   First MD Initiated Contact with Patient 09/17/14 1328     Chief Complaint  Patient presents with  . URI   (Consider location/radiation/quality/duration/timing/severity/associated sxs/prior Treatment) HPI      44 year old female presents for evaluation of cough, headaches, fatigue, fever, chills, sore throat, sinus pressure. Her symptoms started approximately one month ago. Last week she was started on a Z-Pak which did not help. She denies chest pain or shortness of breath. No measured fever. No NVD. No recent travel or sick contacts  Past Medical History  Diagnosis Date  . Hypertension   . GERD (gastroesophageal reflux disease)   . Umbilical hernia   . Diabetes type 2, controlled   . Diverticulitis   . SVD (spontaneous vaginal delivery)     x 3  . PONV (postoperative nausea and vomiting)    Past Surgical History  Procedure Laterality Date  . Abdominal hysterectomy    . Hernia repair    . Abdominoplasty    . Laparoscopic appendectomy N/A 02/11/2014    Procedure: APPENDECTOMY LAPAROSCOPIC;  Surgeon: Autumn Messing III, MD;  Location: [facility];  Service: General;  Laterality: N/A;  . Appendectomy    . Wisdom tooth extraction    . Robotic assisted laparoscopic ovarian cystectomy Left 04/13/2014    Procedure: ROBOTIC ASSISTED LAPAROSCOPIC Left OVARIAN CYSTECTOMY, BILATERAL SALPINGECTOMY,lysis of adhesions;  Surgeon: Princess Bruins, MD;  Location: [facility];  Service: Gynecology;  Laterality: Left;   No family history on file. History  Substance Use Topics  . Smoking status: Never Smoker   . Smokeless tobacco: Never Used  . Alcohol Use: No   OB History    No data available     Review of Systems  Constitutional: Positive for fever, chills and fatigue.  HENT: Positive for congestion. Negative for ear pain and sore throat.   Respiratory: Positive for cough. Negative for shortness of breath.   Cardiovascular: Negative for  chest pain.  Gastrointestinal: Negative for nausea, vomiting, abdominal pain and diarrhea.  Skin: Negative for rash.  Neurological: Positive for headaches.  All other systems reviewed and are negative.   Allergies  Review of patient's allergies indicates no known allergies.  Home Medications   Prior to Admission medications   Medication Sig Start Date End Date Taking? Authorizing Provider  calcium carbonate (TUMS - DOSED IN MG ELEMENTAL CALCIUM) 500 MG chewable tablet Chew 2 tablets by mouth daily as needed for indigestion or heartburn.    Historical Provider, MD  CAMILA 0.35 MG tablet Take 1 tablet by mouth daily.  03/22/14   Historical Provider, MD  hydrocodone-ibuprofen (VICOPROFEN) 5-200 MG per tablet Take 1 tablet by mouth every 6 (six) hours as needed for pain. 04/13/14   Princess Bruins, MD  levofloxacin (LEVAQUIN) 500 MG tablet Take 1 tablet (500 mg total) by mouth daily. 09/17/14   Liam Graham, PA-C  losartan-hydrochlorothiazide (HYZAAR) 50-12.5 MG per tablet Take 1 tablet by mouth daily.    Historical Provider, MD  phentermine (ADIPEX-P) 37.5 MG tablet Take 18.75 mg by mouth daily before breakfast.    Historical Provider, MD  phentermine 37.5 MG capsule Take 37.5 mg by mouth every morning.    Historical Provider, MD  promethazine (PHENERGAN) 25 MG tablet Take 1 tablet (25 mg total) by mouth every 6 (six) hours as needed for nausea or vomiting. 04/13/14   Princess Bruins, MD  ranitidine (ZANTAC) 150 MG tablet Take 150 mg  by mouth daily.    Historical Provider, MD   BP 140/92 mmHg  Pulse 86  Temp(Src) 97.6 F (36.4 C) (Oral)  Resp 16  SpO2 97% Physical Exam  Constitutional: She is oriented to person, place, and time. Vital signs are normal. She appears well-developed and well-nourished. She appears distressed (uncomfortable appearing).  HENT:  Head: Normocephalic and atraumatic.  Right Ear: External ear normal.  Left Ear: External ear normal.  Nose: Right sinus  exhibits maxillary sinus tenderness. Right sinus exhibits no frontal sinus tenderness. Left sinus exhibits maxillary sinus tenderness. Left sinus exhibits no frontal sinus tenderness.  Mouth/Throat: Oropharynx is clear and moist. No oropharyngeal exudate.  Eyes: Conjunctivae are normal.  Neck: Normal range of motion. Neck supple.  Cardiovascular: Normal rate, regular rhythm and normal heart sounds.   Pulmonary/Chest: Breath sounds normal. No respiratory distress.  Lymphadenopathy:    She has no cervical adenopathy.  Neurological: She is alert and oriented to person, place, and time. She has normal strength. Coordination normal.  Skin: Skin is warm and dry. No rash noted. She is not diaphoretic.  Psychiatric: She has a normal mood and affect. Judgment normal.  Nursing note and vitals reviewed.   ED Course  Procedures (including critical care time) Labs Review Labs Reviewed - No data to display  Imaging Review No results found.   MDM   1. Acute bronchitis, unspecified organism    Treat with levofloxacin, continue symptomatic management, f/u PRN  Meds ordered this encounter  Medications  . levofloxacin (LEVAQUIN) 500 MG tablet    Sig: Take 1 tablet (500 mg total) by mouth daily.    Dispense:  7 tablet    Refill:  0    Order Specific Question:  Supervising Provider    Answer:  Ihor Gully D [place], PA-C 09/17/14 1354

## 2014-09-17 NOTE — ED Notes (Signed)
C/o persistent cold sx onset 4 weeks Finished z-pack PCP called last Wednesday Sx today include prod cough, HA, weakness, fevers Alert, no signs of acute distress.

## 2014-09-17 NOTE — Discharge Instructions (Signed)

## 2015-06-09 ENCOUNTER — Other Ambulatory Visit: Payer: Self-pay | Admitting: Internal Medicine

## 2015-06-09 DIAGNOSIS — N6310 Unspecified lump in the right breast, unspecified quadrant: Secondary | ICD-10-CM

## 2015-07-11 ENCOUNTER — Other Ambulatory Visit: Payer: No Typology Code available for payment source

## 2015-07-14 ENCOUNTER — Other Ambulatory Visit: Payer: Self-pay | Admitting: Internal Medicine

## 2015-07-14 DIAGNOSIS — M5441 Lumbago with sciatica, right side: Secondary | ICD-10-CM

## 2015-07-15 ENCOUNTER — Ambulatory Visit
Admission: RE | Admit: 2015-07-15 | Discharge: 2015-07-15 | Disposition: A | Discharge status: Home / Self Care | Payer: No Typology Code available for payment source | Source: Ambulatory Visit | Attending: Internal Medicine | Admitting: Internal Medicine

## 2015-07-15 DIAGNOSIS — N6310 Unspecified lump in the right breast, unspecified quadrant: Secondary | ICD-10-CM

## 2015-07-25 ENCOUNTER — Other Ambulatory Visit: Payer: No Typology Code available for payment source

## 2015-07-31 ENCOUNTER — Ambulatory Visit
Admission: RE | Admit: 2015-07-31 | Discharge: 2015-07-31 | Disposition: A | Discharge status: Home / Self Care | Payer: No Typology Code available for payment source | Source: Ambulatory Visit | Attending: Internal Medicine | Admitting: Internal Medicine

## 2015-07-31 DIAGNOSIS — M5441 Lumbago with sciatica, right side: Secondary | ICD-10-CM

## 2015-07-31 IMAGING — MR MR LUMBAR SPINE W/O CM
4 of 5 series · 18 of 48 positions shown · non-contrast
Comparison: None.

CLINICAL DATA: Acute bilateral low back pain with right-sided
sciatica. Symptoms for 8 months.

EXAM:
MRI LUMBAR SPINE WITHOUT CONTRAST
TECHNIQUE: Multiplanar, multisequence MR imaging of the lumbar spine was
performed. No intravenous contrast was administered.

[Series 5: T2 · sagittal · 4.0mm · 0.73mm/px · 5 of 15 slices shown (1 of 2)]
[im 1/15]
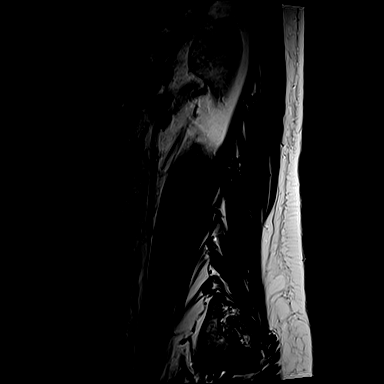
[im 4/15]
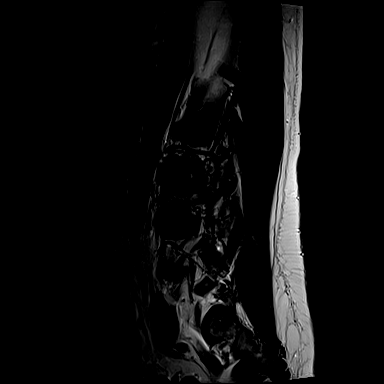
[im 8/15]
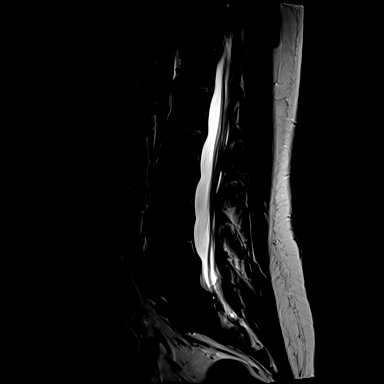
[im 11/15]
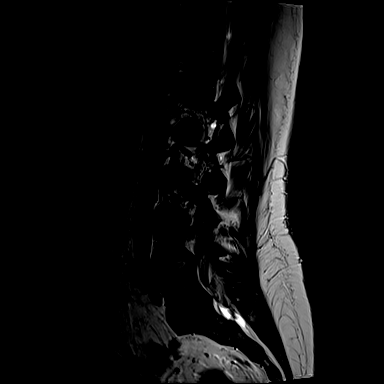
[im 15/15]
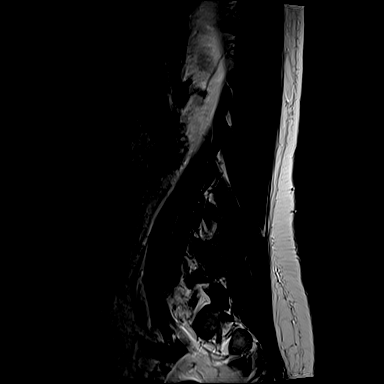

[Series 7: T1 · sagittal · 4.0mm · 0.73mm/px · 3 of 15 slices shown (1 of 2)]
[im 1/15]
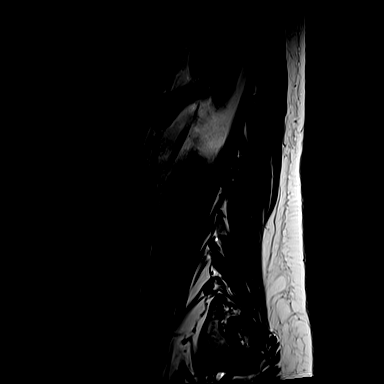
[im 8/15]
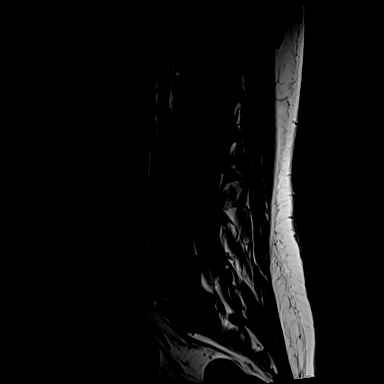
[im 15/15]
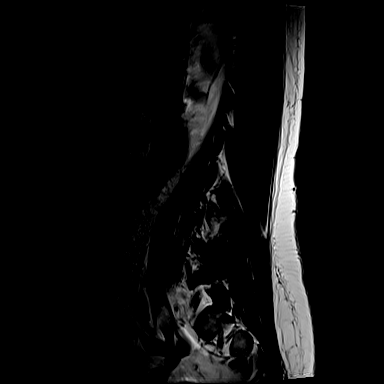

[Series 10: T1 · axial · 4.0mm · 0.28mm/px · z∈[-121,+3]mm · 3 of 66 slices shown (2 of 2)]
[im 10/66]
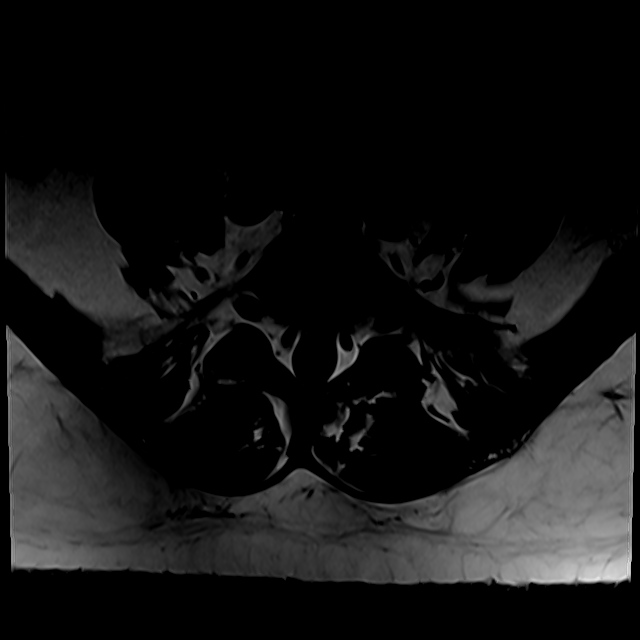
[im 35/66]
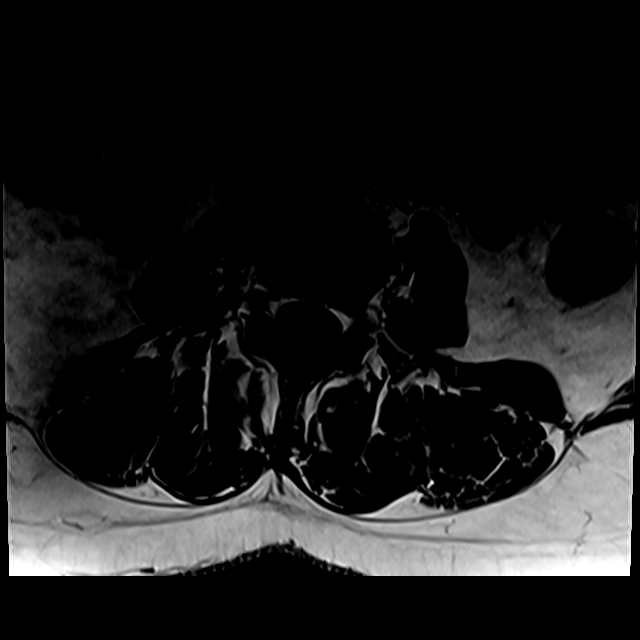
[im 56/66]
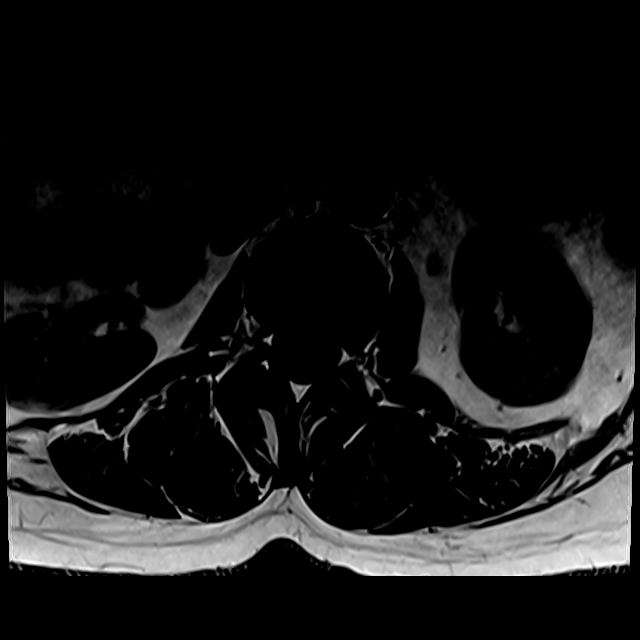

[Series 16: T2 · axial · 4.0mm · 0.28mm/px · z∈[-141,+8]mm · 7 of 33 slices shown (2 of 2)]
[im 1/33]
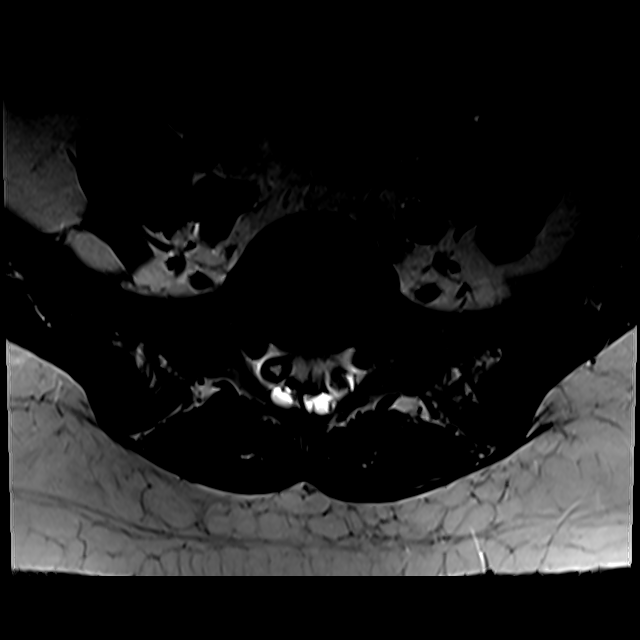
[im 4/33]
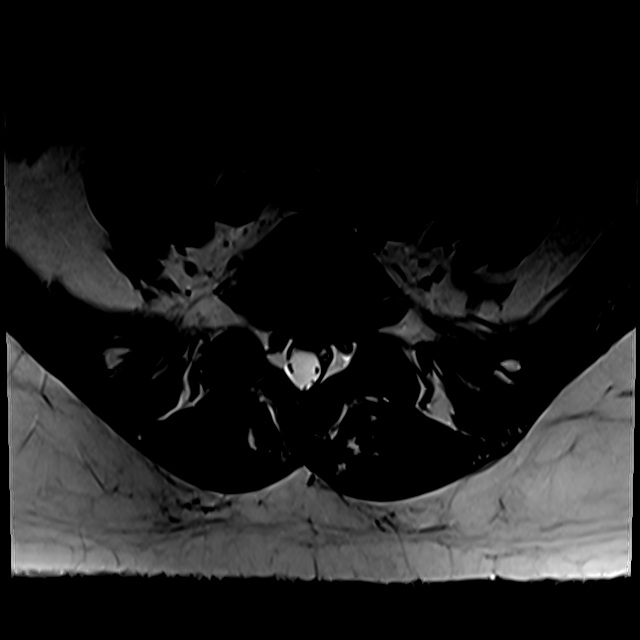
[im 7/33]
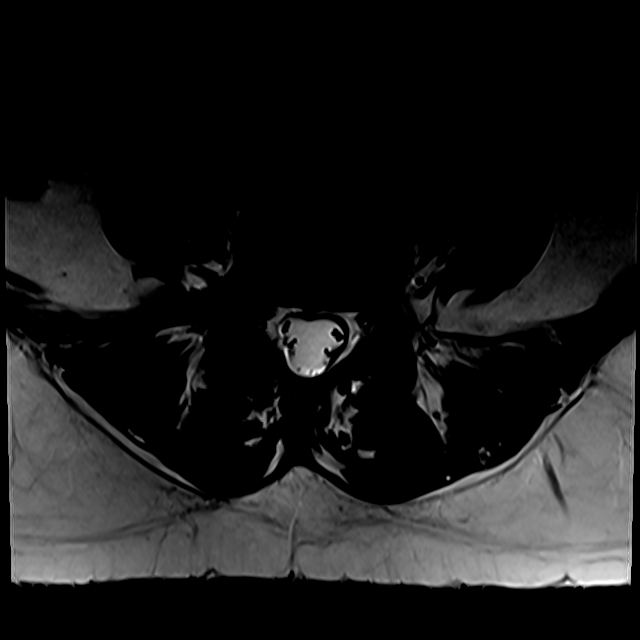
[im 10/33]
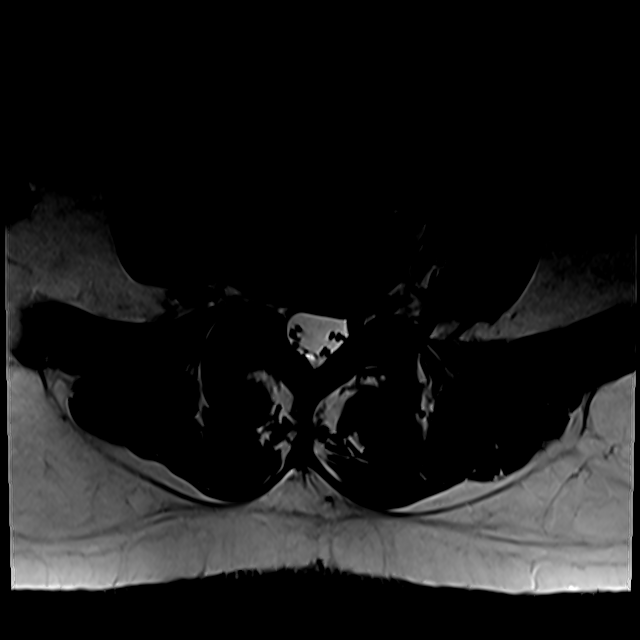
[im 13/33]
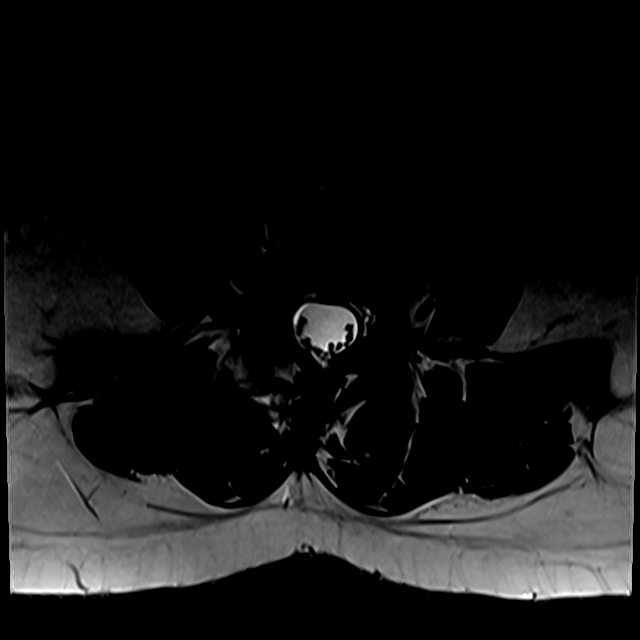
[im 17/33]
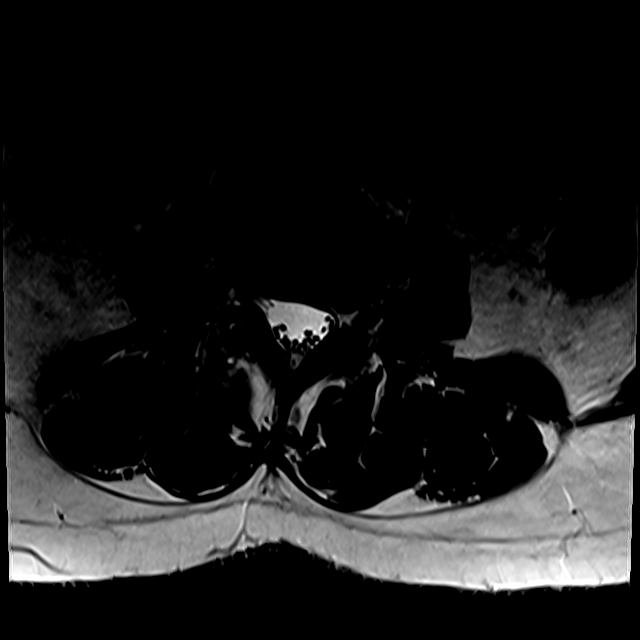
[im 29/33]
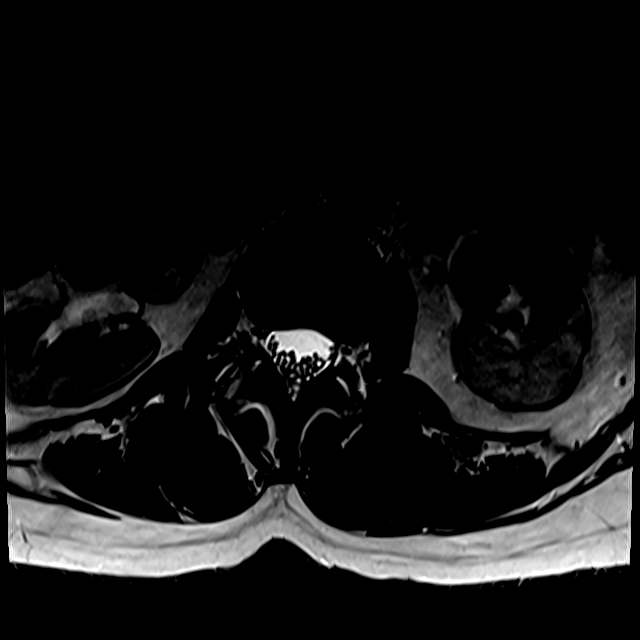

[18 of 48 positions shown; findings below may reference images not displayed]

FINDINGS: Segmentation: Standard.

Alignment: Mild levocurvature, also seen on prior CT from 5703. No
subluxation

Vertebrae: No signal abnormality to suggest fracture, discitis, or
mass.

Conus: Extends to the T12-L1 disc level and appears normal.

Paraspinal and retroperitoneal structures: Soft tissue edema
surrounds the right L4-5 facet. This facet is chronically
arthropathic based on 5703 abdominal CT. No joint effusion.

Abnormal levels:

L2-L3:

Disc: Desiccation, mild narrowing, and leftward far-lateral bulging.

Facets: Mild overgrowth on the right

Canal: Patent.

Foramina: No impingement.

L3-L4:

Disc: Mild desiccation and narrowing.  Mild leftward bulging.

Facets: Degenerative arthropathy with marginal spurring

Canal: Patent.

Foramina: No impingement.

L4-L5:

Disc: Mild bulging

Facets: Arthropathy asymmetric to the right where there is
periarticular inflammatory features. Inflammation is likely
noninfectious given symmetric preserved subchondral bone plate, no
joint effusion, and history.

Canal: Patent.

Foramina: No impingement.

L5-S1:

Disc: Left paracentral disc protrusion and annular fissure, best
seen on sagittal acquisition, noncompressive.

Facets: Arthropathy with spurring

Canal: Patent.

Foramina: No impingement.
IMPRESSION: 1. Facet arthropathy at L3-4 and below, progressed compared to 5703
abdominal CT. Notable periarticular inflammation on the right at
L4-5.
2. Mild lumbar levoconvexity and disc degeneration as described.
3. No compressive stenosis.

## 2015-08-17 ENCOUNTER — Ambulatory Visit: Payer: No Typology Code available for payment source | Attending: Neurosurgery

## 2015-08-17 DIAGNOSIS — M5441 Lumbago with sciatica, right side: Secondary | ICD-10-CM | POA: Insufficient documentation

## 2015-08-17 DIAGNOSIS — M62830 Muscle spasm of back: Secondary | ICD-10-CM | POA: Insufficient documentation

## 2015-08-17 NOTE — Patient Instructions (Signed)
Perform all exercises below:  Hold _20___ seconds. Repeat _3___ times.  Do __3__ sessions per day. CAUTION: Movement should be gentle, steady and slow.  Knee to Chest  Lying supine, bend involved knee to chest. Perform with each leg.  Piriformis Stretch - Supine    Pull uninvolved knee across body toward opposite shoulder.  Lumbar Rotation: Caudal - Bilateral (Supine)  Feet and knees together, arms outstretched, rotate knees left, turning head in opposite direction, until stretch is felt.      HIP: Hamstrings - Short Sitting   Keep knee straight. Lift chest. Lean forward from hips until a stretch is felt  [facility] [address], [place], [place] [address] Phone # [phone] Fax [phone]

## 2015-08-17 NOTE — Therapy (Signed)
[facility] [address], [place], [place], [address] Phone: [phone]   Fax:  [phone]  Physical Therapy Evaluation  Patient Details  Name: Lisa Novak MRN: [id number] Date of Birth: 26-Nov-1970 Referring Provider: Jovita Gamma, MD  Encounter Date: 08/17/2015      PT End of Session - 08/17/15 1222    Visit Number 1   Date for PT Re-Evaluation 10/12/15   PT Start Time [id number]   PT Stop Time 1229   PT Time Calculation (min) 41 min   Activity Tolerance Patient tolerated treatment well   Behavior During Therapy [facility] for tasks assessed/performed      Past Medical History  Diagnosis Date  . Hypertension   . GERD (gastroesophageal reflux disease)   . Umbilical hernia   . Diabetes type 2, controlled ([place])   . Diverticulitis   . SVD (spontaneous vaginal delivery)     x 3  . PONV (postoperative nausea and vomiting)     Past Surgical History  Procedure Laterality Date  . Abdominal hysterectomy    . Hernia repair    . Abdominoplasty    . Laparoscopic appendectomy N/A 02/11/2014    Procedure: APPENDECTOMY LAPAROSCOPIC;  Surgeon: Autumn Messing III, MD;  Location: [place];  Service: General;  Laterality: N/A;  . Appendectomy    . Wisdom tooth extraction    . Robotic assisted laparoscopic ovarian cystectomy Left 04/13/2014    Procedure: ROBOTIC ASSISTED LAPAROSCOPIC Left OVARIAN CYSTECTOMY, BILATERAL SALPINGECTOMY,lysis of adhesions;  Surgeon: Princess Bruins, MD;  Location: [facility];  Service: Gynecology;  Laterality: Left;    There were no vitals filed for this visit.  Visit Diagnosis:  Right-sided low back pain with right-sided sciatica - Plan: PT plan of care cert/re-cert  Muscle spasm of back - Plan: PT plan of care cert/re-cert      Subjective Assessment - 08/17/15 1152    Subjective Pt presents to PT with LBP and Rt hip pain with Rt LE radiculopathy that began when she fell on steps 12/15/14.  She missed  a step, caught herself and landed in awkward position.  Pain has intensified over the past 3 months.     Pertinent History Pt had a fall 12/15/14   Limitations Standing;Sitting;Walking   How long can you sit comfortably? 10 minutes   How long can you stand comfortably? 10 minutes max   How long can you walk comfortably? 10 minutes   Diagnostic tests MRI and x-ray: OA of lumbar spine, negative for HNP   Patient Stated Goals return to exercise, stand longer in the community, reduce pain, sit longer   Currently in Pain? Yes   Pain Score 6    Pain Location Back   Pain Orientation Right;Lower   Pain Descriptors / Indicators Aching;Shooting;Tightness;Pressure   Pain Type Chronic pain   Pain Radiating Towards Rt buttock and lateral thigh   Pain Onset More than a month ago   Pain Frequency Constant   Aggravating Factors  sitting, standing, walking > 10 minutes, bending over/housework   Pain Relieving Factors laying down, rest, heat            [facility] PT Assessment - 08/17/15 0001    Assessment   Medical Diagnosis OA of spine with radiculopathy, lumbar [id number])   Referring Provider Jovita Gamma, MD   Onset Date/Surgical Date 12/15/14   Next MD Visit 6 weeks   Prior Therapy none   Precautions   Precautions None   Balance Screen  Has the patient fallen in the past 6 months No   Has the patient had a decrease in activity level because of a fear of falling?  No   Is the patient reluctant to leave their home because of a fear of falling?  No   Home Environment   Living Environment Private residence   Living Arrangements Alone   Type of [place] to enter   Entrance Stairs-Number of Steps 3   Prior Function   Level of Independence Independent   Vocation Full time employment   Vocation Requirements desk work, some lifting   Leisure none.  Not able to exercise   Cognition   Overall Cognitive Status Within Functional Limits for tasks assessed    Observation/Other Assessments   Observations --   Focus on Therapeutic Outcomes (FOTO)  60% limitation   ROM / Strength   AROM / PROM / Strength AROM;PROM;Strength   AROM   Overall AROM  Within functional limits for tasks performed   Overall AROM Comments Full lumbar AROM. Rt sided lumbar pain with Rt sidebending and flexion.  Lumbar segmental mobility is limited with flexion.   PROM   Overall PROM  Within functional limits for tasks performed   Overall PROM Comments Hip flexibility is full with Rt pain reported at lumbar spine and iliac crest with PROM of Rt hip   Strength   Overall Strength Within functional limits for tasks performed   Overall Strength Comments 5/5 bil. LE strength   Palpation   Spinal mobility reduced mobility L3-5 with PA mobs with pain reported.    Palpation comment active trigger points over Rt lumbar paraspinals, QL and deep gluteals. No tenderness on the Lt.     Special Tests    Special Tests Lumbar   Lumbar Tests Slump Test;Straight Leg Raise   Slump test   Findings Negative   Side Right   Straight Leg Raise   Findings Negative   Side  Right   Ambulation/Gait   Ambulation/Gait Yes   Ambulation/Gait Assistance 7: Independent   Gait Pattern Within Functional Limits                           PT Education - 08/17/15 1222    Education provided Yes   Education Details HEP: hip and lumbar flexiblity   Person(s) Educated Patient   Methods Explanation;Demonstration;Handout   Comprehension Verbalized understanding;Returned demonstration          PT Short Term Goals - 08/17/15 1234    PT SHORT TERM GOAL #1   Title be independent in initial HEP   Time 4   Period Weeks   Status New   PT SHORT TERM GOAL #2   Title report a 30% reduction in LBP with home and work tasks   Time 4   Period Weeks   Status New   PT SHORT TERM GOAL #3   Title tolerate standing for 20 minutes at home and in the community    Time 4   Period Weeks    Status New   PT SHORT TERM GOAL #4   Title sit for 30 minutes at work without increased pain   Time 4   Period Weeks   Status New           PT Long Term Goals - 08/17/15 1147    PT LONG TERM GOAL #1   Title be independent in advanced HEP  Time 8   Period Weeks   Status New   PT LONG TERM GOAL #2   Title reduce FOTO to < or = to 43% limitation   Time 8   Period Weeks   Status New   PT LONG TERM GOAL #3   Title report a 60% reduction in LBP with home and work tasks   Time 8   Period Weeks   Status New   PT LONG TERM GOAL #4   Title tolerate standing/walking for 1 hour in the community without limitation   Time 8   Period Weeks   Status New   PT LONG TERM GOAL #5   Title sit at work for 1 hour with 1-2 mini breaks without increased LBP   Time 8   Period Weeks   Status New               Plan - 08/17/15 1229    Clinical Impression Statement Pt presents to PT with Rt sided LBP and Rt gluteal pain that began after a fall 12/15/14.  Pt caught her fall on a set of steps and reports immediate pain at that time.  Imaging was negative except for OA of the lumbar spine.  Pt is limited to sitting/standing/walking 10 minutes due to pain.  Pt with active trigger points in Rt gluteals, QL, and lumbar paraspinals and with limited segmental mobility in the lower lumbar spine.  Pt will benefit from skilled PT for dry needling, manual therapy, flexiblity, core strength and body mechanics education to allow for longer tolerance for standing/sitting and reduce pain.     Pt will benefit from skilled therapeutic intervention in order to improve on the following deficits Pain;Increased muscle spasms;Decreased activity tolerance   Rehab Potential Good   PT Frequency 2x / week   PT Duration 8 weeks   PT Treatment/Interventions ADLs/Self Care Home Management;Cryotherapy;Electrical Stimulation;Moist Heat;Therapeutic exercise;Therapeutic activities;Manual techniques;Dry needling;Patient/family  education;Ultrasound;Functional mobility training;Neuromuscular re-education;Passive range of motion   PT Next Visit Plan Dry needling, flexibility, body mechanics education, core strength   Consulted and Agree with Plan of Care Patient         Problem List Patient Active Problem List   Diagnosis Date Noted  . Ovarian cyst 04/13/2014  . Appendicitis, acute 02/11/2014    Sigurd Sos, PT 08/17/2015 12:41 PM  [facility] [address] [place], [place], [address] Phone: [phone]   Fax:  [phone]  Name: Lisa Novak MRN: [id number] Date of Birth: 1971-01-21

## 2015-08-19 ENCOUNTER — Ambulatory Visit: Payer: No Typology Code available for payment source | Admitting: Physical Therapy

## 2015-08-19 DIAGNOSIS — M62830 Muscle spasm of back: Secondary | ICD-10-CM

## 2015-08-19 DIAGNOSIS — M5441 Lumbago with sciatica, right side: Secondary | ICD-10-CM

## 2015-08-19 NOTE — Therapy (Signed)
[facility] [address] [place], [place], [address] Phone: [phone]   Fax:  [phone]  Physical Therapy Treatment  Patient Details  Name: Lisa Novak MRN: [id number] Date of Birth: May 31, 1970 Referring Provider: Jovita Gamma, MD  Encounter Date: 08/19/2015      PT End of Session - 08/19/15 0924    Visit Number 2   Date for PT Re-Evaluation 10/12/15   PT Start Time 0845   PT Stop Time 0935   PT Time Calculation (min) 50 min   Activity Tolerance Patient tolerated treatment well      Past Medical History  Diagnosis Date  . Hypertension   . GERD (gastroesophageal reflux disease)   . Umbilical hernia   . Diabetes type 2, controlled ([place])   . Diverticulitis   . SVD (spontaneous vaginal delivery)     x 3  . PONV (postoperative nausea and vomiting)     Past Surgical History  Procedure Laterality Date  . Abdominal hysterectomy    . Hernia repair    . Abdominoplasty    . Laparoscopic appendectomy N/A 02/11/2014    Procedure: APPENDECTOMY LAPAROSCOPIC;  Surgeon: Autumn Messing III, MD;  Location: [place];  Service: General;  Laterality: N/A;  . Appendectomy    . Wisdom tooth extraction    . Robotic assisted laparoscopic ovarian cystectomy Left 04/13/2014    Procedure: ROBOTIC ASSISTED LAPAROSCOPIC Left OVARIAN CYSTECTOMY, BILATERAL SALPINGECTOMY,lysis of adhesions;  Surgeon: Princess Bruins, MD;  Location: [facility];  Service: Gynecology;  Laterality: Left;    There were no vitals filed for this visit.  Visit Diagnosis:  Right-sided low back pain with right-sided sciatica  Muscle spasm of back      Subjective Assessment - 08/19/15 0846    Subjective (p) Interested in dry needling.    Currently in Pain? (p) Yes   Pain Orientation (p) Right   Pain Type (p) Chronic pain   Pain Onset (p) More than a month ago   Pain Frequency (p) Constant                         [facility] Adult PT  Treatment/Exercise - 08/19/15 0001    Knee/Hip Exercises: Stretches   Piriformis Stretch 2 reps;Right;20 seconds   Knee/Hip Exercises: Sidelying   Clams 10   Modalities   Modalities Moist Heat   Manual Therapy   Manual Therapy Joint mobilization;Soft tissue mobilization;Muscle Energy Technique   Joint Mobilization right long axis hip distraction, inferior, AP in IR grade 3 3x 20 sec; sidelying neutral gapping and distraction grade 3 3x 20 sec   Soft tissue mobilization right paraspinals, right gluteals and piriformis   Muscle Energy Technique piriformis contract-relax 3x 5 sec          Trigger Point Dry Needling - 08/19/15 0927    Consent Given? Yes   Education Handout Provided Yes   Muscles Treated Lower Body Gluteus minimus;Gluteus maximus;Piriformis  B lumbar multifidi   Gluteus Maximus Response Twitch response elicited;Palpable increased muscle length   Gluteus Minimus Response Palpable increased muscle length   Piriformis Response Palpable increased muscle length              PT Education - 08/19/15 0924    Education provided Yes   Education Details clams; dry needling after care   Person(s) Educated Patient   Methods Explanation;Demonstration;Handout   Comprehension Verbalized understanding;Returned demonstration          PT  Short Term Goals - 08/19/15 1006    PT SHORT TERM GOAL #1   Title be independent in initial HEP   Time 4   Period Weeks   Status On-going   PT SHORT TERM GOAL #2   Title report a 30% reduction in LBP with home and work tasks   Time 4   Period Weeks   Status On-going   PT SHORT TERM GOAL #3   Title tolerate standing for 20 minutes at home and in the community    Time 4   Period Weeks   Status On-going   PT SHORT TERM GOAL #4   Title sit for 30 minutes at work without increased pain   Time 4   Period Weeks   Status On-going           PT Long Term Goals - 08/19/15 1013    PT LONG TERM GOAL #1   Title be independent in  advanced HEP   Time 8   Period Weeks   Status On-going   PT LONG TERM GOAL #2   Title reduce FOTO to < or = to 43% limitation   Time 8   Period Weeks   Status On-going   PT LONG TERM GOAL #3   Title report a 60% reduction in LBP with home and work tasks   Time 8   Period Weeks   Status On-going   PT LONG TERM GOAL #4   Title tolerate standing/walking for 1 hour in the community without limitation   Period Weeks   Status On-going   PT LONG TERM GOAL #5   Title sit at work for 1 hour with 1-2 mini breaks without increased LBP   Time 8   Period Weeks   Status On-going               Plan - 08/19/15 0925    Clinical Impression Statement The patient has a long history of right back and posterior/lateral hip.  Multiple tender points in right lumbar paraspinals, gluteals and piriformis.  Improved muscle length following dry needling to these muscles and improved mobility with manual therapy.  Able to initiate low level glut medius strengthening.  Therapist closely monitoring response to all interventions    PT Next Visit Plan assess response to DN #1; progress glut strengthening;  core strength        Problem List Patient Active Problem List   Diagnosis Date Noted  . Ovarian cyst 04/13/2014  . Appendicitis, acute 02/11/2014    Alvera Singh 08/19/2015, 10:19 AM  [facility] [address] [place], [place], [address] Phone: [phone]   Fax:  [phone]  Name: Lisa Novak MRN: [id number] Date of Birth: 16-Jul-1970    Ruben Im, PT 08/19/2015 10:19 AM Phone: [phone] Fax: [phone]

## 2015-08-22 ENCOUNTER — Ambulatory Visit: Payer: No Typology Code available for payment source | Admitting: Physical Therapy

## 2015-08-22 DIAGNOSIS — M5441 Lumbago with sciatica, right side: Secondary | ICD-10-CM | POA: Diagnosis not present

## 2015-08-22 DIAGNOSIS — M62830 Muscle spasm of back: Secondary | ICD-10-CM

## 2015-08-22 NOTE — Patient Instructions (Signed)

## 2015-08-22 NOTE — Therapy (Signed)
[place], [place], [address] Phone: [phone]   Fax:  [phone]  Physical Therapy Treatment  Patient Details  Name: Lisa Novak MRN: [id number] Date of Birth: [birth year] Referring Provider: Jovita Gamma, MD  Encounter Date: 08/22/2015      PT End of Session - 08/22/15 1545    Visit Number 3   Number of Visits 16   Date for PT Re-Evaluation 10/12/15   PT Start Time 1330   PT Stop Time 1430   PT Time Calculation (min) 60 min   Activity Tolerance Patient tolerated treatment well   Behavior During Therapy [facility] for tasks assessed/performed      Past Medical History  Diagnosis Date  . Hypertension   . GERD (gastroesophageal reflux disease)   . Umbilical hernia   . Diabetes type 2, controlled ([place])   . Diverticulitis   . SVD (spontaneous vaginal delivery)     x 3  . PONV (postoperative nausea and vomiting)     Past Surgical History  Procedure Laterality Date  . Abdominal hysterectomy    . Hernia repair    . Abdominoplasty    . Laparoscopic appendectomy N/A 02/11/2014    Procedure: APPENDECTOMY LAPAROSCOPIC;  Surgeon: Autumn Messing III, MD;  Location: [place];  Service: General;  Laterality: N/A;  . Appendectomy    . Wisdom tooth extraction    . Robotic assisted laparoscopic ovarian cystectomy Left 04/13/2014    Procedure: ROBOTIC ASSISTED LAPAROSCOPIC Left OVARIAN CYSTECTOMY, BILATERAL SALPINGECTOMY,lysis of adhesions;  Surgeon: Princess Bruins, MD;  Location: [facility];  Service: Gynecology;  Laterality: Left;    There were no vitals filed for this visit.  Visit Diagnosis:  Right-sided low back pain with right-sided sciatica  Muscle spasm of back      Subjective Assessment - 08/22/15 1337    Subjective Pain is 6/10 today, just in lower back/hip.  Was in tears this weekend while standing about an hour. Not sure if dry needling helped or not. aggravating factors are the same.    Currently in Pain? Yes   Pain Score 6    Pain Location Back   Pain Orientation Right;Lower   Pain Descriptors / Indicators Aching   Pain Type Chronic pain   Pain Onset More than a month ago   Pain Frequency Constant                         [facility] Adult PT Treatment/Exercise - 08/22/15 1341    Lumbar Exercises: Stretches   Single Knee to Chest Stretch 2 reps;30 seconds   Piriformis Stretch 2 reps;Right;20 seconds   Lumbar Exercises: Supine   Ab Set 5 reps   Clam 10 reps   Clam Limitations bilateral and unilateral    Heel Slides 10 reps   Bent Knee Raise 10 reps   Bridge 10 reps   Knee/Hip Exercises: Sidelying   Clams x 20 bilat. cues for technique   Moist Heat Therapy   Number Minutes Moist Heat 10 Minutes   Moist Heat Location Lumbar Spine;Hip  in sidelying    Manual Therapy   Joint Mobilization Rt. hip ER and IR with compression to glutes, piriformis   Soft tissue mobilization Rt. lumbar paraspinals and hip , tol deep pressure                 PT Education - 08/22/15 1545    Education provided Yes   Education Details stabilization of  LS spine, neutral    Person(s) Educated Patient   Methods Explanation;Demonstration;Tactile cues;Verbal cues;Handout   Comprehension Verbalized understanding;Returned demonstration;Need further instruction          PT Short Term Goals - 08/19/15 1006    PT SHORT TERM GOAL #1   Title be independent in initial HEP   Time 4   Period Weeks   Status On-going   PT SHORT TERM GOAL #2   Title report a 30% reduction in LBP with home and work tasks   Time 4   Period Weeks   Status On-going   PT SHORT TERM GOAL #3   Title tolerate standing for 20 minutes at home and in the community    Time 4   Period Weeks   Status On-going   PT SHORT TERM GOAL #4   Title sit for 30 minutes at work without increased pain   Time 4   Period Weeks   Status On-going           PT Long Term Goals - 08/19/15 1013    PT LONG  TERM GOAL #1   Title be independent in advanced HEP   Time 8   Period Weeks   Status On-going   PT LONG TERM GOAL #2   Title reduce FOTO to < or = to 43% limitation   Time 8   Period Weeks   Status On-going   PT LONG TERM GOAL #3   Title report a 60% reduction in LBP with home and work tasks   Time 8   Period Weeks   Status On-going   PT LONG TERM GOAL #4   Title tolerate standing/walking for 1 hour in the community without limitation   Period Weeks   Status On-going   PT LONG TERM GOAL #5   Title sit at work for 1 hour with 1-2 mini breaks without increased LBP   Time 8   Period Weeks   Status On-going               Plan - 08/22/15 1546    Clinical Impression Statement Doing exercises, shifted focus to stabilization for long term results.  Hip ROM is functional but limited in Rt. piriformis length, trigger points in glute med.  May opt for dry needling again but not sure if it helped.    PT Next Visit Plan progress stab, manual and reduce inflammation rt. hip./lumbar   PT Home Exercise Plan prepilates, clam   Consulted and Agree with Plan of Care Patient        Problem List Patient Active Problem List   Diagnosis Date Noted  . Ovarian cyst 04/13/2014  . Appendicitis, acute 02/11/2014    PAA,JENNIFER 08/22/2015, 3:51 PM  [facility] [address] [place], [place], [address] Phone: [phone]   Fax:  [phone]  Name: Lisa Novak MRN: [id number] Date of Birth: Nov 13, 1970    Raeford Razor, PT 08/22/2015 3:51 PM Phone: [phone] Fax: [phone]

## 2015-08-24 ENCOUNTER — Ambulatory Visit: Payer: No Typology Code available for payment source | Admitting: Physical Therapy

## 2015-08-24 DIAGNOSIS — M5441 Lumbago with sciatica, right side: Secondary | ICD-10-CM

## 2015-08-24 DIAGNOSIS — M62830 Muscle spasm of back: Secondary | ICD-10-CM

## 2015-08-25 NOTE — Therapy (Signed)
[place], [place], [address] Phone: [phone]   Fax:  [phone]  Physical Therapy Treatment  Patient Details  Name: Lisa Novak MRN: [id number] Date of Birth: 1971-02-12 Referring Provider: Jovita Gamma, MD  Encounter Date: 08/24/2015      PT End of Session - 08/24/15 1351    Visit Number 4   Number of Visits 16   Date for PT Re-Evaluation 10/12/15   PT Start Time 6962   PT Stop Time 1432   PT Time Calculation (min) 45 min   Activity Tolerance Patient tolerated treatment well   Behavior During Therapy [facility] for tasks assessed/performed      Past Medical History  Diagnosis Date  . Hypertension   . GERD (gastroesophageal reflux disease)   . Umbilical hernia   . Diabetes type 2, controlled ([facility])   . Diverticulitis   . SVD (spontaneous vaginal delivery)     x 3  . PONV (postoperative nausea and vomiting)     Past Surgical History  Procedure Laterality Date  . Abdominal hysterectomy    . Hernia repair    . Abdominoplasty    . Laparoscopic appendectomy N/A 02/11/2014    Procedure: APPENDECTOMY LAPAROSCOPIC;  Surgeon: Autumn Messing III, MD;  Location: [place];  Service: General;  Laterality: N/A;  . Appendectomy    . Wisdom tooth extraction    . Robotic assisted laparoscopic ovarian cystectomy Left 04/13/2014    Procedure: ROBOTIC ASSISTED LAPAROSCOPIC Left OVARIAN CYSTECTOMY, BILATERAL SALPINGECTOMY,lysis of adhesions;  Surgeon: Princess Bruins, MD;  Location: [facility];  Service: Gynecology;  Laterality: Left;    There were no vitals filed for this visit.  Visit Diagnosis:  Right-sided low back pain with right-sided sciatica  Muscle spasm of back      Subjective Assessment - 08/24/15 1350    Subjective Was sore after last visit, today i'm a 3/10 when I move. See last visit for pain assessment.    Currently in Pain? Yes   Pain Score 3              [facility] Adult PT  Treatment/Exercise - 08/24/15 1354    Lumbar Exercises: Supine   Clam 20 reps   Clam Limitations bilat, unilat on ball    Bent Knee Raise 10 reps   Bridge 10 reps   Bridge Limitations cues for full articulation    Straight Leg Raise 10 reps   Straight Leg Raises Limitations used ball      A/P pelvis tilts, neutral, isometric TrA x 10 each used ball to de-stabilize.    [place]: 1.0 Hz, 1.2 W/cm2 for 8 min to Rt. Lumbar in sidelying, used biofreeze  manual, soft tissue work to [place]. Lumbar and glute med, deep pressure well tolerated.        PT Education - 08/24/15 1407    Education provided Yes   Education Details review HEP, [place]    Person(s) Educated Patient   Methods Explanation;Demonstration   Comprehension Verbalized understanding;Returned demonstration          PT Short Term Goals - 08/19/15 1006    PT SHORT TERM GOAL #1   Title be independent in initial HEP   Time 4   Period Weeks   Status On-going   PT SHORT TERM GOAL #2   Title report a 30% reduction in LBP with home and work tasks   Time 4   Period Weeks   Status On-going   PT SHORT TERM  GOAL #3   Title tolerate standing for 20 minutes at home and in the community    Time 4   Period Weeks   Status On-going   PT SHORT TERM GOAL #4   Title sit for 30 minutes at work without increased pain   Time 4   Period Weeks   Status On-going           PT Long Term Goals - 08/19/15 1013    PT LONG TERM GOAL #1   Title be independent in advanced HEP   Time 8   Period Weeks   Status On-going   PT LONG TERM GOAL #2   Title reduce FOTO to < or = to 43% limitation   Time 8   Period Weeks   Status On-going   PT LONG TERM GOAL #3   Title report a 60% reduction in LBP with home and work tasks   Time 8   Period Weeks   Status On-going   PT LONG TERM GOAL #4   Title tolerate standing/walking for 1 hour in the community without limitation   Period Weeks   Status On-going   PT LONG TERM GOAL #5   Title sit at work  for 1 hour with 1-2 mini breaks without increased LBP   Time 8   Period Weeks   Status On-going               Plan - 08/25/15 9242    Clinical Impression Statement Review of home program beneficial, reported full relief of symptoms after today's visit.  No goals met.    PT Next Visit Plan progress stab, manual and reduce inflammation rt. hip./lumbar   PT Home Exercise Plan prepilates, clam   Consulted and Agree with Plan of Care Patient        Problem List Patient Active Problem List   Diagnosis Date Noted  . Ovarian cyst 04/13/2014  . Appendicitis, acute 02/11/2014    PAA,JENNIFER 08/25/2015, 9:54 AM  [facility] [address] [place], [place], [address] Phone: [phone]   Fax:  [phone]  Name: Lisa Novak MRN: [id number] Date of Birth: 02-14-1971   Raeford Razor, PT 08/25/2015 10:01 AM Phone: [phone] Fax: [phone]

## 2015-08-29 ENCOUNTER — Ambulatory Visit: Payer: No Typology Code available for payment source | Attending: Neurosurgery | Admitting: Physical Therapy

## 2015-08-29 DIAGNOSIS — M62830 Muscle spasm of back: Secondary | ICD-10-CM | POA: Insufficient documentation

## 2015-08-29 DIAGNOSIS — R252 Cramp and spasm: Secondary | ICD-10-CM | POA: Diagnosis present

## 2015-08-29 DIAGNOSIS — M5441 Lumbago with sciatica, right side: Secondary | ICD-10-CM | POA: Insufficient documentation

## 2015-08-29 NOTE — Patient Instructions (Signed)
   Abductor Strength: Bridge Pose (Strap)   Make band wide enough to brace knees at hip width. Press into strap with knees, pulling apart, then return slowly. Hold for __1__ breaths. Repeat __10__ times.

## 2015-08-29 NOTE — Therapy (Signed)
[place], [place], [address] Phone: [phone]   Fax:  [phone]  Physical Therapy Treatment  Patient Details  Name: Lisa Novak MRN: [id number] Date of Birth: Jan 14, 1971 Referring Provider: Jovita Gamma, MD  Encounter Date: 08/29/2015      PT End of Session - 08/29/15 1337    Visit Number 5   Number of Visits 16   Date for PT Re-Evaluation 10/12/15   PT Start Time 0135   PT Stop Time 0217   PT Time Calculation (min) 42 min      Past Medical History  Diagnosis Date  . Hypertension   . GERD (gastroesophageal reflux disease)   . Umbilical hernia   . Diabetes type 2, controlled ([place])   . Diverticulitis   . SVD (spontaneous vaginal delivery)     x 3  . PONV (postoperative nausea and vomiting)     Past Surgical History  Procedure Laterality Date  . Abdominal hysterectomy    . Hernia repair    . Abdominoplasty    . Laparoscopic appendectomy N/A 02/11/2014    Procedure: APPENDECTOMY LAPAROSCOPIC;  Surgeon: Autumn Messing III, MD;  Location: [place];  Service: General;  Laterality: N/A;  . Appendectomy    . Wisdom tooth extraction    . Robotic assisted laparoscopic ovarian cystectomy Left 04/13/2014    Procedure: ROBOTIC ASSISTED LAPAROSCOPIC Left OVARIAN CYSTECTOMY, BILATERAL SALPINGECTOMY,lysis of adhesions;  Surgeon: Princess Bruins, MD;  Location: [facility];  Service: Gynecology;  Laterality: Left;    There were no vitals filed for this visit.  Visit Diagnosis:  Right-sided low back pain with right-sided sciatica  Cramp and spasm      Subjective Assessment - 08/29/15 1335    Currently in Pain? Yes   Pain Score 3    Pain Location Back   Pain Orientation Right   Pain Descriptors / Indicators Aching   Aggravating Factors  prolonged, sitting, standing walking   Pain Relieving Factors laying down, rest                         [facility] Adult PT Treatment/Exercise -  08/29/15 0001    Lumbar Exercises: Stretches   Piriformis Stretch 3 reps;30 seconds   Piriformis Stretch Limitations figure 4 and knee to shoulder   Lumbar Exercises: Supine   Clam 20 reps   Clam Limitations green band    Bent Knee Raise 20 reps   Bent Knee Raise Limitations level 2 with cues for neutral spine   Bridge 10 reps   Straight Leg Raise 10 reps   Straight Leg Raises Limitations with abset   Other Supine Lumbar Exercises Bridge with ball squeeze x 10, bridge with clam shell and green band   Ultrasound   Ultrasound Location rt multifidus   Ultrasound Parameters 1.4 w/cm2 36mhz 100% x 8 min   Ultrasound Goals Pain                PT Education - 08/29/15 1422    Education provided Yes   Education Details bridge with green band   Person(s) Educated Patient   Methods Explanation;Handout   Comprehension Verbalized understanding          PT Short Term Goals - 08/19/15 1006    PT SHORT TERM GOAL #1   Title be independent in initial HEP   Time 4   Period Weeks   Status On-going   PT SHORT TERM GOAL #2  Title report a 30% reduction in LBP with home and work tasks   Time 4   Period Weeks   Status On-going   PT SHORT TERM GOAL #3   Title tolerate standing for 20 minutes at home and in the community    Time 4   Period Weeks   Status On-going   PT SHORT TERM GOAL #4   Title sit for 30 minutes at work without increased pain   Time 4   Period Weeks   Status On-going           PT Long Term Goals - 08/19/15 1013    PT LONG TERM GOAL #1   Title be independent in advanced HEP   Time 8   Period Weeks   Status On-going   PT LONG TERM GOAL #2   Title reduce FOTO to < or = to 43% limitation   Time 8   Period Weeks   Status On-going   PT LONG TERM GOAL #3   Title report a 60% reduction in LBP with home and work tasks   Time 8   Period Weeks   Status On-going   PT LONG TERM GOAL #4   Title tolerate standing/walking for 1 hour in the community without  limitation   Period Weeks   Status On-going   PT LONG TERM GOAL #5   Title sit at work for 1 hour with 1-2 mini breaks without increased LBP   Time 8   Period Weeks   Status On-going               Plan - 08/29/15 1413    Clinical Impression Statement Progressed supine core exercises. Increased pain with single leg bridge exercises. Progressed HEP to include bridge with green band clam. Progressed to supine scissors level 2 with good control in clinic. [place] repeated per previous as pt reported it was beneficial. Pt scheduled for dry needling next visit. Pain mostly right multifidus, right lumbar and right glut/piriformis. Pt will continue to benefit from skilled PT to improve the folowing deficits: Right sided low back pain with right sciatica, cramps and spasm.    PT Next Visit Plan dry needling rt lumbar and glut/piriformis        Problem List Patient Active Problem List   Diagnosis Date Noted  . Ovarian cyst 04/13/2014  . Appendicitis, acute 02/11/2014    Dorene Ar, PTA 08/29/2015, 2:23 PM  [facility] [address] [place], [place], [address] Phone: [phone]   Fax:  [phone]  Name: Aubrionna Holeman MRN: [id number] Date of Birth: [birth year]

## 2015-08-30 ENCOUNTER — Encounter: Payer: No Typology Code available for payment source | Admitting: Physical Therapy

## 2015-09-01 ENCOUNTER — Ambulatory Visit: Payer: No Typology Code available for payment source | Admitting: Physical Therapy

## 2015-09-01 DIAGNOSIS — M5441 Lumbago with sciatica, right side: Secondary | ICD-10-CM | POA: Diagnosis not present

## 2015-09-01 DIAGNOSIS — R252 Cramp and spasm: Secondary | ICD-10-CM

## 2015-09-01 NOTE — Therapy (Signed)
[place], [place], [address] Phone: [phone]   Fax:  [phone]  Physical Therapy Treatment  Patient Details  Name: Lisa Novak MRN: [id number] Date of Birth: 1970/11/20 Referring Provider: Jovita Gamma, MD  Encounter Date: 09/01/2015      PT End of Session - 09/01/15 1152    Visit Number 6   Number of Visits 16   Date for PT Re-Evaluation 10/12/15   PT Start Time [id number]  pt was 8 minutes late   PT Stop Time 1232   PT Time Calculation (min) 39 min   Activity Tolerance Patient tolerated treatment well   Behavior During Therapy [facility] for tasks assessed/performed      Past Medical History  Diagnosis Date  . Hypertension   . GERD (gastroesophageal reflux disease)   . Umbilical hernia   . Diabetes type 2, controlled ([place])   . Diverticulitis   . SVD (spontaneous vaginal delivery)     x 3  . PONV (postoperative nausea and vomiting)     Past Surgical History  Procedure Laterality Date  . Abdominal hysterectomy    . Hernia repair    . Abdominoplasty    . Laparoscopic appendectomy N/A 02/11/2014    Procedure: APPENDECTOMY LAPAROSCOPIC;  Surgeon: Autumn Messing III, MD;  Location: [place];  Service: General;  Laterality: N/A;  . Appendectomy    . Wisdom tooth extraction    . Robotic assisted laparoscopic ovarian cystectomy Left 04/13/2014    Procedure: ROBOTIC ASSISTED LAPAROSCOPIC Left OVARIAN CYSTECTOMY, BILATERAL SALPINGECTOMY,lysis of adhesions;  Surgeon: Princess Bruins, MD;  Location: [facility];  Service: Gynecology;  Laterality: Left;    There were no vitals filed for this visit.  Visit Diagnosis:  Right-sided low back pain with right-sided sciatica  Cramp and spasm      Subjective Assessment - 09/01/15 1152    Subjective " I am still feeling the same as last time"    Currently in Pain? Yes   Pain Score 3    Pain Location Back   Pain Orientation Right   Pain Descriptors / Indicators  Tightness   Pain Type Chronic pain   Pain Onset More than a month ago   Pain Frequency Constant   Aggravating Factors  standing, walking   Pain Relieving Factors laying down, resting                         [facility] Adult PT Treatment/Exercise - 09/01/15 0001    Self-Care   Self-Care Other Self-Care Comments   Other Self-Care Comments  manual trigger point release using tennis ball to relieve tension in the glute medius/ minimus pushing agains the wall.    Lumbar Exercises: Stretches   Prone Mid Back Stretch 2 reps;30 seconds  also standing holding onto power tower 2 x 30 sec   ITB Stretch 1 rep;30 seconds   Manual Therapy   Manual therapy comments manual trigger point release x 2 of glute medius  educated how to do it at home   Joint Mobilization long axis distraction of the RLE grade 3-4   pt reported immediate relief                PT Education - 09/01/15 1257    Education provided Yes   Education Details anatomy of the hips and the back and rationale for treatment, and causes of pain    Person(s) Educated Patient   Methods Explanation  Comprehension Verbalized understanding          PT Short Term Goals - 08/19/15 1006    PT SHORT TERM GOAL #1   Title be independent in initial HEP   Time 4   Period Weeks   Status On-going   PT SHORT TERM GOAL #2   Title report a 30% reduction in LBP with home and work tasks   Time 4   Period Weeks   Status On-going   PT SHORT TERM GOAL #3   Title tolerate standing for 20 minutes at home and in the community    Time 4   Period Weeks   Status On-going   PT SHORT TERM GOAL #4   Title sit for 30 minutes at work without increased pain   Time 4   Period Weeks   Status On-going           PT Long Term Goals - 08/19/15 1013    PT LONG TERM GOAL #1   Title be independent in advanced HEP   Time 8   Period Weeks   Status On-going   PT LONG TERM GOAL #2   Title reduce FOTO to < or = to 43% limitation    Time 8   Period Weeks   Status On-going   PT LONG TERM GOAL #3   Title report a 60% reduction in LBP with home and work tasks   Time 8   Period Weeks   Status On-going   PT LONG TERM GOAL #4   Title tolerate standing/walking for 1 hour in the community without limitation   Period Weeks   Status On-going   PT LONG TERM GOAL #5   Title sit at work for 1 hour with 1-2 mini breaks without increased LBP   Time 8   Period Weeks   Status On-going               Plan - 09/01/15 1259    Clinical Impression Statement Anisah reported inital pain was about the same as last session. further assessment revealed elevated ASIS, illiac crest and Greater trochanter inidicating upslip on the R. performed manual long axis distraction of the hip which she reported immediate relief. demonstrated and educated about manual trigger point release. Held off on DN today to assess how treatment went with manual  today. pt declined MHP today.    Pt will benefit from skilled therapeutic intervention in order to improve on the following deficits --  right sided low back pain without sciatica, cramps and spasm   PT Next Visit Plan assess response to Manual, DN PRN, modalites PRN, core/hip strengthening, assess goals   Consulted and Agree with Plan of Care Patient        Problem List Patient Active Problem List   Diagnosis Date Noted  . Ovarian cyst 04/13/2014  . Appendicitis, acute 02/11/2014   Starr Lake PT, DPT, LAT, ATC  09/01/2015  1:03 PM      [facility] [address] [place], [place], [address] Phone: [phone]   Fax:  [phone]  Name: Lisa Novak MRN: [id number] Date of Birth: 1970-12-14

## 2015-09-06 ENCOUNTER — Ambulatory Visit: Payer: No Typology Code available for payment source | Admitting: Physical Therapy

## 2015-09-06 DIAGNOSIS — M5441 Lumbago with sciatica, right side: Secondary | ICD-10-CM | POA: Diagnosis not present

## 2015-09-06 DIAGNOSIS — R252 Cramp and spasm: Secondary | ICD-10-CM

## 2015-09-06 NOTE — Patient Instructions (Addendum)
Clam    Lie on side, legs bent 90. Open top knee to ceiling, rotating leg outward. Touch toes to ankle of bottom leg. Close knees, rotating leg inward. Maintain hip position. Repeat _10x 3___ times. Repeat on other side. Do _2___ sessions per day.   Buttocks    Lying face down, inhale. Keeping leg straight, exhale while lifting leg just off floor. Hold 1 count. Slowly return to starting position. Repeat __10x2__ times each leg. Do __1__ sets per session. Do __2__ sessions per day.

## 2015-09-06 NOTE — Therapy (Signed)
[place], [place], [address] Phone: [phone]   Fax:  [phone]  Physical Therapy Treatment  Patient Details  Name: Lisa Novak MRN: [id number] Date of Birth: [birth year] Referring Provider: Jovita Gamma, MD  Encounter Date: 09/06/2015      PT End of Session - 09/06/15 1120    Visit Number 7   Number of Visits 16   Date for PT Re-Evaluation 10/12/15   PT Start Time 1106   PT Stop Time 1144   PT Time Calculation (min) 38 min      Past Medical History  Diagnosis Date  . Hypertension   . GERD (gastroesophageal reflux disease)   . Umbilical hernia   . Diabetes type 2, controlled ([place])   . Diverticulitis   . SVD (spontaneous vaginal delivery)     x 3  . PONV (postoperative nausea and vomiting)     Past Surgical History  Procedure Laterality Date  . Abdominal hysterectomy    . Hernia repair    . Abdominoplasty    . Laparoscopic appendectomy N/A 02/11/2014    Procedure: APPENDECTOMY LAPAROSCOPIC;  Surgeon: Autumn Messing III, MD;  Location: [place];  Service: General;  Laterality: N/A;  . Appendectomy    . Wisdom tooth extraction    . Robotic assisted laparoscopic ovarian cystectomy Left 04/13/2014    Procedure: ROBOTIC ASSISTED LAPAROSCOPIC Left OVARIAN CYSTECTOMY, BILATERAL SALPINGECTOMY,lysis of adhesions;  Surgeon: Princess Bruins, MD;  Location: [facility];  Service: Gynecology;  Laterality: Left;    There were no vitals filed for this visit.      Subjective Assessment - 09/06/15 1120    Subjective I feel alot better. Just a little tiny nagging pain. Not really a pain. It's not keeping me from functioning.    Currently in Pain? No/denies            [facility] PT Assessment - 09/06/15 0001    ROM / Strength   AROM / PROM / Strength Strength   Strength   Strength Assessment Site Hip   Right/Left Hip Right;Left   Right Hip Extension 4-/5   Right Hip ABduction 4-/5   Left Hip  Extension 4-/5   Left Hip ABduction 5/5                     [facility] Adult PT Treatment/Exercise - 09/06/15 0001    Lumbar Exercises: Stretches   Piriformis Stretch Limitations R QL stretch side lying oer towel x 1 minute   Knee/Hip Exercises: Sidelying   Clams x 20 right   Other Sidelying Knee/Hip Exercises x 20 reverse clams   Knee/Hip Exercises: Prone   Hip Extension 10 reps;2 sets   Hip Extension Limitations bilateral   Manual Therapy   Joint Mobilization long axis distraction of the RLE grade 3-4     Standing glute med activation and hip leveling x 20             PT Education - 09/06/15 1140    Education provided Yes   Education Details Hip clam and reverse clam, prone hip extension   Person(s) Educated Patient   Methods Explanation;Handout   Comprehension Verbalized understanding          PT Short Term Goals - 09/06/15 1123    PT SHORT TERM GOAL #1   Title be independent in initial HEP   Time 4   Period Weeks   Status Achieved   PT SHORT TERM GOAL #2  Title report a 30% reduction in LBP with home and work tasks   Status Achieved   PT [place] #3   Title tolerate standing for 20 minutes at home and in the community    Status Achieved   PT SHORT TERM GOAL #4   Title sit for 30 minutes at work without increased pain   Status Achieved           PT Long Term Goals - 09/06/15 1125    PT LONG TERM GOAL #1   Title be independent in advanced HEP   Time 8   Period Weeks   Status On-going   PT LONG TERM GOAL #2   Title reduce FOTO to < or = to 43% limitation   Time 8   Period Weeks   Status On-going   PT LONG TERM GOAL #3   Title report a 60% reduction in LBP with home and work tasks   Time 8   Period Weeks   Status On-going   PT LONG TERM GOAL #4   Title tolerate standing/walking for 1 hour in the community without limitation   Time 8   Period Weeks   Status Achieved   PT LONG TERM GOAL #5   Title sit at work for 1 hour  with 1-2 mini breaks without increased LBP   Time 8   Period Weeks   Status Achieved               Plan - 09/06/15 1140    Clinical Impression Statement Pt reports decreased pain following last treatment of long axis hip distraction. STG# 1,2,3,4 MET, LTG#3, #4 Met.  MMT revealed glute max and med weakness R>L. Instructed pt in [place] clamshell exercises #1, #2 and prone hip extension for HEP. She continues to demonstrate higher right hip in standing. Instructed Glute med activation in partial SLS. Also instrcted in right QL stretch to lengthen right side.    Rehab Potential Good   PT Frequency 2x / week   PT Duration 8 weeks   PT Treatment/Interventions ADLs/Self Care Home Management;Cryotherapy;Electrical Stimulation;Moist Heat;Therapeutic exercise;Therapeutic activities;Manual techniques;Dry needling;Patient/family education;Ultrasound;Functional mobility training;Neuromuscular re-education;Passive range of motion   PT Next Visit Plan hip strength-progress [place] clam shell , add resistance to prone hip extension, supine bridge series?, check step length normalize gait      Patient will benefit from skilled therapeutic intervention in order to improve the following deficits and impairments:  Pain, Increased muscle spasms, Decreased activity tolerance  Visit Diagnosis: Cramp and spasm  Right-sided low back pain with right-sided sciatica     Problem List Patient Active Problem List   Diagnosis Date Noted  . Ovarian cyst 04/13/2014  . Appendicitis, acute 02/11/2014    Dorene Ar, PTA 09/06/2015, 1:47 PM  [facility] [address] [place], [place], [address] Phone: [phone]   Fax:  [phone]  Name: Lisa Novak MRN: [id number] Date of Birth: March 29, 1971

## 2015-09-06 NOTE — Addendum Note (Signed)
Addended by: Larey Days on: 09/06/2015 04:02 PM   Modules accepted: Orders

## 2015-09-13 ENCOUNTER — Ambulatory Visit: Payer: No Typology Code available for payment source | Admitting: Physical Therapy

## 2015-09-13 DIAGNOSIS — R252 Cramp and spasm: Secondary | ICD-10-CM

## 2015-09-13 DIAGNOSIS — M62830 Muscle spasm of back: Secondary | ICD-10-CM

## 2015-09-13 DIAGNOSIS — M5441 Lumbago with sciatica, right side: Secondary | ICD-10-CM | POA: Diagnosis not present

## 2015-09-13 NOTE — Therapy (Signed)
[place], [place], [address] Phone: [phone]   Fax:  [phone]  Physical Therapy Treatment  Patient Details  Name: Lisa Novak MRN: [id number] Date of Birth: 04-12-1971 Referring Provider: Jovita Gamma, MD  Encounter Date: 09/13/2015      PT End of Session - 09/13/15 1032    Visit Number 8   Number of Visits 16   Date for PT Re-Evaluation 10/12/15   PT Start Time 1020   PT Stop Time 1110   PT Time Calculation (min) 50 min   Activity Tolerance Patient tolerated treatment well   Behavior During Therapy [facility] for tasks assessed/performed      Past Medical History  Diagnosis Date  . Hypertension   . GERD (gastroesophageal reflux disease)   . Umbilical hernia   . Diabetes type 2, controlled ([place])   . Diverticulitis   . SVD (spontaneous vaginal delivery)     x 3  . PONV (postoperative nausea and vomiting)     Past Surgical History  Procedure Laterality Date  . Abdominal hysterectomy    . Hernia repair    . Abdominoplasty    . Laparoscopic appendectomy N/A 02/11/2014    Procedure: APPENDECTOMY LAPAROSCOPIC;  Surgeon: Autumn Messing III, MD;  Location: [place];  Service: General;  Laterality: N/A;  . Appendectomy    . Wisdom tooth extraction    . Robotic assisted laparoscopic ovarian cystectomy Left 04/13/2014    Procedure: ROBOTIC ASSISTED LAPAROSCOPIC Left OVARIAN CYSTECTOMY, BILATERAL SALPINGECTOMY,lysis of adhesions;  Surgeon: Princess Bruins, MD;  Location: [facility];  Service: Gynecology;  Laterality: Left;    There were no vitals filed for this visit.      Subjective Assessment - 09/13/15 1030    Subjective I think I flared things up as I went on a 3 mile hike.     Currently in Pain? No/denies         Started with long leg traction to correct upslip: Rt. PSIS, ASIS and iliac crest all higher      [facility] Adult PT Treatment/Exercise - 09/13/15 1046    Lumbar Exercises: Supine    Other Supine Lumbar Exercises Pilates Refomer: Footwork double and single leg, bridging and feet in straps see below for notes.         Pilates Reformer used for LE/core strength, postural strength, lumbopelvic disassociation and core control.  Exercises included: Footwork 2 Red 1 blue variations for alignment Bridging x 8 cues to articulated, min pain Feet in Straps Yellow and Red Arcs circles and squats.  Sidelying leg work for hip strength, full ROM clams, 2 Red springs press out parallel and with turn out       PT Education - 09/13/15 1230    Education provided Yes   Education Details PIlates Reformer, stabilization   Person(s) Educated Patient   Methods Explanation;Demonstration   Comprehension Verbalized understanding;Returned demonstration;Need further instruction          PT Short Term Goals - 09/13/15 1052    PT SHORT TERM GOAL #1   Title be independent in initial HEP   Status Achieved   PT SHORT TERM GOAL #2   Title report a 30% reduction in LBP with home and work tasks   Status Achieved   PT SHORT TERM GOAL #3   Title tolerate standing for 20 minutes at home and in the community    Status Achieved   PT [place] #4   Title sit for  30 minutes at work without increased pain   Status Achieved           PT Long Term Goals - 09/13/15 1052    PT LONG TERM GOAL #1   Title be independent in advanced HEP   Status On-going   PT LONG TERM GOAL #2   Title reduce FOTO to < or = to 43% limitation   Status Unable to assess   PT LONG TERM GOAL #3   Title report a 60% reduction in LBP with home and work tasks   Baseline 50%   Status Partially Met   PT LONG TERM GOAL #4   Title tolerate standing/walking for 1 hour in the community without limitation   Status Achieved   PT LONG TERM GOAL #5   Title sit at work for 1 hour with 1-2 mini breaks without increased LBP   Status Achieved               Plan - 09/13/15 1230    Clinical Impression  Statement Pt with flare up over the weekend.  Explained instability of LS spine and SIJ as possible cause when walking on uneven terrain.  Worked on LE strength symmetry and core activation with Refomer today.  No increased pain.  Tends to posteriorly tilt with hip flexion.  Renew/cont with more PT as she has improved but cont to have pain with standing.    PT Next Visit Plan consider modifiying/adding goals.  How was Pilates reformer? hip strength-progress commerford clam shell , add resistance to prone hip extension, supine bridge series?, check step length normalize gait   PT Home Exercise Plan prepilates, clam   Consulted and Agree with Plan of Care Patient      Patient will benefit from skilled therapeutic intervention in order to improve the following deficits and impairments:  Pain, Increased muscle spasms, Decreased activity tolerance  Visit Diagnosis: Cramp and spasm  Right-sided low back pain with right-sided sciatica  Muscle spasm of back     Problem List Patient Active Problem List   Diagnosis Date Noted  . Ovarian cyst 04/13/2014  . Appendicitis, acute 02/11/2014    Lisa Novak 09/13/2015, 12:35 PM  [facility] [address] [place], [place], [address] Phone: [phone]   Fax:  [phone]  Name: Lisa Novak MRN: [id number] Date of Birth: August 09, 1970  Raeford Razor, PT 09/13/2015 12:35 PM Phone: [phone] Fax: [phone]

## 2015-09-16 ENCOUNTER — Ambulatory Visit: Payer: No Typology Code available for payment source | Admitting: Physical Therapy

## 2015-09-16 DIAGNOSIS — R252 Cramp and spasm: Secondary | ICD-10-CM

## 2015-09-16 DIAGNOSIS — M5441 Lumbago with sciatica, right side: Secondary | ICD-10-CM

## 2015-09-16 DIAGNOSIS — M62830 Muscle spasm of back: Secondary | ICD-10-CM

## 2015-09-16 NOTE — Therapy (Signed)
[place], [place], [address] Phone: [phone]   Fax:  [phone]  Physical Therapy Treatment  Patient Details  Name: Lisa Novak MRN: [id number] Date of Birth: 03-01-1971 Referring Provider: Jovita Gamma, MD  Encounter Date: 09/16/2015      PT End of Session - 09/16/15 0930    Visit Number 9   Number of Visits 16   Date for PT Re-Evaluation 10/12/15   PT Start Time 0846   PT Stop Time 0936   PT Time Calculation (min) 50 min   Activity Tolerance Patient tolerated treatment well   Behavior During Therapy [facility] for tasks assessed/performed      Past Medical History  Diagnosis Date  . Hypertension   . GERD (gastroesophageal reflux disease)   . Umbilical hernia   . Diabetes type 2, controlled ([place])   . Diverticulitis   . SVD (spontaneous vaginal delivery)     x 3  . PONV (postoperative nausea and vomiting)     Past Surgical History  Procedure Laterality Date  . Abdominal hysterectomy    . Hernia repair    . Abdominoplasty    . Laparoscopic appendectomy N/A 02/11/2014    Procedure: APPENDECTOMY LAPAROSCOPIC;  Surgeon: Autumn Messing III, MD;  Location: [place];  Service: General;  Laterality: N/A;  . Appendectomy    . Wisdom tooth extraction    . Robotic assisted laparoscopic ovarian cystectomy Left 04/13/2014    Procedure: ROBOTIC ASSISTED LAPAROSCOPIC Left OVARIAN CYSTECTOMY, BILATERAL SALPINGECTOMY,lysis of adhesions;  Surgeon: Princess Bruins, MD;  Location: [facility];  Service: Gynecology;  Laterality: Left;    There were no vitals filed for this visit.      Subjective Assessment - 09/16/15 0854    Subjective I have had a rough couple of days. 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  Today, back to baseline 2/10-3/10.    Currently in Pain? Yes   Pain Score 3    Pain Location Back   Pain Orientation Right   Pain Descriptors / Indicators Sore   Pain Type Chronic pain   Pain Onset More than a month ago   Pain Frequency Intermittent                         [facility] Adult PT Treatment/Exercise - 09/16/15 0859    Lumbar Exercises: Supine   Ab Set 10 reps   Clam 20 reps   Clam Limitations bilat and unilat.  sacrum on ball   PPT helps keep abs contracted    Bent Knee Raise 20 reps   Straight Leg Raise 10 reps   Straight Leg Raises Limitations with UE on ball for core    Modalities   Modalities Cryotherapy   Cryotherapy   Number Minutes Cryotherapy 8 Minutes   Cryotherapy Location Lumbar Spine   Type of Cryotherapy Ice pack   Manual Therapy   Joint Mobilization L4-L5-S1 rotational mobs gr 1-2   Soft tissue mobilization Rt>Lt lumbar parapsinals , Rt. glute med  mod to deep pressure tolerated                 PT Education - 09/16/15 1214    Education provided Yes   Education Details reviewed spine anatomy and disc vs muscle pain , cold pack for inflammation   Person(s) Educated Patient  Methods Explanation   Comprehension Verbalized understanding          PT Short Term Goals - 09/13/15 1052    PT SHORT TERM GOAL #1   Title be independent in initial HEP   Status Achieved   PT SHORT TERM GOAL #2   Title report a 30% reduction in LBP with home and work tasks   Status Achieved   PT SHORT TERM GOAL #3   Title tolerate standing for 20 minutes at home and in the community    Status Achieved   PT [place] #4   Title sit for 30 minutes at work without increased pain   Status Achieved           PT Long Term Goals - 09/13/15 1052    PT LONG TERM GOAL #1   Title be independent in advanced HEP   Status On-going   PT LONG TERM GOAL #2   Title reduce FOTO to < or = to 43% limitation   Status Unable to assess   PT LONG TERM GOAL #3   Title report a 60% reduction in LBP with home and work tasks   Baseline 50%   Status  Partially Met   PT LONG TERM GOAL #4   Title tolerate standing/walking for 1 hour in the community without limitation   Status Achieved   PT LONG TERM GOAL #5   Title sit at work for 1 hour with 1-2 mini breaks without increased LBP   Status Achieved               Plan - 09/16/15 1215    Clinical Impression Statement Patient does report improvement of her ability to tolerate standing but she has done alot lately and noticed very localized pain Rt. low lumbar (L5-S1) just adjacent to spine.  Uses heat normally but today cold pack helped.  Looking forward to dry needling (had it on her neck once before).    PT Next Visit Plan FOTO, dry needling consider modifiying/adding goals.hip strength-progress commerford clam shell , add resistance to prone hip extension, supine bridge series?, check step length normalize gait   PT Home Exercise Plan prepilates, clam   Consulted and Agree with Plan of Care Patient      Patient will benefit from skilled therapeutic intervention in order to improve the following deficits and impairments:  Pain, Increased muscle spasms, Decreased activity tolerance  Visit Diagnosis: Cramp and spasm  Right-sided low back pain with right-sided sciatica  Muscle spasm of back     Problem List Patient Active Problem List   Diagnosis Date Noted  . Ovarian cyst 04/13/2014  . Appendicitis, acute 02/11/2014    PAA,JENNIFER 09/16/2015, 12:20 PM  [facility] [address] [place], [place], [address] Phone: [phone]   Fax:  [phone]  Name: Lisa Novak MRN: [id number] Date of Birth: Dec 28, 1970    Raeford Razor, PT 09/16/2015 12:20 PM Phone: [phone] Fax: [phone]

## 2015-09-20 ENCOUNTER — Ambulatory Visit: Payer: No Typology Code available for payment source | Admitting: Physical Therapy

## 2015-09-20 DIAGNOSIS — R252 Cramp and spasm: Secondary | ICD-10-CM

## 2015-09-20 DIAGNOSIS — M62830 Muscle spasm of back: Secondary | ICD-10-CM

## 2015-09-20 DIAGNOSIS — M5441 Lumbago with sciatica, right side: Secondary | ICD-10-CM

## 2015-09-20 NOTE — Therapy (Signed)
[place], [place], [address] Phone: [phone]   Fax:  [phone]  Physical Therapy Treatment  Patient Details  Name: Lisa Novak MRN: [id number] Date of Birth: 08-Jan-1971 Referring Provider: Jovita Gamma, MD  Encounter Date: 09/20/2015      PT End of Session - 09/20/15 1240    Visit Number 10   Number of Visits 16   Date for PT Re-Evaluation 10/12/15   PT Start Time 6811  pt arrived 10 min late   PT Stop Time 1248   PT Time Calculation (min) 53 min   Activity Tolerance Patient tolerated treatment well   Behavior During Therapy [facility] for tasks assessed/performed      Past Medical History  Diagnosis Date  . Hypertension   . GERD (gastroesophageal reflux disease)   . Umbilical hernia   . Diabetes type 2, controlled ([place])   . Diverticulitis   . SVD (spontaneous vaginal delivery)     x 3  . PONV (postoperative nausea and vomiting)     Past Surgical History  Procedure Laterality Date  . Abdominal hysterectomy    . Hernia repair    . Abdominoplasty    . Laparoscopic appendectomy N/A 02/11/2014    Procedure: APPENDECTOMY LAPAROSCOPIC;  Surgeon: Autumn Messing III, MD;  Location: [place];  Service: General;  Laterality: N/A;  . Appendectomy    . Wisdom tooth extraction    . Robotic assisted laparoscopic ovarian cystectomy Left 04/13/2014    Procedure: ROBOTIC ASSISTED LAPAROSCOPIC Left OVARIAN CYSTECTOMY, BILATERAL SALPINGECTOMY,lysis of adhesions;  Surgeon: Princess Bruins, MD;  Location: [facility];  Service: Gynecology;  Laterality: Left;    There were no vitals filed for this visit.      Subjective Assessment - 09/20/15 1159    Subjective "I have had some good and bad days, I took it easy this weekend"   Currently in Pain? Yes   Pain Score 1    Pain Location Back   Pain Orientation Right   Pain Descriptors / Indicators Sore   Pain Type Chronic pain   Pain Onset More than a month ago    Pain Frequency Intermittent   Aggravating Factors  prolonged standing, sitting,    Pain Relieving Factors laying down, heating,                          [facility] Adult PT Treatment/Exercise - 09/20/15 0001    Lumbar Exercises: Stretches   ITB Stretch 2 reps;30 seconds   Piriformis Stretch Limitations R QL stretching over bolster 2 x 30 with manual over pressure    Moist Heat Therapy   Number Minutes Moist Heat 10 Minutes   Moist Heat Location Other (comment);Hip  R in L sidelying over bolster for QL stretch   Manual Therapy   Soft tissue mobilization IASTM of R QL and glute medius/ minimus          Trigger Point Dry Needling - 09/20/15 1234    Consent Given? Yes   Education Handout Provided Yes  given previously   Muscles Treated Upper Body Quadratus Lumborum  glute medius/ Quadratus lomborum                PT Short Term Goals - 09/13/15 1052    PT SHORT TERM GOAL #1   Title be independent in initial HEP   Status Achieved   PT SHORT TERM GOAL #2   Title report a 30% reduction  in LBP with home and work tasks   Status Achieved   PT [place] #3   Title tolerate standing for 20 minutes at home and in the community    Status Achieved   PT SHORT TERM GOAL #4   Title sit for 30 minutes at work without increased pain   Status Achieved           PT Long Term Goals - 09/13/15 1052    PT LONG TERM GOAL #1   Title be independent in advanced HEP   Status On-going   PT LONG TERM GOAL #2   Title reduce FOTO to < or = to 43% limitation   Status Unable to assess   PT LONG TERM GOAL #3   Title report a 60% reduction in LBP with home and work tasks   Baseline 50%   Status Partially Met   PT LONG TERM GOAL #4   Title tolerate standing/walking for 1 hour in the community without limitation   Status Achieved   PT LONG TERM GOAL #5   Title sit at work for 1 hour with 1-2 mini breaks without increased LBP   Status Achieved                Plan - 09/20/15 1240    Clinical Impression Statement Lisa Novak reports she is doing better today. She provided consent for DN of the R QL and Glute medius where multiple twitches were palpable and decreased pain noted following STW and stretching. Used MHP post session with pt on stretch for QL.    PT Next Visit Plan FOTO, ass dry needling consider modifiying/adding goals.hip strength-progress commerford clam shell , add resistance to prone hip extension, supine bridge series?, check step length normalize gait   Consulted and Agree with Plan of Care Patient      Patient will benefit from skilled therapeutic intervention in order to improve the following deficits and impairments:  Pain, Increased muscle spasms, Decreased activity tolerance  Visit Diagnosis: Cramp and spasm  Right-sided low back pain with right-sided sciatica  Muscle spasm of back     Problem List Patient Active Problem List   Diagnosis Date Noted  . Ovarian cyst 04/13/2014  . Appendicitis, acute 02/11/2014   Starr Lake PT, DPT, LAT, ATC  09/20/2015  12:51 PM       [facility] [address] [place], [place], [address] Phone: [phone]   Fax:  [phone]  Name: Lisa Novak MRN: [id number] Date of Birth: 11-Oct-1970

## 2015-09-22 ENCOUNTER — Ambulatory Visit: Payer: No Typology Code available for payment source | Admitting: Physical Therapy

## 2015-09-22 DIAGNOSIS — M5441 Lumbago with sciatica, right side: Secondary | ICD-10-CM | POA: Diagnosis not present

## 2015-09-22 DIAGNOSIS — R252 Cramp and spasm: Secondary | ICD-10-CM

## 2015-09-22 DIAGNOSIS — M62830 Muscle spasm of back: Secondary | ICD-10-CM

## 2015-09-22 NOTE — Therapy (Signed)
[place], [place], [address] Phone: [phone]   Fax:  [phone]  Physical Therapy Treatment / Re-certification   Patient Details  Name: Lisa Novak MRN: [id number] Date of Birth: April 27, 1971 Referring Provider: Jovita Gamma, MD  Encounter Date: 09/22/2015      PT End of Session - 09/22/15 1215    Visit Number 11   Number of Visits 17   Date for PT Re-Evaluation 10/13/15   PT Start Time 1147   PT Stop Time [id number]   PT Time Calculation (min) 48 min   Activity Tolerance Patient tolerated treatment well   Behavior During Therapy [facility] for tasks assessed/performed      Past Medical History  Diagnosis Date  . Hypertension   . GERD (gastroesophageal reflux disease)   . Umbilical hernia   . Diabetes type 2, controlled ([place])   . Diverticulitis   . SVD (spontaneous vaginal delivery)     x 3  . PONV (postoperative nausea and vomiting)     Past Surgical History  Procedure Laterality Date  . Abdominal hysterectomy    . Hernia repair    . Abdominoplasty    . Laparoscopic appendectomy N/A 02/11/2014    Procedure: APPENDECTOMY LAPAROSCOPIC;  Surgeon: Autumn Messing III, MD;  Location: [place];  Service: General;  Laterality: N/A;  . Appendectomy    . Wisdom tooth extraction    . Robotic assisted laparoscopic ovarian cystectomy Left 04/13/2014    Procedure: ROBOTIC ASSISTED LAPAROSCOPIC Left OVARIAN CYSTECTOMY, BILATERAL SALPINGECTOMY,lysis of adhesions;  Surgeon: Princess Bruins, MD;  Location: [facility];  Service: Gynecology;  Laterality: Left;    There were no vitals filed for this visit.      Subjective Assessment - 09/22/15 1153    Subjective "doing better today just one small spot of soreness in the R low back, pt reported swelling in the R flank after last session but it has calmed down  today"     Currently in Pain? Yes   Pain Score 1    Pain Location Back   Pain Orientation Right   Pain  Descriptors / Indicators Sore   Pain Type Chronic pain   Pain Onset More than a month ago   Pain Frequency Intermittent            [facility] PT Assessment - 09/22/15 1219    Observation/Other Assessments   Focus on Therapeutic Outcomes (FOTO)  40% limited   AROM   Overall AROM  Within functional limits for tasks performed   Strength   Right Hip Extension 4-/5   Right Hip ABduction 4-/5   Left Hip Extension 4-/5   Left Hip ABduction 4/5                     [facility] Adult PT Treatment/Exercise - 09/22/15 1159    Self-Care   Other Self-Care Comments  educated benefit of continued strengthening of the hips to promote abductor/ extensor strength for walking/ standing activities.    Lumbar Exercises: Stretches   Pelvic Tilt 3 reps  x 10 seated on dyna disc   ITB Stretch 2 reps;30 seconds   Piriformis Stretch Limitations R QL stretching over bolster 2 x 30 with manual over pressure    Lumbar Exercises: Aerobic   Stationary Bike Nu-Step L6 x 5 min   Manual Therapy   Joint Mobilization RLE long axis distraction with L LE leg press 5 x 10 sec hold  PT Short Term Goals - 09/22/15 1223    PT SHORT TERM GOAL #1   Title be independent in initial HEP   Time 4   Period Weeks   Status Achieved   PT SHORT TERM GOAL #2   Title report a 30% reduction in LBP with home and work tasks   Time 4   Period Weeks   Status Achieved   PT SHORT TERM GOAL #3   Title tolerate standing for 20 minutes at home and in the community    Time 4   Period Weeks   Status Achieved   PT SHORT TERM GOAL #4   Title sit for 30 minutes at work without increased pain   Time 4   Period Weeks   Status Achieved           PT Long Term Goals - 09/22/15 1223    PT LONG TERM GOAL #1   Title be independent in advanced HEP   Period Weeks   Status On-going   PT LONG TERM GOAL #2   Title reduce FOTO to < or = to 43% limitation   Baseline 40% limitation   Time 8   Period  Weeks   Status Achieved   PT LONG TERM GOAL #3   Title report a 60% reduction in LBP with home and work tasks   Time 8   Period Weeks   Status Achieved   PT LONG TERM GOAL #4   Title tolerate standing/walking for 1 hour in the community without limitation   Time 8   Period Weeks   Status Achieved   PT LONG TERM GOAL #5   Title sit at work for 1 hour with 1-2 mini breaks without increased LBP   Time 8   Period Weeks   Status Achieved   Additional Long Term Goals   Additional Long Term Goals Yes   PT LONG TERM GOAL #6   Title pt will improve bil hip extension/ abduction strength to >/= 4/5 with </= 1/10 to assist with prolonged walking and standing activities    Time 8   Period Weeks   Status New             Patient will benefit from skilled therapeutic intervention in order to improve the following deficits and impairments:     Visit Diagnosis: Cramp and spasm  Right-sided low back pain with right-sided sciatica  Muscle spasm of back     Problem List Patient Active Problem List   Diagnosis Date Noted  . Ovarian cyst 04/13/2014  . Appendicitis, acute 02/11/2014   Starr Lake PT, DPT, LAT, ATC  09/22/2015  1:19 PM      [facility] [address] [place], [place], [address] Phone: [phone]   Fax:  [phone]  Name: Amberlyn Leise MRN: [id number] Date of Birth: Jul 04, 1970

## 2015-09-28 ENCOUNTER — Ambulatory Visit: Payer: No Typology Code available for payment source | Attending: Neurosurgery | Admitting: Physical Therapy

## 2015-09-28 DIAGNOSIS — M62830 Muscle spasm of back: Secondary | ICD-10-CM | POA: Diagnosis present

## 2015-09-28 DIAGNOSIS — R252 Cramp and spasm: Secondary | ICD-10-CM | POA: Diagnosis not present

## 2015-09-28 DIAGNOSIS — M5441 Lumbago with sciatica, right side: Secondary | ICD-10-CM | POA: Diagnosis present

## 2015-09-28 NOTE — Therapy (Signed)
[place], [place], [address] Phone: [phone]   Fax:  [phone]  Physical Therapy Treatment  Patient Details  Name: Lisa Novak MRN: [id number] Date of Birth: 1971/02/26 Referring Provider: Jovita Gamma, MD  Encounter Date: 09/28/2015      PT End of Session - 09/28/15 0932    Visit Number 12   Number of Visits 17   Date for PT Re-Evaluation 10/13/15   PT Start Time 0846   PT Stop Time 0943   PT Time Calculation (min) 57 min   Activity Tolerance Patient tolerated treatment well   Behavior During Therapy [facility] for tasks assessed/performed      Past Medical History  Diagnosis Date  . Hypertension   . GERD (gastroesophageal reflux disease)   . Umbilical hernia   . Diabetes type 2, controlled ([place])   . Diverticulitis   . SVD (spontaneous vaginal delivery)     x 3  . PONV (postoperative nausea and vomiting)     Past Surgical History  Procedure Laterality Date  . Abdominal hysterectomy    . Hernia repair    . Abdominoplasty    . Laparoscopic appendectomy N/A 02/11/2014    Procedure: APPENDECTOMY LAPAROSCOPIC;  Surgeon: Autumn Messing III, MD;  Location: [place];  Service: General;  Laterality: N/A;  . Appendectomy    . Wisdom tooth extraction    . Robotic assisted laparoscopic ovarian cystectomy Left 04/13/2014    Procedure: ROBOTIC ASSISTED LAPAROSCOPIC Left OVARIAN CYSTECTOMY, BILATERAL SALPINGECTOMY,lysis of adhesions;  Surgeon: Princess Bruins, MD;  Location: [facility];  Service: Gynecology;  Laterality: Left;    There were no vitals filed for this visit.      Subjective Assessment - 09/28/15 0856    Subjective Ive had some good days.  I do my pelvic tilts when i sit at work.  Pain is achy in my hip and outside of my thigh, hard to lie on Rt. side.    Currently in Pain? Yes   Pain Score 2    Pain Location Hip   Pain Orientation Right   Pain Descriptors / Indicators Aching   Pain Type  Chronic pain   Pain Onset More than a month ago   Pain Frequency Intermittent                         [facility] Adult PT Treatment/Exercise - 09/28/15 0901    Lumbar Exercises: Stretches   ITB Stretch 2 reps;30 seconds   Piriformis Stretch Limitations Rt. trunk    Lumbar Exercises: Quadruped   [place] 10 reps   Opposite Arm/Leg Raise Right arm/Left leg;Left arm/Right leg;10 reps   Opposite Arm/Leg Raise Limitations used foam under knee to destabilize, needs tactile cues to correct, hyperext    Knee/Hip Exercises: Stretches   Active Hamstring Stretch 2 reps;30 seconds   Hip Flexor Stretch 3 reps;30 seconds   Piriformis Stretch 2 reps;30 seconds   Moist Heat Therapy   Number Minutes Moist Heat 15 Minutes   Moist Heat Location Lumbar Spine   Electrical Stimulation   Electrical Stimulation Location Rt low lumbar    Electrical Stimulation Action IFC   Electrical Stimulation Parameters to tol   Electrical Stimulation Goals Pain   Manual Therapy   Joint Mobilization caudal pressure to iliac crest to elongate Rt. trunk, long axis traction to Rt LE.    Soft tissue mobilization Rt. QL and into glutes lateral Rt hip  PT Education - 09/28/15 1515    Education provided Yes   Education Details IFC    Person(s) Educated Patient   Methods Explanation   Comprehension Verbalized understanding          PT Short Term Goals - 09/22/15 1223    PT SHORT TERM GOAL #1   Title be independent in initial HEP   Time 4   Period Weeks   Status Achieved   PT SHORT TERM GOAL #2   Title report a 30% reduction in LBP with home and work tasks   Time 4   Period Weeks   Status Achieved   PT SHORT TERM GOAL #3   Title tolerate standing for 20 minutes at home and in the community    Time 4   Period Weeks   Status Achieved   PT SHORT TERM GOAL #4   Title sit for 30 minutes at work without increased pain   Time 4   Period Weeks   Status Achieved            PT Long Term Goals - 09/22/15 1223    PT LONG TERM GOAL #1   Title be independent in advanced HEP   Period Weeks   Status On-going   PT LONG TERM GOAL #2   Title reduce FOTO to < or = to 43% limitation   Baseline 40% limitation   Time 8   Period Weeks   Status Achieved   PT LONG TERM GOAL #3   Title report a 60% reduction in LBP with home and work tasks   Time 8   Period Weeks   Status Achieved   PT LONG TERM GOAL #4   Title tolerate standing/walking for 1 hour in the community without limitation   Time 8   Period Weeks   Status Achieved   PT LONG TERM GOAL #5   Title sit at work for 1 hour with 1-2 mini breaks without increased LBP   Time 8   Period Weeks   Status Achieved   Additional Long Term Goals   Additional Long Term Goals Yes   PT LONG TERM GOAL #6   Title pt will improve bil hip extension/ abduction strength to >/= 4/5 with </= 1/10 to assist with prolonged walking and standing activities    Time 8   Period Weeks   Status New               Plan - 09/28/15 1515    Clinical Impression Statement Patient cont to have back pain which refers to Rt. hip bone.  Able to do some walking at the race the other day.  Loses core control with hip extension in prone and quadruped.  Needs core strength   PT Next Visit Plan core stab (quadruped, prone, multifidus) and assess IFC, repeat if benefit   PT Home Exercise Plan seated pelvic tilt, childs pose, hip flexor stretch   Consulted and Agree with Plan of Care Patient      Patient will benefit from skilled therapeutic intervention in order to improve the following deficits and impairments:  Pain, Increased muscle spasms, Decreased activity tolerance  Visit Diagnosis: Cramp and spasm  Right-sided low back pain with right-sided sciatica  Muscle spasm of back     Problem List Patient Active Problem List   Diagnosis Date Noted  . Ovarian cyst 04/13/2014  . Appendicitis, acute 02/11/2014     Django Nguyen 09/28/2015, 3:19 PM  [facility]  [place], [place], [address] Phone: [phone]   Fax:  [phone]  Name: Lisa Novak MRN: [id number] Date of Birth: 09/10/1970    Raeford Razor, PT 09/28/2015 3:19 PM Phone: [phone] Fax: [phone]

## 2015-09-30 ENCOUNTER — Ambulatory Visit: Payer: No Typology Code available for payment source | Admitting: Physical Therapy

## 2015-10-05 ENCOUNTER — Ambulatory Visit: Payer: No Typology Code available for payment source | Admitting: Physical Therapy

## 2015-10-05 DIAGNOSIS — M5441 Lumbago with sciatica, right side: Secondary | ICD-10-CM

## 2015-10-05 DIAGNOSIS — R252 Cramp and spasm: Secondary | ICD-10-CM

## 2015-10-05 NOTE — Therapy (Signed)
[place], [place], [address] Phone: [phone]   Fax:  [phone]  Physical Therapy Treatment  Patient Details  Name: Lisa Novak MRN: [id number] Date of Birth: 29-Jun-1970 Referring Provider: Jovita Gamma, MD  Encounter Date: 10/05/2015      PT End of Session - 10/05/15 0945    Visit Number 13   Number of Visits 17   Date for PT Re-Evaluation 10/13/15   PT Start Time 0849   PT Stop Time 0945   PT Time Calculation (min) 56 min   Activity Tolerance Patient tolerated treatment well   Behavior During Therapy [facility] for tasks assessed/performed      Past Medical History  Diagnosis Date  . Hypertension   . GERD (gastroesophageal reflux disease)   . Umbilical hernia   . Diabetes type 2, controlled ([place])   . Diverticulitis   . SVD (spontaneous vaginal delivery)     x 3  . PONV (postoperative nausea and vomiting)     Past Surgical History  Procedure Laterality Date  . Abdominal hysterectomy    . Hernia repair    . Abdominoplasty    . Laparoscopic appendectomy N/A 02/11/2014    Procedure: APPENDECTOMY LAPAROSCOPIC;  Surgeon: Autumn Messing III, MD;  Location: [facility];  Service: General;  Laterality: N/A;  . Appendectomy    . Wisdom tooth extraction    . Robotic assisted laparoscopic ovarian cystectomy Left 04/13/2014    Procedure: ROBOTIC ASSISTED LAPAROSCOPIC Left OVARIAN CYSTECTOMY, BILATERAL SALPINGECTOMY,lysis of adhesions;  Surgeon: Princess Bruins, MD;  Location: [facility];  Service: Gynecology;  Laterality: Left;    There were no vitals filed for this visit.      Subjective Assessment - 10/05/15 0850    Subjective I've had some more achiness in the hip that seems to be getting worse"   Currently in Pain? Yes   Pain Score 4    Pain Orientation Right   Pain Type Chronic pain   Pain Onset More than a month ago   Pain Frequency Constant   Aggravating Factors  prolonged standing, sitting    Pain Relieving Factors laying down, heating,                          [facility] Adult PT Treatment/Exercise - 10/05/15 0001    Self-Care   Self-Care Posture   Posture posture education with proper spinal curves and lifting and carrying mechanics, demonstrating and providing handout   Lumbar Exercises: Stretches   Active Hamstring Stretch 5 reps;30 seconds  contract relax with 10 sec hold   Lower Trunk Rotation 2 reps;20 seconds   ITB Stretch 2 reps;30 seconds   Piriformis Stretch 2 reps;30 seconds   Lumbar Exercises: Supine   Straight Leg Raise 10 reps;2 seconds  2 x RLE only   Other Supine Lumbar Exercises table top positions, with alteranating knee taps 3 x 10   Moist Heat Therapy   Number Minutes Moist Heat 10 Minutes   Moist Heat Location Lumbar Spine  prone   Manual Therapy   Manual Therapy Muscle Energy Technique   Joint Mobilization Long axis distraction of the RLE with LLE perform leg press   Muscle Energy Technique Prone resisted R hip flexor 2 x 5 with 10 sec                 PT Education - 10/05/15 0944    Education provided Yes   Education Details  HEP review of core strengthening, and updated with hamstring stretching and SLR, posture education, innominate rotation and anatomy involved   Person(s) Educated Patient   Methods Explanation;Handout   Comprehension Verbalized understanding          PT Short Term Goals - 09/22/15 1223    PT SHORT TERM GOAL #1   Title be independent in initial HEP   Time 4   Period Weeks   Status Achieved   PT SHORT TERM GOAL #2   Title report a 30% reduction in LBP with home and work tasks   Time 4   Period Weeks   Status Achieved   PT SHORT TERM GOAL #3   Title tolerate standing for 20 minutes at home and in the community    Time 4   Period Weeks   Status Achieved   PT SHORT TERM GOAL #4   Title sit for 30 minutes at work without increased pain   Time 4   Period Weeks   Status Achieved            PT Long Term Goals - 09/22/15 1223    PT LONG TERM GOAL #1   Title be independent in advanced HEP   Period Weeks   Status On-going   PT LONG TERM GOAL #2   Title reduce FOTO to < or = to 43% limitation   Baseline 40% limitation   Time 8   Period Weeks   Status Achieved   PT LONG TERM GOAL #3   Title report a 60% reduction in LBP with home and work tasks   Time 8   Period Weeks   Status Achieved   PT LONG TERM GOAL #4   Title tolerate standing/walking for 1 hour in the community without limitation   Time 8   Period Weeks   Status Achieved   PT LONG TERM GOAL #5   Title sit at work for 1 hour with 1-2 mini breaks without increased LBP   Time 8   Period Weeks   Status Achieved   Additional Long Term Goals   Additional Long Term Goals Yes   PT LONG TERM GOAL #6   Title pt will improve bil hip extension/ abduction strength to >/= 4/5 with </= 1/10 to assist with prolonged walking and standing activities    Time 8   Period Weeks   Status New               Plan - 10/05/15 0945    Clinical Impression Statement Mrs. Oehler reports increased pain in the R low back located at the R PSIS. upon further assessment she demonstrates a posterior rotation. following stretching and R resisted hip flexor MET she reported pain dropped to 2/10. she was able to perform core strengthening and  seated hamstring stretching which she reported relieved tension in the low back. utilized MHP post session for soreness today.    PT Next Visit Plan core stab (quadruped, prone, multifidus) and assess IFC, repeat if benefit, Posterior rotated innominate on R Re-assess   PT Home Exercise Plan supine/ seated hamstring stretch, SLR, posture education   Consulted and Agree with Plan of Care Patient      Patient will benefit from skilled therapeutic intervention in order to improve the following deficits and impairments:  Pain, Increased muscle spasms, Decreased activity tolerance  Visit  Diagnosis: Cramp and spasm  Right-sided low back pain with right-sided sciatica     Problem List Patient Active Problem  List   Diagnosis Date Noted  . Ovarian cyst 04/13/2014  . Appendicitis, acute 02/11/2014   Starr Lake PT, DPT, LAT, ATC  10/05/2015  9:53 AM      [facility] [address] [place], [place], [address] Phone: [phone]   Fax:  [phone]  Name: Lisa Novak MRN: [id number] Date of Birth: November 10, 1970

## 2015-10-05 NOTE — Patient Instructions (Signed)

## 2015-10-07 ENCOUNTER — Ambulatory Visit: Payer: No Typology Code available for payment source | Admitting: Physical Therapy

## 2015-10-07 DIAGNOSIS — R252 Cramp and spasm: Secondary | ICD-10-CM | POA: Diagnosis not present

## 2015-10-07 DIAGNOSIS — M62830 Muscle spasm of back: Secondary | ICD-10-CM

## 2015-10-07 DIAGNOSIS — M5441 Lumbago with sciatica, right side: Secondary | ICD-10-CM

## 2015-10-07 NOTE — Therapy (Addendum)
[place], [place], [address] Phone: [phone]   Fax:  [phone]  Physical Therapy Treatment / Discharge Note  Patient Details  Name: Lisa Novak MRN: [id number] Date of Birth: June 13, 1970 Referring Provider: Jovita Gamma, MD  Encounter Date: 10/07/2015      PT End of Session - 10/07/15 1241    Visit Number 14   Number of Visits 17   Date for PT Re-Evaluation 10/13/15   PT Start Time 9628   PT Stop Time 1238   PT Time Calculation (min) 50 min   Activity Tolerance Patient tolerated treatment well   Behavior During Therapy [facility] for tasks assessed/performed      Past Medical History  Diagnosis Date  . Hypertension   . GERD (gastroesophageal reflux disease)   . Umbilical hernia   . Diabetes type 2, controlled ([place])   . Diverticulitis   . SVD (spontaneous vaginal delivery)     x 3  . PONV (postoperative nausea and vomiting)     Past Surgical History  Procedure Laterality Date  . Abdominal hysterectomy    . Hernia repair    . Abdominoplasty    . Laparoscopic appendectomy N/A 02/11/2014    Procedure: APPENDECTOMY LAPAROSCOPIC;  Surgeon: Autumn Messing III, MD;  Location: [facility];  Service: General;  Laterality: N/A;  . Appendectomy    . Wisdom tooth extraction    . Robotic assisted laparoscopic ovarian cystectomy Left 04/13/2014    Procedure: ROBOTIC ASSISTED LAPAROSCOPIC Left OVARIAN CYSTECTOMY, BILATERAL SALPINGECTOMY,lysis of adhesions;  Surgeon: Princess Bruins, MD;  Location: [facility];  Service: Gynecology;  Laterality: Left;    There were no vitals filed for this visit.      Subjective Assessment - 10/07/15 1105    Subjective "the pain is more intense today"   Currently in Pain? Yes   Pain Score 4    Pain Location Hip   Pain Orientation Right   Pain Descriptors / Indicators Aching   Pain Onset More than a month ago   Pain Frequency Constant                          [facility] Adult PT Treatment/Exercise - 10/07/15 0001    Self-Care   Other Self-Care Comments  radiating pain from glute medius and how it can recreate pain she is feeling   Lumbar Exercises: Stretches   Active Hamstring Stretch 5 reps;30 seconds  contract/ relax with 10 sec hold   ITB Stretch 3 reps;10 seconds  in L sidelying   Knee/Hip Exercises: Standing   Hip Abduction AROM;Stengthening;Both;2 sets;10 reps;Knee straight   Hip Extension AROM;Stengthening;Both;2 sets;10 reps;Knee straight   Cryotherapy   Number Minutes Cryotherapy 10 Minutes   Cryotherapy Location Hip  r lateral hip in L sidelying   Type of Cryotherapy Ice pack   Manual Therapy   Soft tissue mobilization IASTM of R glute medius and minimus   Muscle Energy Technique supine R resisted hip flexion, 4 x 20 sec          Trigger Point Dry Needling - 10/07/15 1237    Consent Given? Yes   Education Handout Provided Yes  given previously   Muscles Treated Lower Body Gluteus minimus  medius on R   Gluteus Minimus Response Twitch response elicited;Palpable increased muscle length  Medius on R              PT Education - 10/07/15 1241  Education provided Yes   Education Details referral of pain from glute medius and education regarding bursas in the hip   Person(s) Educated Patient   Methods Explanation   Comprehension Verbalized understanding          PT Short Term Goals - 09/22/15 1223    PT SHORT TERM GOAL #1   Title be independent in initial HEP   Time 4   Period Weeks   Status Achieved   PT SHORT TERM GOAL #2   Title report a 30% reduction in LBP with home and work tasks   Time 4   Period Weeks   Status Achieved   PT SHORT TERM GOAL #3   Title tolerate standing for 20 minutes at home and in the community    Time 4   Period Weeks   Status Achieved   PT SHORT TERM GOAL #4   Title sit for 30 minutes at work without increased pain   Time 4   Period Weeks   Status Achieved            PT Long Term Goals - 10/07/15 1244    PT LONG TERM GOAL #1   Title be independent in advanced HEP   Time 8   Period Weeks   Status On-going   PT LONG TERM GOAL #2   Title reduce FOTO to < or = to 43% limitation   Time 8   Period Weeks   Status Achieved   PT LONG TERM GOAL #3   Title report a 60% reduction in LBP with home and work tasks   Time 8   Period Weeks   Status Achieved   PT LONG TERM GOAL #4   Title tolerate standing/walking for 1 hour in the community without limitation   Time 8   Period Weeks   Status Achieved   PT LONG TERM GOAL #5   Title sit at work for 1 hour with 1-2 mini breaks without increased LBP   Time 8   Period Weeks   Status Achieved   PT LONG TERM GOAL #6   Title pt will improve bil hip extension/ abduction strength to >/= 4/5 with </= 1/10 to assist with prolonged walking and standing activities    Time 8   Status On-going               Plan - 10/07/15 1242    Clinical Impression Statement Lisa Novak repored increaesd pain inthe hip rated at 4/10. Dn was performed on the R glute medius; pt was monitored during treatment. follwing DN IASTM and stretching of the glute med was done and she rpeorted relief of pain to 2/10. she conitnued to exhibit posterior hip rotation so peform MET of R hip flexors and hamstring stretching. post session she rpeorted pain to a 1/10 following ice.    PT Next Visit Plan core stab (quadruped, prone, multifidus) and assess IFC, repeat if benefit, Posterior rotated innominate on R Re-assess, assess response ot DN   PT Home Exercise Plan standing hip abduction/ extension.    Consulted and Agree with Plan of Care Patient      Patient will benefit from skilled therapeutic intervention in order to improve the following deficits and impairments:  Pain, Increased muscle spasms, Decreased activity tolerance  Visit Diagnosis: Cramp and spasm  Right-sided low back pain with right-sided sciatica  Muscle spasm of  back     Problem List Patient Active Problem List   Diagnosis Date Noted  .  Ovarian cyst 04/13/2014  . Appendicitis, acute 02/11/2014   Starr Lake PT, DPT, LAT, ATC  10/07/2015  12:45 PM      [facility] [address] [place], [place], [address] Phone: [phone]   Fax:  [phone]  Name: Lisa Novak MRN: [id number] Date of Birth: 06-30-1970   PHYSICAL THERAPY DISCHARGE SUMMARY  Visits from Start of Care: 14  Current functional level related to goals / functional outcomes: See goals   Remaining deficits: unknown   Education / Equipment: HEP  Plan:                                                    Patient goals were partially met. Patient is being discharged due to not returning since the last visit.  ?????     Jinnifer Montejano PT, DPT, LAT, ATC  01/09/16  10:00 AM

## 2015-11-01 ENCOUNTER — Telehealth: Payer: Self-pay | Admitting: Physical Therapy

## 2015-11-01 NOTE — Telephone Encounter (Signed)
LVM seeing how pt was doing and that she hasn't been seen since 10/07/2015. If she was needing to continue with physical therapy to call back and we can re-evaluate and get her scheduled, or if she is doing well we can discharge at this time.

## 2015-12-23 ENCOUNTER — Emergency Department (HOSPITAL_COMMUNITY)
Admission: EM | Admit: 2015-12-23 | Discharge: 2015-12-23 | Disposition: A | Discharge status: Home / Self Care | Payer: No Typology Code available for payment source | Attending: Emergency Medicine | Admitting: Emergency Medicine

## 2015-12-23 ENCOUNTER — Encounter (HOSPITAL_COMMUNITY): Payer: Self-pay | Admitting: Emergency Medicine

## 2015-12-23 DIAGNOSIS — Z791 Long term (current) use of non-steroidal anti-inflammatories (NSAID): Secondary | ICD-10-CM | POA: Insufficient documentation

## 2015-12-23 DIAGNOSIS — E119 Type 2 diabetes mellitus without complications: Secondary | ICD-10-CM | POA: Diagnosis not present

## 2015-12-23 DIAGNOSIS — M5441 Lumbago with sciatica, right side: Secondary | ICD-10-CM | POA: Insufficient documentation

## 2015-12-23 DIAGNOSIS — M545 Low back pain: Secondary | ICD-10-CM | POA: Diagnosis present

## 2015-12-23 DIAGNOSIS — I10 Essential (primary) hypertension: Secondary | ICD-10-CM | POA: Insufficient documentation

## 2015-12-23 MED ORDER — KETOROLAC TROMETHAMINE 60 MG/2ML IM SOLN
60.0000 mg | Freq: Once | INTRAMUSCULAR | Status: AC
  Administered 2015-12-23: 60 mg via INTRAMUSCULAR
  Filled 2015-12-23: qty 2

## 2015-12-23 MED ORDER — NAPROXEN 500 MG PO TABS
500.0000 mg | ORAL_TABLET | Freq: Two times a day (BID) | ORAL | 0 refills | Status: DC
Start: 2015-12-23 — End: 2019-01-27

## 2015-12-23 MED ORDER — METHOCARBAMOL 500 MG PO TABS: 500.0000 mg | ORAL_TABLET | Freq: Two times a day (BID) | ORAL | 0 refills | Status: DC

## 2015-12-23 MED ORDER — TRAMADOL HCL 50 MG PO TABS: 50.0000 mg | ORAL_TABLET | Freq: Four times a day (QID) | ORAL | 0 refills | Status: DC | PRN

## 2015-12-23 MED ORDER — DEXAMETHASONE SODIUM PHOSPHATE 10 MG/ML IJ SOLN
10.0000 mg | Freq: Once | INTRAMUSCULAR | Status: AC
  Administered 2015-12-23: 10 mg via INTRAMUSCULAR
  Filled 2015-12-23: qty 1

## 2015-12-23 NOTE — ED Notes (Signed)
PA at bedside.

## 2015-12-23 NOTE — ED Provider Notes (Signed)
[facility] DEPT Provider Note   CSN: [id number] Arrival date & time: 12/23/15  [id number]  First Provider Contact:   First MD Initiated Contact with Patient 12/23/15 2040        By signing my name below, I, Hansel Feinstein, attest that this documentation has been prepared under the direction and in the presence of  Locklyn Henriquez, PA-C. Electronically Signed: Hansel Feinstein, ED Scribe. 12/23/15. 8:55 PM.    History   Chief Complaint Chief Complaint  Patient presents with  . Back Pain    HPI Lisa Novak is a 45 y.o. female who presents to the Emergency Department complaining of moderate, acute on chronic, aching right lower back pain and swelling with radiation to the right hip onset tonight. Pt states her pain originated a year ago after falling. She denies recent re-injury, trauma or falls. Pt is ambulatory with minimal difficulty. She also complains of intermittent paresthesias to the right leg. Pt states that pain is worsened with movement, ambulation and in certain positions. Pt denies taking OTC medications at home to improve symptoms. Pt reports that she took Hydrocodone last week that provided some relief of her pain. She reports that her current pain is consistent with prior pain flare-ups, but more severe. She states she has previously been treated with prednisone and muscle relaxants with no relief of pain. She is currently followed by Dr. Sherwood Gambler and has an upcoming appointment. Pt states she underwent 9 weeks of physical therapy and reports that this did not alleviate her ongoing pain. No h/o IDVU, HIV, cancer, spinal injections. She denies bowel or bladder incontinence, saddle anesthesia, fever, cough, abdominal pain, nausea, emesis, dysuria, hematuria, frequency, rash. She also denies numbness or focal weakness of the lower extremities.     Per record review, she had an MRI on 07/31/15 showing:    IMPRESSION: 1. Facet arthropathy at L3-4 and below, progressed compared to  2015 abdominal CT. Notable periarticular inflammation on the right at L4-5. 2. Mild lumbar levoconvexity and disc degeneration as described. 3. No compressive stenosis.   The history is provided by the patient and medical records. No language interpreter was used.    Past Medical History:  Diagnosis Date  . Diabetes type 2, controlled ([place])   . Diverticulitis   . GERD (gastroesophageal reflux disease)   . Hypertension   . PONV (postoperative nausea and vomiting)   . SVD (spontaneous vaginal delivery)    x 3  . Umbilical hernia     Patient Active Problem List   Diagnosis Date Noted  . Ovarian cyst 04/13/2014  . Appendicitis, acute 02/11/2014    Past Surgical History:  Procedure Laterality Date  . ABDOMINAL HYSTERECTOMY    . ABDOMINOPLASTY    . APPENDECTOMY    . HERNIA REPAIR    . LAPAROSCOPIC APPENDECTOMY N/A 02/11/2014   Procedure: APPENDECTOMY LAPAROSCOPIC;  Surgeon: Autumn Messing III, MD;  Location: [facility];  Service: General;  Laterality: N/A;  . ROBOTIC ASSISTED LAPAROSCOPIC OVARIAN CYSTECTOMY Left 04/13/2014   Procedure: ROBOTIC ASSISTED LAPAROSCOPIC Left OVARIAN CYSTECTOMY, BILATERAL SALPINGECTOMY,lysis of adhesions;  Surgeon: Princess Bruins, MD;  Location: [facility];  Service: Gynecology;  Laterality: Left;  . WISDOM TOOTH EXTRACTION      OB History    No data available       Home Medications    Prior to Admission medications   Medication Sig Start Date End Date Taking? Authorizing Provider  calcium carbonate (TUMS - DOSED IN MG ELEMENTAL CALCIUM) 500 MG chewable tablet  Chew 2 tablets by mouth daily as needed for indigestion or heartburn. Reported on 08/17/2015    Historical Provider, MD  CAMILA 0.35 MG tablet Take 1 tablet by mouth daily. Reported on 08/17/2015 03/22/14   Historical Provider, MD  hydrocodone-ibuprofen (VICOPROFEN) 5-200 MG per tablet Take 1 tablet by mouth every 6 (six) hours as needed for pain. Patient not taking: Reported on 08/17/2015 04/13/14    Princess Bruins, MD  lansoprazole (PREVACID) 15 MG capsule Take 15 mg by mouth daily at 12 noon.    Historical Provider, MD  levofloxacin (LEVAQUIN) 500 MG tablet Take 1 tablet (500 mg total) by mouth daily. Patient not taking: Reported on 08/17/2015 09/17/14   Liam Graham, PA-C  losartan-hydrochlorothiazide (HYZAAR) 50-12.5 MG per tablet Take 1 tablet by mouth daily.    Historical Provider, MD  phentermine (ADIPEX-P) 37.5 MG tablet Take 18.75 mg by mouth daily before breakfast.    Historical Provider, MD  phentermine 37.5 MG capsule Take 37.5 mg by mouth every morning. Reported on 08/17/2015    Historical Provider, MD  promethazine (PHENERGAN) 25 MG tablet Take 1 tablet (25 mg total) by mouth every 6 (six) hours as needed for nausea or vomiting. Patient not taking: Reported on 08/17/2015 04/13/14   Princess Bruins, MD  ranitidine (ZANTAC) 150 MG tablet Take 150 mg by mouth daily. Reported on 08/17/2015    Historical Provider, MD    Family History No family history on file.  Social History Social History  Substance Use Topics  . Smoking status: Never Smoker  . Smokeless tobacco: Never Used  . Alcohol use No     Allergies   Review of patient's allergies indicates no known allergies.   Review of Systems Review of Systems  Constitutional: Negative for fever.  Respiratory: Negative for cough.   Gastrointestinal: Negative for abdominal pain, nausea and vomiting.       Negative for bowel incontinence.   Genitourinary: Negative for dysuria, frequency and hematuria.       Negative for bladder incontinence and saddle anesthesia.   Musculoskeletal: Positive for back pain.  Skin: Negative for rash.  Neurological: Negative for weakness and numbness.       +paresthesias.      Physical Exam Updated Vital Signs BP 148/100 (BP Location: Right Arm)   Pulse 103   Temp 97.9 F (36.6 C) (Oral)   Resp 16   Ht 5' 4.5" (1.638 m)   Wt 158 lb (71.7 kg)   SpO2 100%   BMI 26.70 kg/m    Physical Exam  Constitutional: She appears well-developed and well-nourished.  HENT:  Head: Normocephalic.  Eyes: Conjunctivae are normal.  Cardiovascular: Normal rate.   2+ DP pulses bilaterally.   Pulmonary/Chest: Effort normal. No respiratory distress.  Abdominal: She exhibits no distension.  Musculoskeletal: Normal range of motion. She exhibits tenderness. She exhibits no deformity.  TTP around the right SI joint.  No midline cervical, thoracic or lumbar spinal tenderness. No step-offs, crepitance or deformity.   Neurological: She is alert.  No saddle anesthesia.Normal gait. Distal sensation of both feet intact. 5/5 muscle strength of BLE.   Skin: Skin is warm and dry.  Psychiatric: She has a normal mood and affect. Her behavior is normal.  Nursing note and vitals reviewed.    ED Treatments / Results  Labs (all labs ordered are listed, but only abnormal results are displayed) Labs Reviewed - No data to display  EKG  EKG Interpretation None  Radiology No results found.  Procedures Procedures (including critical care time)  DIAGNOSTIC STUDIES: Oxygen Saturation is 100% on RA, normal by my interpretation.    COORDINATION OF CARE: 8:49 PM Discussed treatment plan with pt at bedside which includes IM decadron and Toradol, rx for muscle relaxer and tramadol and pt agreed to plan.    Medications Ordered in ED Medications - No data to display   Initial Impression / Assessment and Plan / ED Course  I have reviewed the triage vital signs and the nursing notes.  Pertinent labs & imaging results that were available during my care of the patient were reviewed by me and considered in my medical decision making (see chart for details).  Clinical Course    Patient with back pain.  No neurological deficits and normal neuro exam.  Patient is ambulatory.  No loss of bowel or bladder control.  No concern for cauda equina. No red flags. No fever, night sweats, weight  loss, h/o cancer, IVDA, no recent procedure to back. No urinary symptoms suggestive of UTI.  Supportive care and return precaution discussed. Appears safe for discharge at this time. Follow up as indicated in discharge paperwork.    Final Clinical Impressions(s) / ED Diagnoses   Final diagnoses:  None    New Prescriptions New Prescriptions   No medications on file   I personally performed the services described in this documentation, which was scribed in my presence. The recorded information has been reviewed and is accurate.    Hyman Bible, PA-C 12/23/15 2155    Tanna Furry, MD 01/04/16 [phone]

## 2015-12-23 NOTE — ED Triage Notes (Signed)
Pt c/o  Right lower back pain radiating down R leg, pt states pain began after fall 1 year ago, pt has had full work up including MRI, pt states pain has been worse lately with inability to sleep.

## 2016-01-12 ENCOUNTER — Other Ambulatory Visit: Payer: Self-pay | Admitting: Physician Assistant

## 2016-01-12 DIAGNOSIS — M533 Sacrococcygeal disorders, not elsewhere classified: Secondary | ICD-10-CM

## 2016-01-16 ENCOUNTER — Ambulatory Visit
Admission: RE | Admit: 2016-01-16 | Discharge: 2016-01-16 | Disposition: A | Discharge status: Home / Self Care | Payer: No Typology Code available for payment source | Source: Ambulatory Visit | Attending: Physician Assistant | Admitting: Physician Assistant

## 2016-01-16 DIAGNOSIS — M533 Sacrococcygeal disorders, not elsewhere classified: Secondary | ICD-10-CM

## 2016-01-16 IMAGING — MR MR SACRUM / SI JOINTS WO/W CM
5 of 9 series · 24 of 48 positions shown · IV contrast (multihance)
Comparison: CT scan of the abdomen and pelvis dated 02/11/2014 and
MRI dated 07/31/2015 and lumbar radiographs dated 08/16/2015

CLINICAL DATA: Right-sided low back pain and right buttock and hip
pain since a fall 1 year ago.

EXAM:
MRI SACRUM WITHOUT AND WITH CONTRAST
TECHNIQUE: Multiplanar multi echo sequences were performed before and after
contrast administration.
CONTRAST:  15mL MULTIHANCE GADOBENATE DIMEGLUMINE 529 MG/ML IV SOLN

[Series 3: T1 · sagittal · 4.0mm · 0.81mm/px · 8 of 30 slices shown (1 of 3)]
[im 1/30]
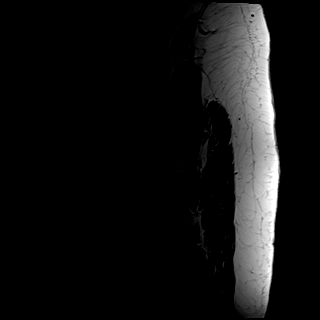
[im 5/30]
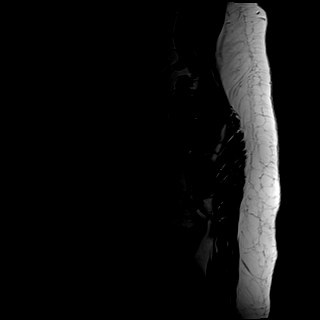
[im 9/30]
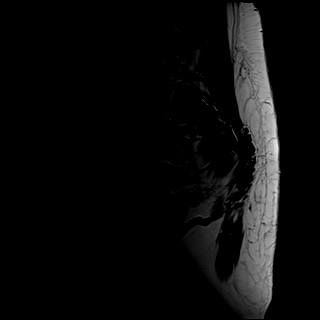
[im 13/30]
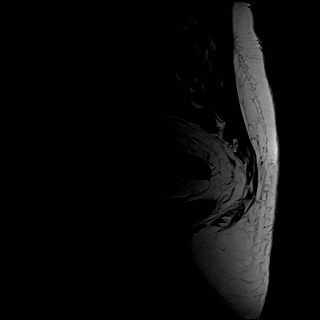
[im 17/30]
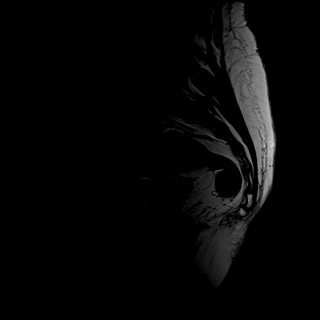
[im 21/30]
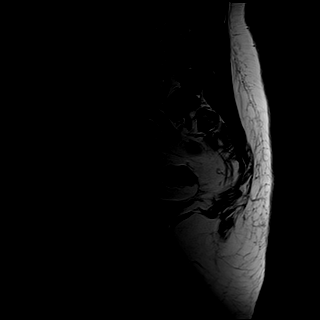
[im 25/30]
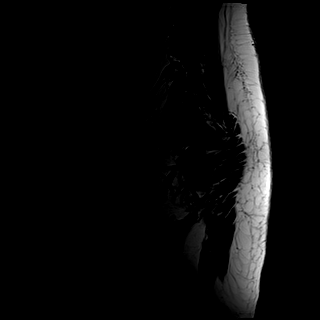
[im 30/30]
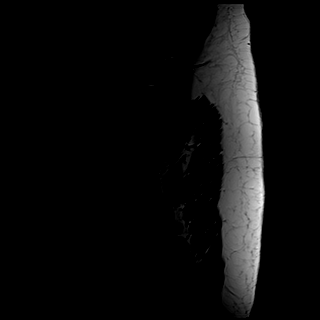

[Series 4: T1 · coronal · 4.0mm · 0.41mm/px · 4 of 15 slices shown (2 of 3)]
[im 1/15]
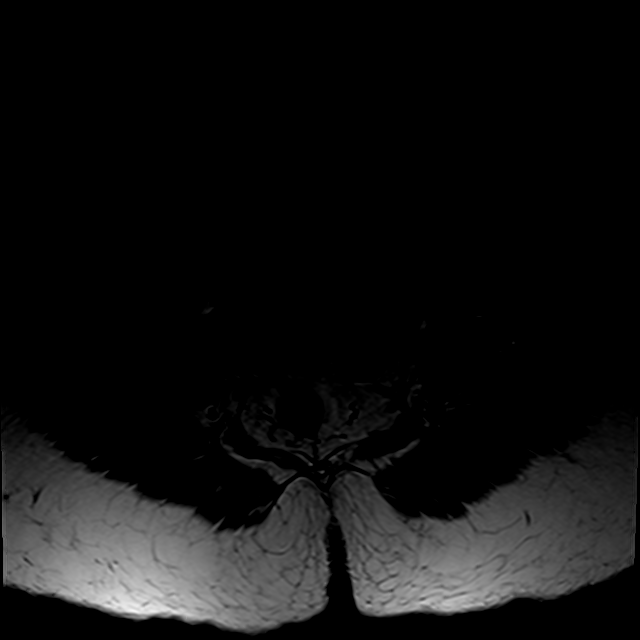
[im 5/15]
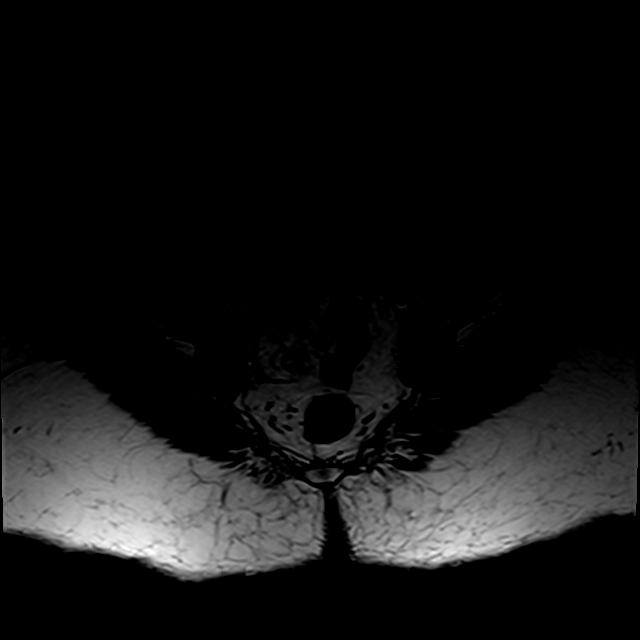
[im 10/15]
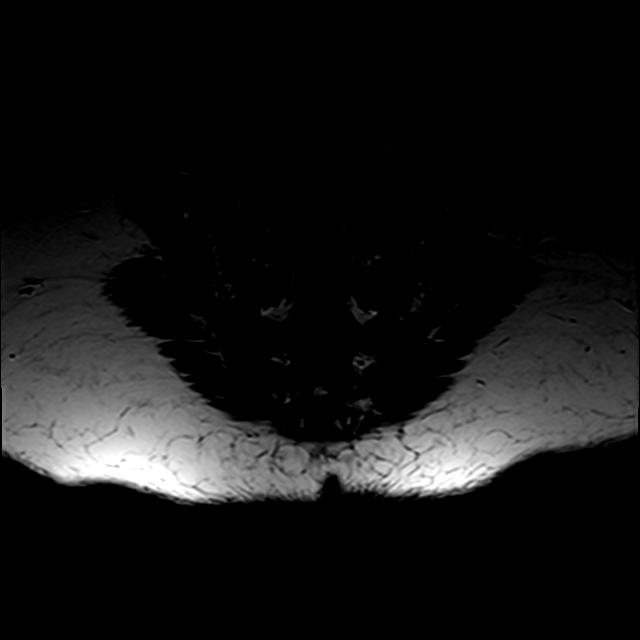
[im 15/15]
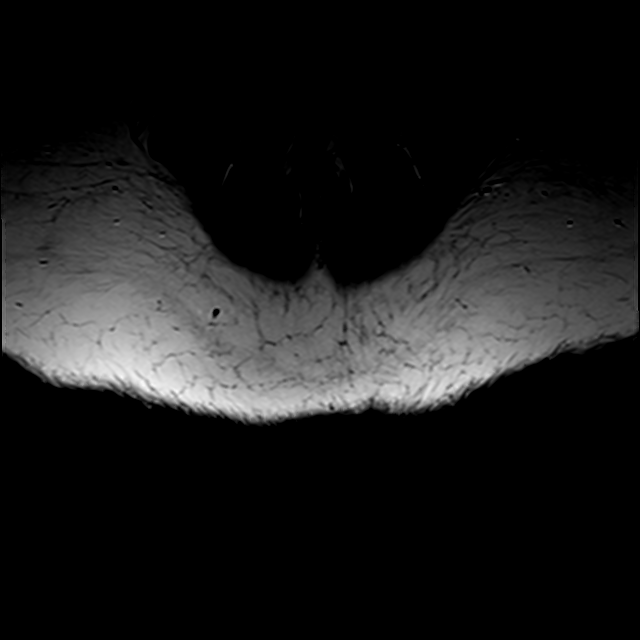

[Series 5: STIR · coronal · 4.0mm · 0.51mm/px · 4 of 15 slices shown (1 of 2)]
[im 1/15]
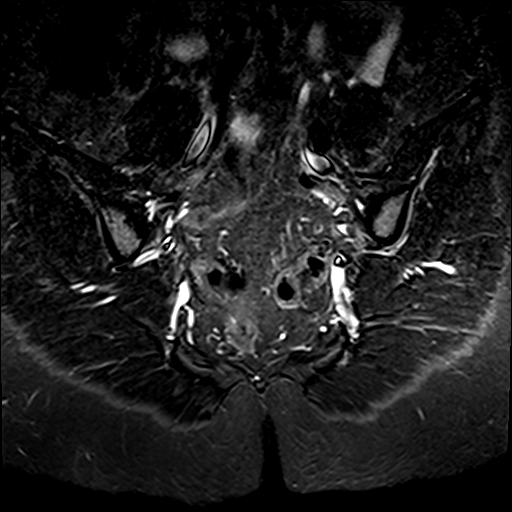
[im 5/15]
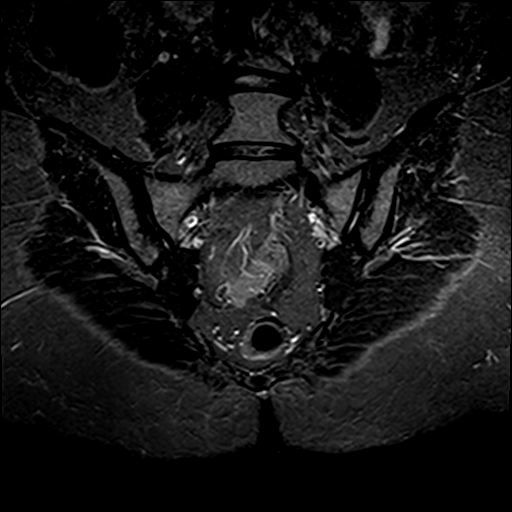
[im 10/15]
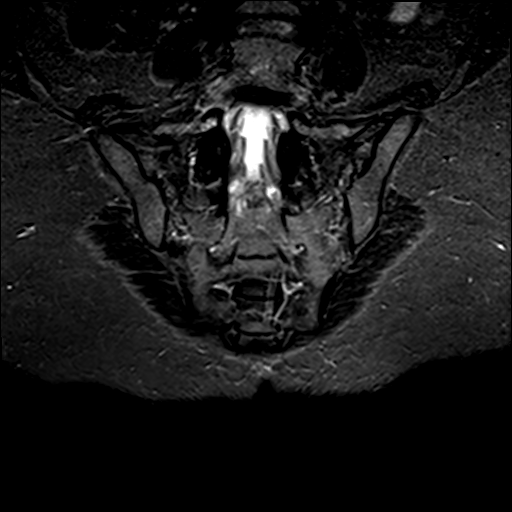
[im 15/15]
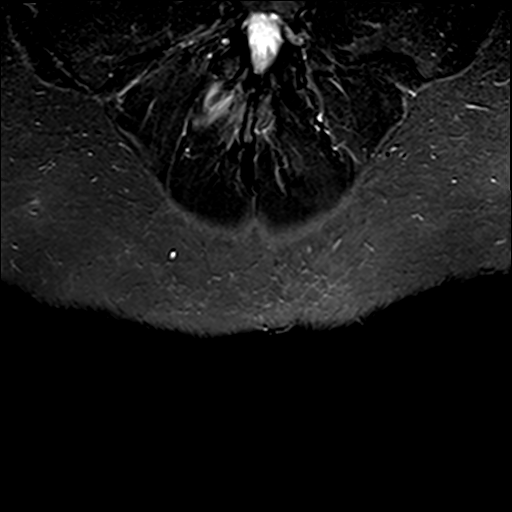

[Series 6: T1 · axial · 5.0mm · 0.47mm/px · z∈[-120,-12]mm · 5 of 20 slices shown (3 of 3)]
[im 1/20]
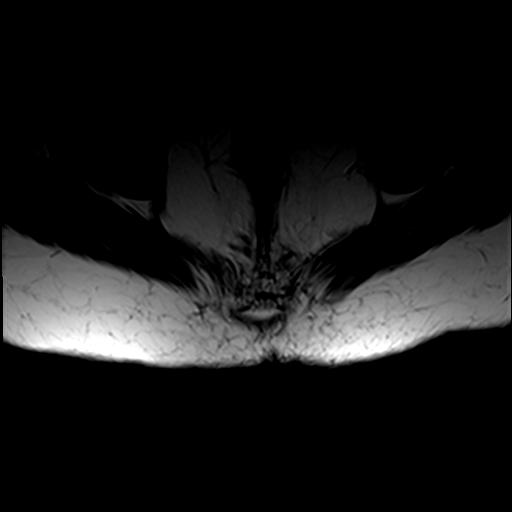
[im 5/20]
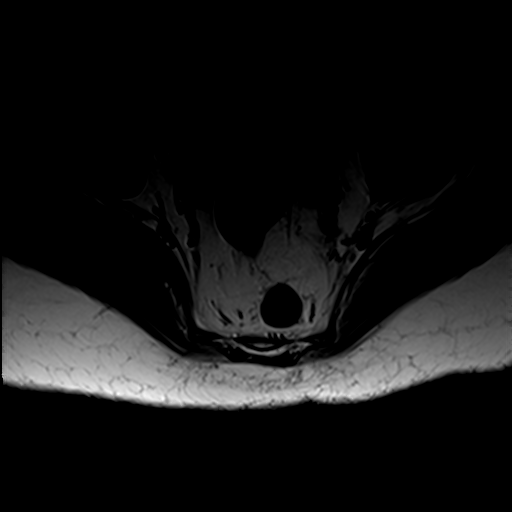
[im 10/20]
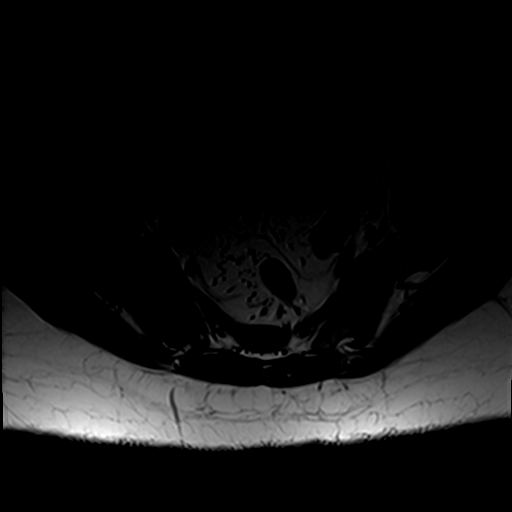
[im 15/20]
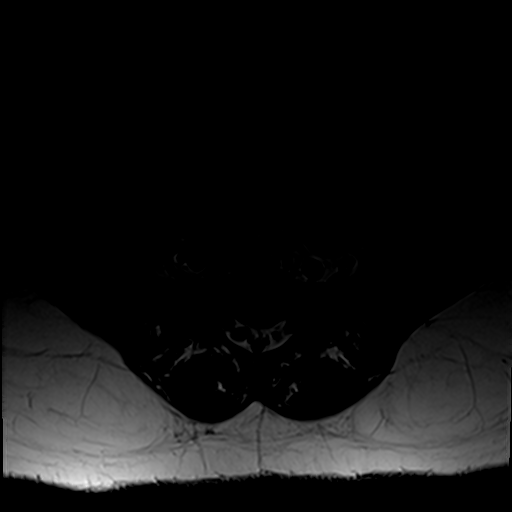
[im 20/20]
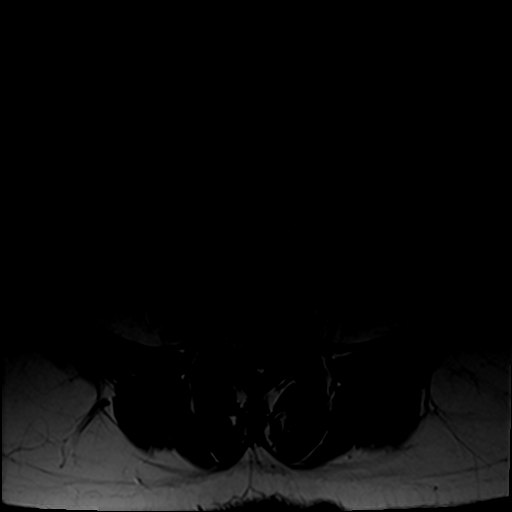

[Series 7: STIR · axial · 5.0mm · 0.94mm/px · z∈[-120,-69]mm · 3 of 20 slices shown (2 of 2)]
[im 1/20]
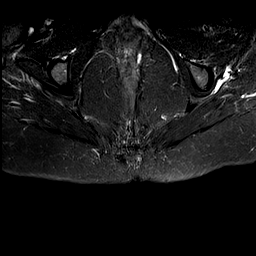
[im 5/20]
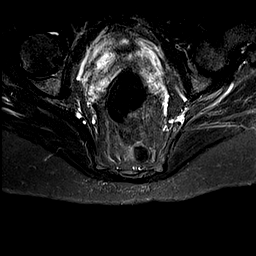
[im 10/20]
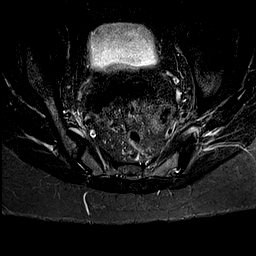

[24 of 48 positions shown; findings below may reference images not displayed]

FINDINGS: There is slight sclerosis of the iliac side of both sacroiliac
joints. However, this is unchanged since 7016. There is no
inflammation or erosion of the sacroiliac joints. No pathologic
enhancement of the sacrum or coccyx or SI joints after contrast
administration.

There is severe right facet arthritis at L4-5 with marked
inflammation in around the joint with prominent enhancement after
contrast administration. Abnormal enhancement extends into the right
pedicles of L4 and L5 and into the right neural foramen which I
suspect irritates the right L4 nerve although there is no discrete
impingement upon the nerve.

There is substantially less severe inflammation of the left facet
joint at L4-5.
IMPRESSION: 1. No significant abnormality of the sacrum, coccyx, or sacroiliac
joints.
2. Severe facet arthritis and facet joint inflammation at L4-5 on
the right with inflammation extending into the right neural foramen
which could irritate the right L4 nerve. No visible impingement upon
the L4 nerve.

## 2016-01-16 MED ORDER — GADOBENATE DIMEGLUMINE 529 MG/ML IV SOLN
15.0000 mL | Freq: Once | INTRAVENOUS | Status: AC | PRN
  Administered 2016-01-16: 15 mL via INTRAVENOUS

## 2016-06-06 ENCOUNTER — Ambulatory Visit
Admission: RE | Admit: 2016-06-06 | Discharge: 2016-06-06 | Disposition: A | Discharge status: Home / Self Care | Payer: No Typology Code available for payment source | Source: Ambulatory Visit | Attending: Pain Medicine | Admitting: Pain Medicine

## 2016-06-06 ENCOUNTER — Other Ambulatory Visit: Payer: Self-pay | Admitting: Pain Medicine

## 2016-06-06 DIAGNOSIS — M545 Low back pain: Principal | ICD-10-CM

## 2016-06-06 DIAGNOSIS — G8929 Other chronic pain: Secondary | ICD-10-CM

## 2016-06-06 DIAGNOSIS — R52 Pain, unspecified: Secondary | ICD-10-CM

## 2016-06-06 IMAGING — CR DG LUMBAR SPINE COMPLETE 4+V
4 series · 4 of 4 positions shown · non-contrast
Comparison: 08/16/2015 lumbar radiographs.

CLINICAL DATA: 45 y/o F; history of fall with lower back pain
intermittently radiating down the right leg.

EXAM:
LUMBAR SPINE - COMPLETE 4+ VIEW

[w lumbar spine ap]
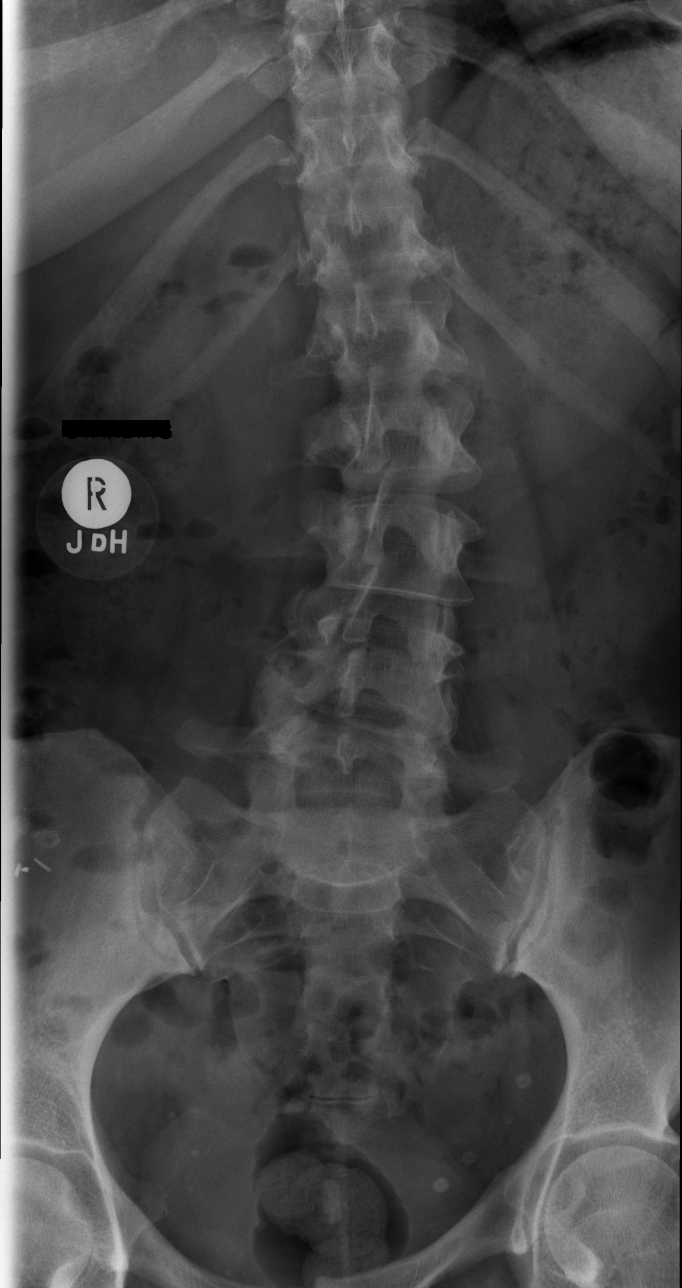

[w lumbar spine lat]
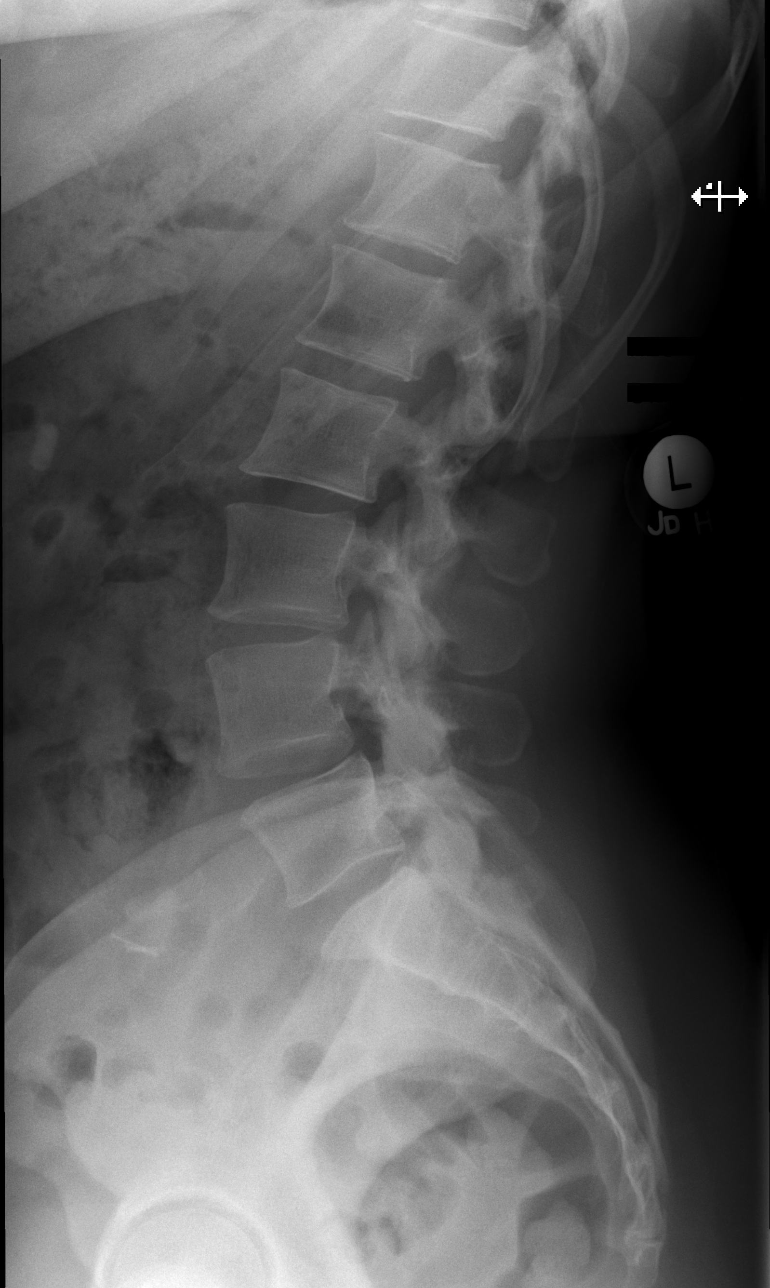

[w lumbar spine flexion]
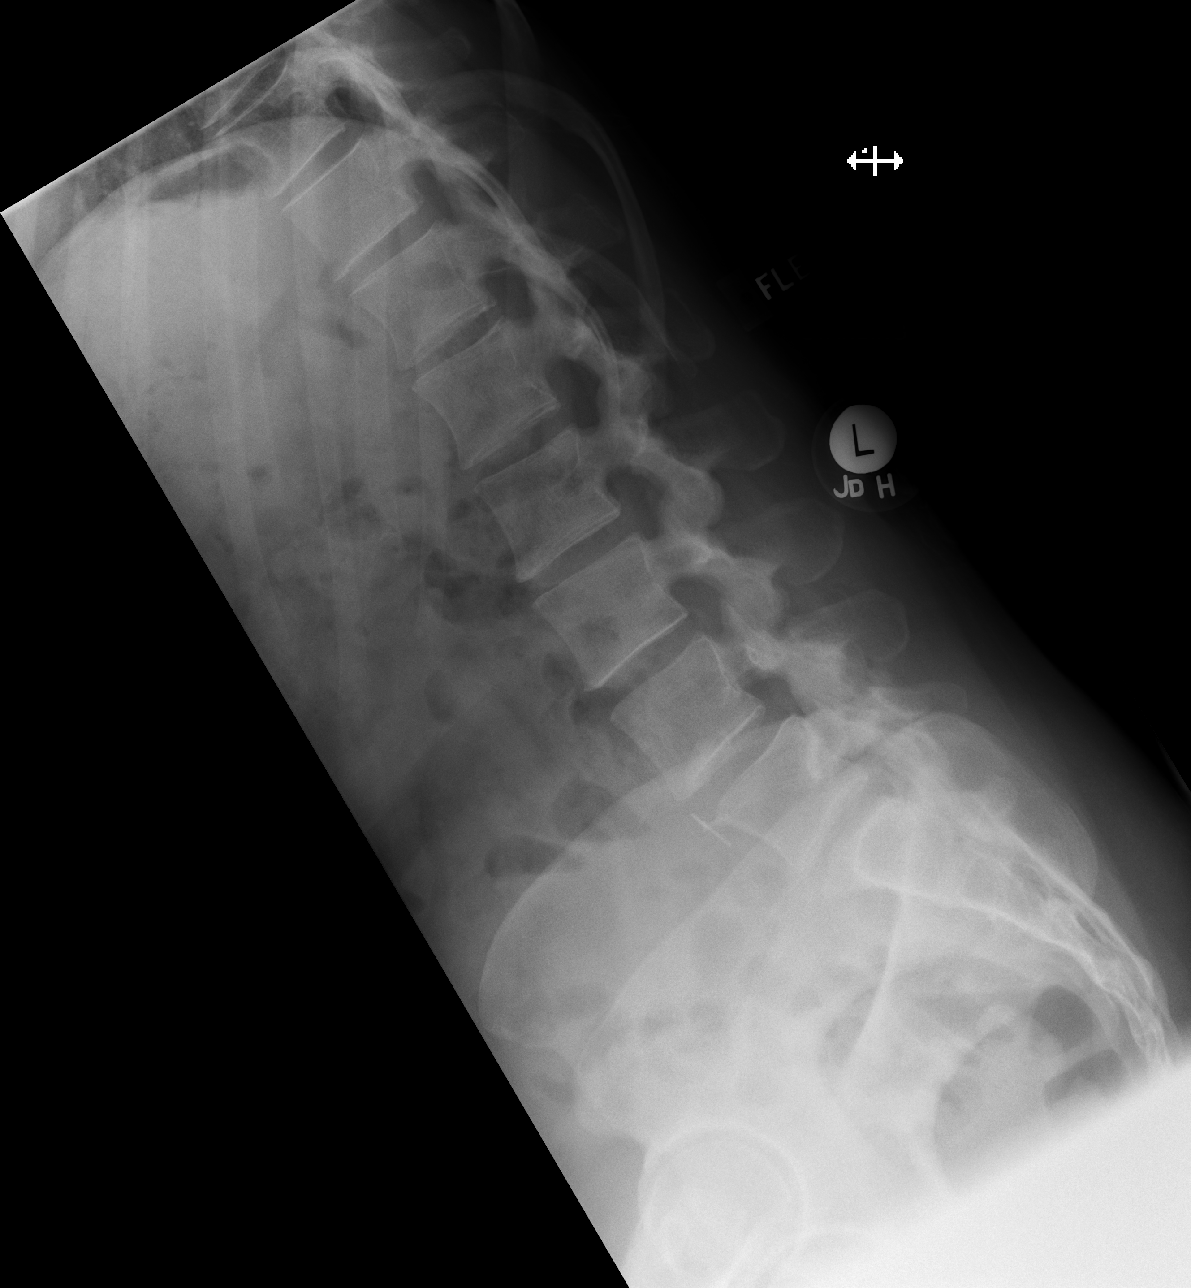

[w lumbar spine extension]
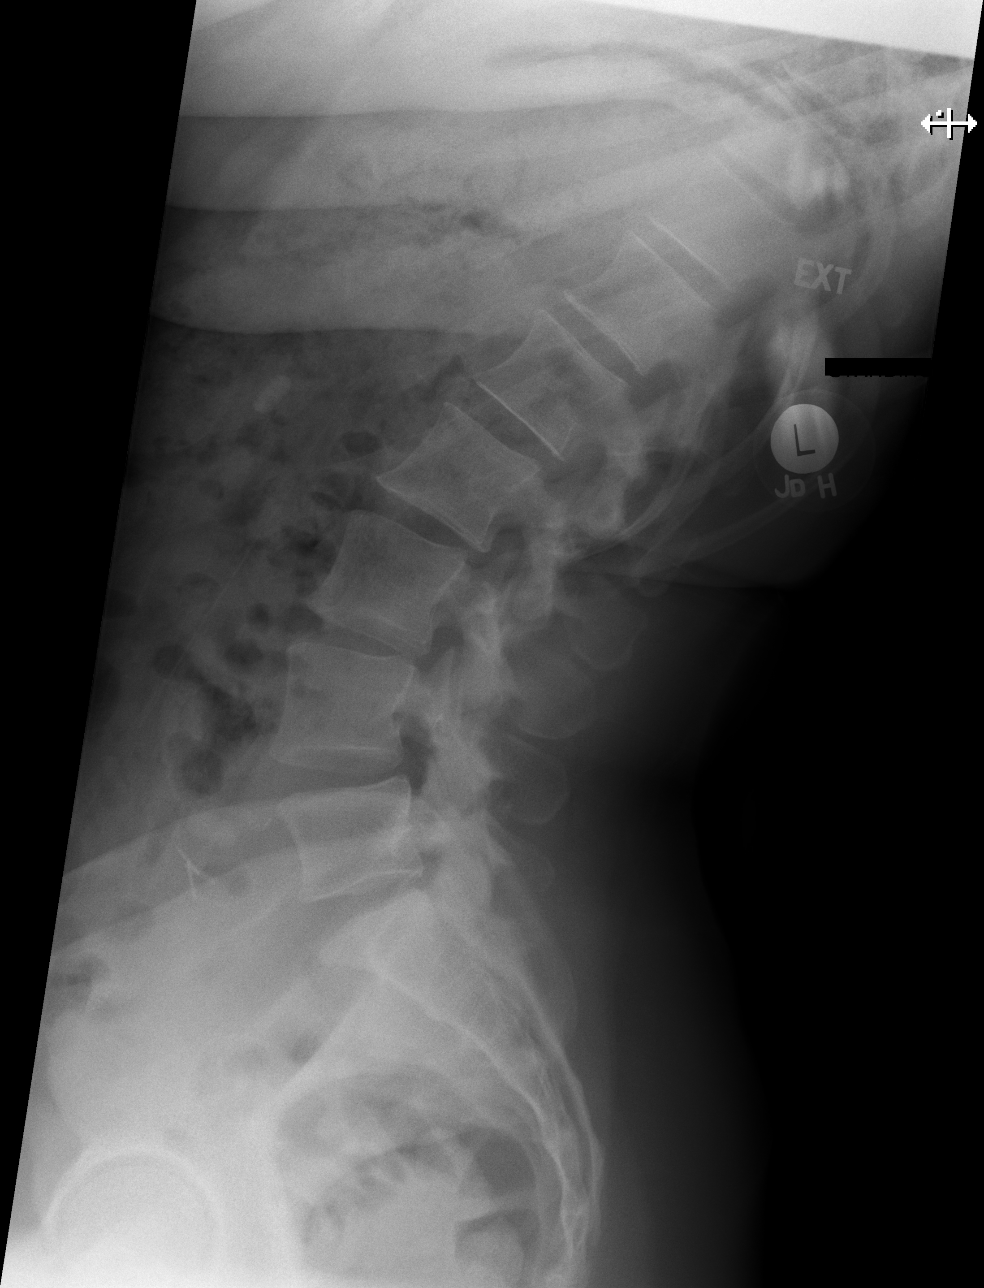

[4 of 4 positions shown; findings below may reference images not displayed]

FINDINGS: Five lumbar type non-rib-bearing vertebral bodies are present.
Moderate lumbar levocurvature with apex at L2. Lumbar lordosis is
maintained without listhesis. Vertebral body and disc space heights
are preserved. No acute fracture identified. Surgical clips project
over right hemipelvis.
IMPRESSION: No acute fracture or dislocation identified. Moderate lumbar
levocurvature.

By: Lydia Umana M.D.

## 2016-06-28 ENCOUNTER — Other Ambulatory Visit: Payer: Self-pay | Admitting: Obstetrics and Gynecology

## 2016-06-28 DIAGNOSIS — Z1231 Encounter for screening mammogram for malignant neoplasm of breast: Secondary | ICD-10-CM

## 2016-07-02 ENCOUNTER — Other Ambulatory Visit: Payer: Self-pay | Admitting: Orthopedic Surgery

## 2016-07-02 DIAGNOSIS — M545 Low back pain, unspecified: Secondary | ICD-10-CM

## 2016-07-02 DIAGNOSIS — G8929 Other chronic pain: Secondary | ICD-10-CM

## 2016-07-06 ENCOUNTER — Ambulatory Visit
Admission: RE | Admit: 2016-07-06 | Discharge: 2016-07-06 | Disposition: A | Discharge status: Home / Self Care | Payer: No Typology Code available for payment source | Source: Ambulatory Visit | Attending: Orthopedic Surgery | Admitting: Orthopedic Surgery

## 2016-07-06 DIAGNOSIS — G8929 Other chronic pain: Secondary | ICD-10-CM

## 2016-07-06 DIAGNOSIS — M545 Low back pain, unspecified: Secondary | ICD-10-CM

## 2016-07-06 IMAGING — CT CT L SPINE W/O CM
3 of 10 series · 9 of 27 positions shown, 10 images · non-contrast
Comparison: Lumbar spine radiographs June 06, 2016 and MRI of
the lumbar spine July 31, 2015

CLINICAL DATA: Low back pain, RIGHT sciatica for 1.5 years.
Preoperative evaluation.

EXAM:
CT LUMBAR SPINE WITHOUT CONTRAST
TECHNIQUE: Multidetector CT imaging of the lumbar spine was performed without
intravenous contrast administration. Multiplanar CT image
reconstructions were also generated.

[Series 4: l spine bone · axial · 0.27mm/px · z∈[-28,+42]mm · 2 of 84 slices shown, 3 images]
[im 28/84  soft-tissue]
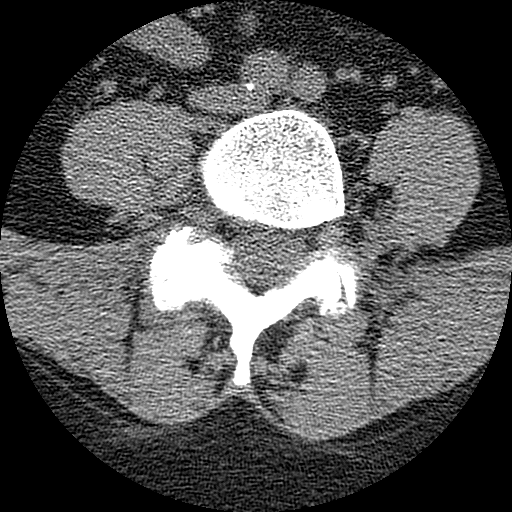
[im 28/84  bone]
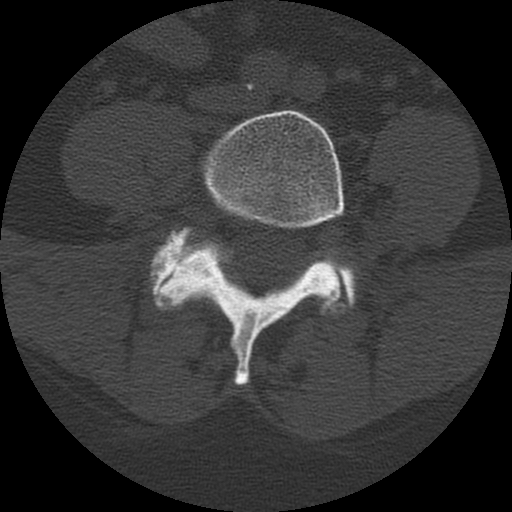
[im 56/84  bone]
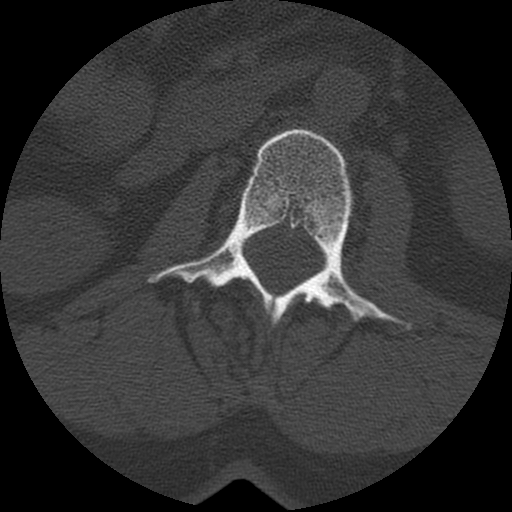

[Series 5: l spine detail · axial · 0.27mm/px · z∈[-28,+42]mm · 2 of 84 slices shown]
[im 28/84  bone]
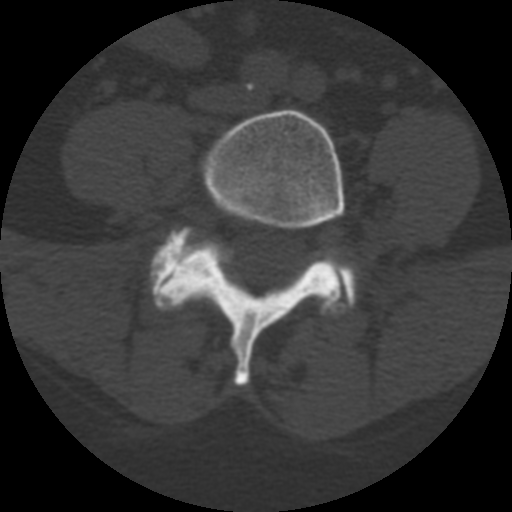
[im 56/84  bone]
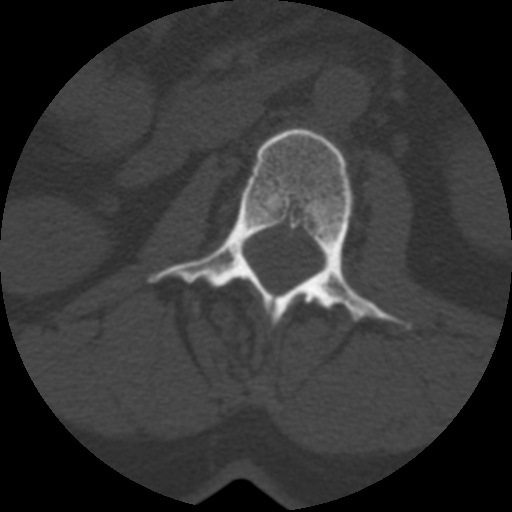

[Series 201: sagittal · sagittal · 0.42mm/px · 5 of 71 slices shown]
[im 12/71  bone]
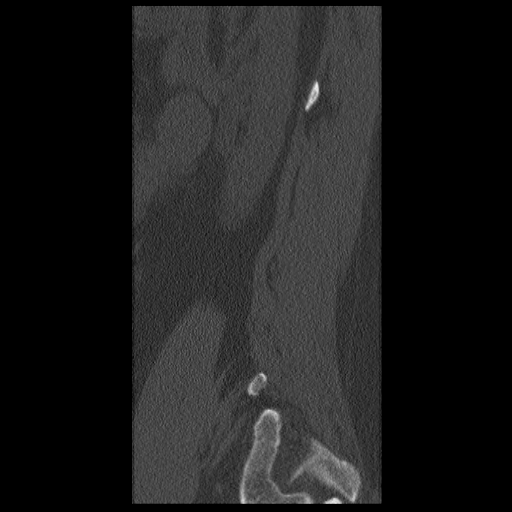
[im 24/71  bone]
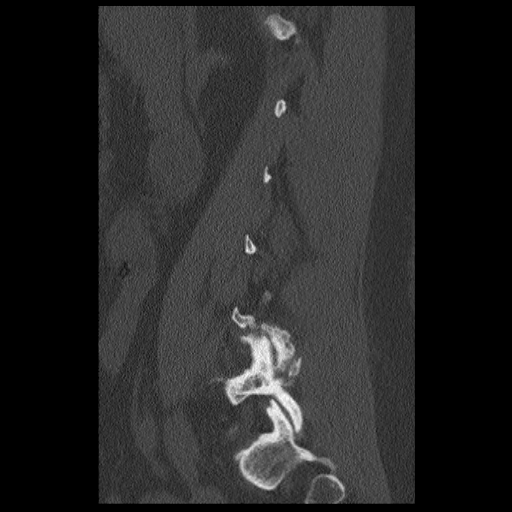
[im 36/71  bone]
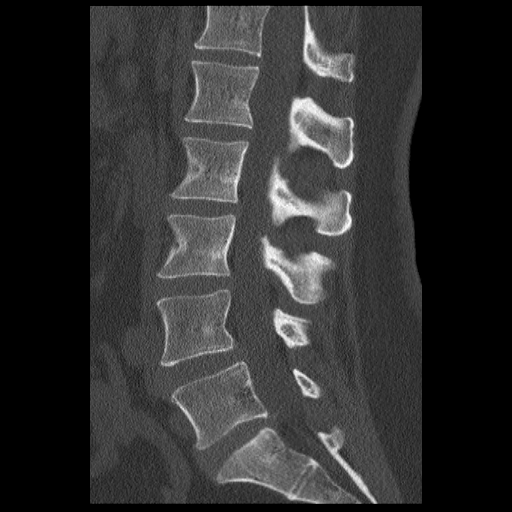
[im 47/71  bone]
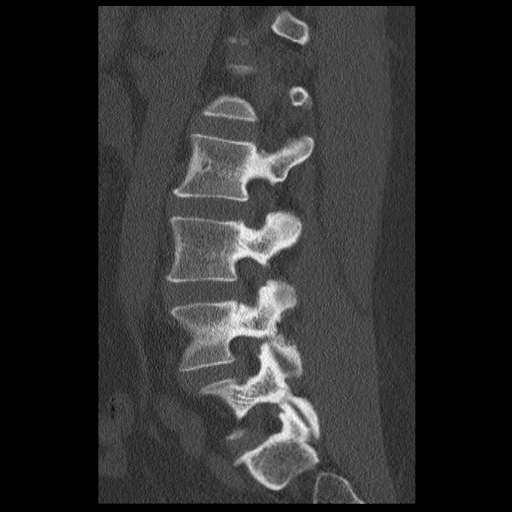
[im 59/71  bone]
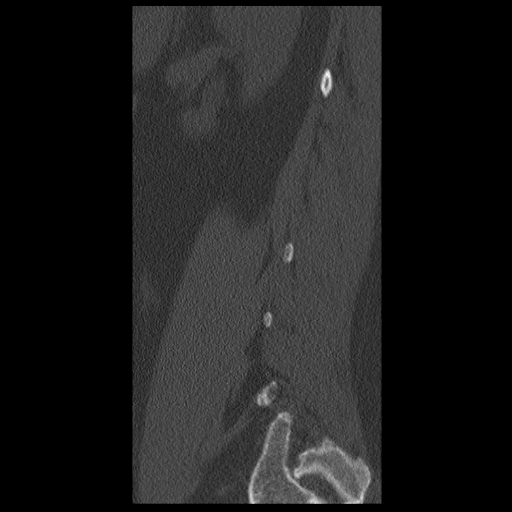

[9 of 27 positions shown; findings below may reference images not displayed]

FINDINGS: SEGMENTATION: For the purposes of this report the last well-formed
intervertebral disc space is reported as L5-S1, consistent with
prior imaging.

ALIGNMENT: Vertebral bodies in alignment, maintenance of the lumbar
lordosis. Broad levoscoliosis as seen on prior radiographs.

VERTEBRAE: Vertebral bodies and posterior elements are intact.
Intervertebral disc heights preserved. No destructive bony lesions.
Mild symmetric sacroiliac osteoarthrosis. Trace calcific
atherosclerosis of the included or aortoiliac vessels.

PARASPINAL AND OTHER SOFT TISSUES: Included prevertebral and
paraspinal soft tissues are unremarkable.

DISC LEVELS:

L1-2, L2-3: No disc bulge, canal stenosis nor neural foraminal
narrowing.

L3-4: No disc bulge. Mild facet arthropathy without canal stenosis
or neural foraminal narrowing.

L4-5: Small to moderate broad-based disc bulge. Severe RIGHT,
moderate LEFT facet arthropathy and ligamentum flavum redundancy. No
canal stenosis. Partial effacement of the lateral recess could
affect the traversing LEFT L5 nerve. Moderate bilateral neural
foraminal narrowing.

L5-S1: Small broad-based disc bulge. Mild facet arthropathy and
ligamentum flavum redundancy without canal stenosis. Mild LEFT
neural foraminal narrowing.
IMPRESSION: Degenerative lumbar spine without canal stenosis.

Moderate L4-5 and mild LEFT L5-S1 neural foraminal narrowing.

## 2016-07-24 ENCOUNTER — Ambulatory Visit
Admission: RE | Admit: 2016-07-24 | Discharge: 2016-07-24 | Disposition: A | Discharge status: Home / Self Care | Payer: No Typology Code available for payment source | Source: Ambulatory Visit | Attending: Obstetrics and Gynecology | Admitting: Obstetrics and Gynecology

## 2016-07-24 DIAGNOSIS — Z1231 Encounter for screening mammogram for malignant neoplasm of breast: Secondary | ICD-10-CM

## 2016-09-11 ENCOUNTER — Encounter (HOSPITAL_COMMUNITY): Payer: Self-pay | Admitting: Radiology

## 2016-09-11 ENCOUNTER — Other Ambulatory Visit: Payer: Self-pay

## 2016-09-11 ENCOUNTER — Emergency Department (HOSPITAL_COMMUNITY)
Admission: EM | Admit: 2016-09-11 | Discharge: 2016-09-11 | Disposition: A | Discharge status: Home / Self Care | Payer: No Typology Code available for payment source | Attending: Emergency Medicine | Admitting: Emergency Medicine

## 2016-09-11 ENCOUNTER — Emergency Department (HOSPITAL_COMMUNITY): Payer: No Typology Code available for payment source

## 2016-09-11 DIAGNOSIS — Z79899 Other long term (current) drug therapy: Secondary | ICD-10-CM | POA: Insufficient documentation

## 2016-09-11 DIAGNOSIS — I10 Essential (primary) hypertension: Secondary | ICD-10-CM | POA: Diagnosis not present

## 2016-09-11 DIAGNOSIS — R0602 Shortness of breath: Secondary | ICD-10-CM | POA: Diagnosis not present

## 2016-09-11 DIAGNOSIS — R0789 Other chest pain: Secondary | ICD-10-CM | POA: Insufficient documentation

## 2016-09-11 DIAGNOSIS — E119 Type 2 diabetes mellitus without complications: Secondary | ICD-10-CM | POA: Diagnosis not present

## 2016-09-11 LAB — BASIC METABOLIC PANEL
ANION GAP: 15 (ref 5–15)
BUN: 8 mg/dL (ref 6–20)
CALCIUM: 9.8 mg/dL (ref 8.9–10.3)
CHLORIDE: 97 mmol/L — AB (ref 101–111)
CO2: 24 mmol/L (ref 22–32)
Creatinine, Ser: 0.75 mg/dL (ref 0.44–1.00)
GFR calc non Af Amer: >60 mL/min (ref >60)
GLUCOSE: 134 mg/dL — AB (ref 65–99)
POTASSIUM: 4 mmol/L (ref 3.5–5.1)
Sodium: 136 mmol/L (ref 135–145)

## 2016-09-11 LAB — CBC
HEMATOCRIT: 36.7 % (ref 36.0–46.0)
Hemoglobin: 12.2 g/dL (ref 12.0–15.0)
MCH: 30 pg (ref 26.0–34.0)
MCHC: 33.2 g/dL (ref 30.0–36.0)
MCV: 90.2 fL (ref 78.0–100.0)
Platelets: 288 10*3/uL (ref 150–400)
RBC: 4.07 MIL/uL (ref 3.87–5.11)
RDW: 13.2 % (ref 11.5–15.5)
WBC: 8.8 10*3/uL (ref 4.0–10.5)

## 2016-09-11 LAB — I-STAT TROPONIN, ED
TROPONIN I, POC: 0 ng/mL (ref 0.00–0.08)
TROPONIN I, POC: 0 ng/mL (ref 0.00–0.08)

## 2016-09-11 LAB — D-DIMER, QUANTITATIVE: D-Dimer, Quant: 1.77 ug/mL-FEU — ABNORMAL HIGH (ref 0.00–0.50)

## 2016-09-11 IMAGING — CT CT ANGIO CHEST
3 of 7 series · 18 of 36 positions shown · IV contrast (Omni 300)
Comparison: Chest x-ray 09/11/2016

CLINICAL DATA: Lumbar fusion surgery on [REDACTED], shortness of
breath

EXAM:
CT ANGIOGRAPHY CHEST WITH CONTRAST
TECHNIQUE: Multidetector CT imaging of the chest was performed using the
standard protocol during bolus administration of intravenous
contrast. Multiplanar CT image reconstructions and MIPs were
obtained to evaluate the vascular anatomy.
CONTRAST:  80 mL Isovue 370 intravenous

[Series 6: pe thins · axial · 0.65mm/px · z∈[+1036,+1288]mm · 13 of 296 slices shown]
[im 22/296  lung]
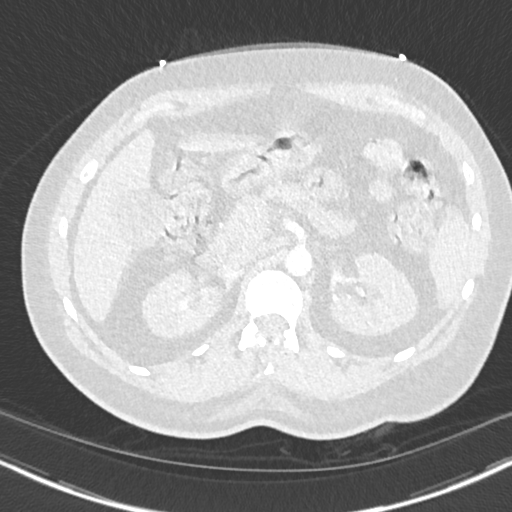
[im 43/296  mediastinal]
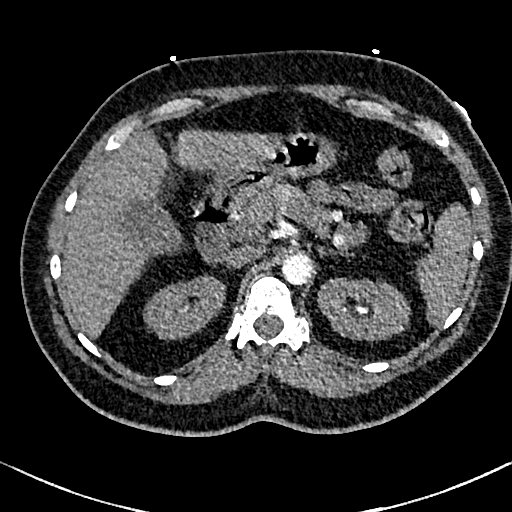
[im 64/296  lung]
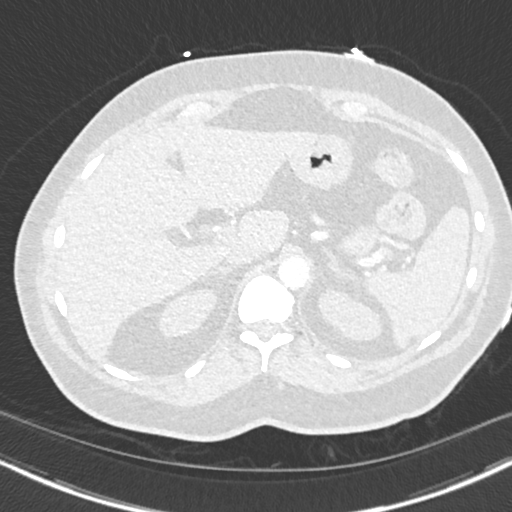
[im 85/296  mediastinal]
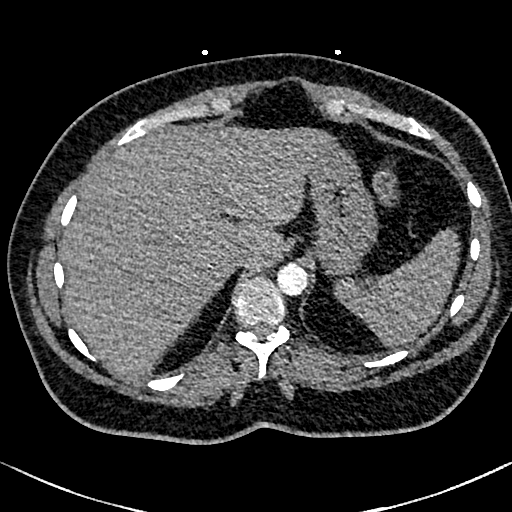
[im 106/296  lung]
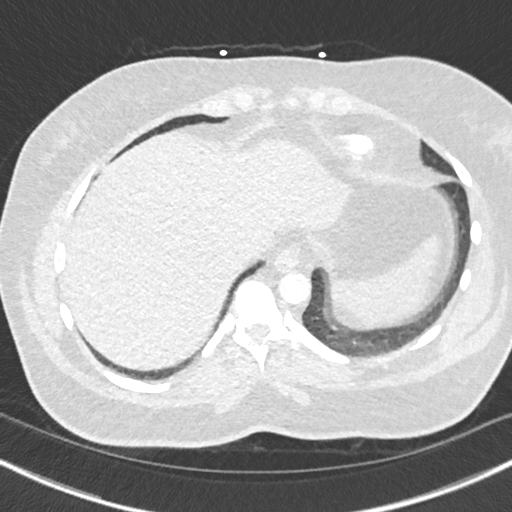
[im 127/296  mediastinal]
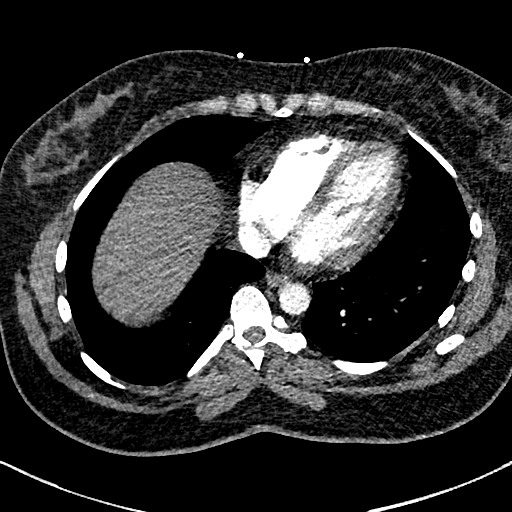
[im 148/296  lung]
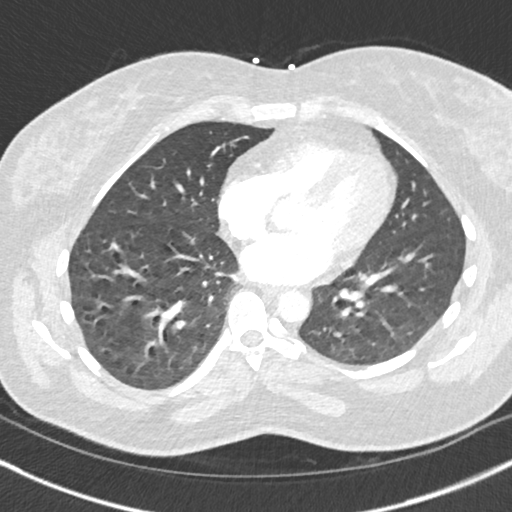
[im 169/296  mediastinal]
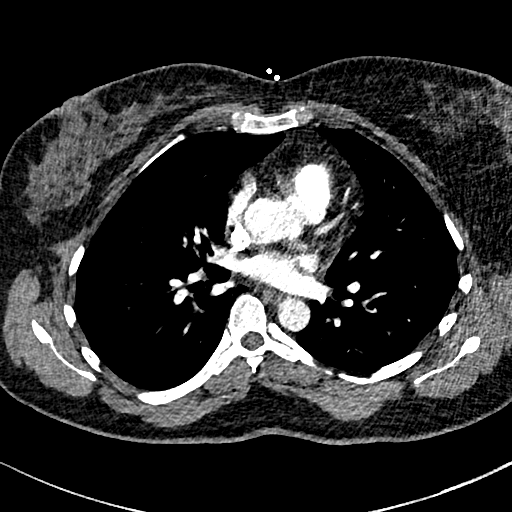
[im 190/296  lung]
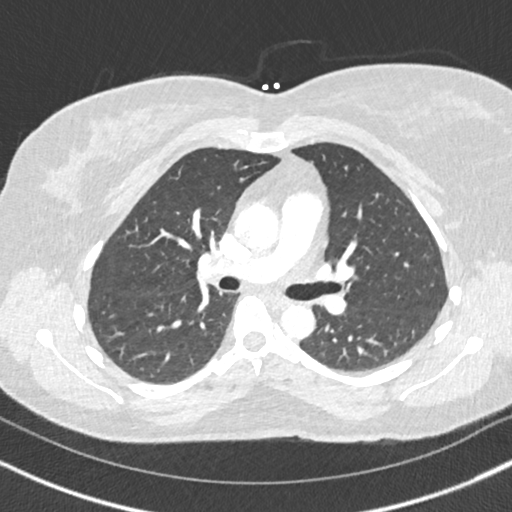
[im 211/296  mediastinal]
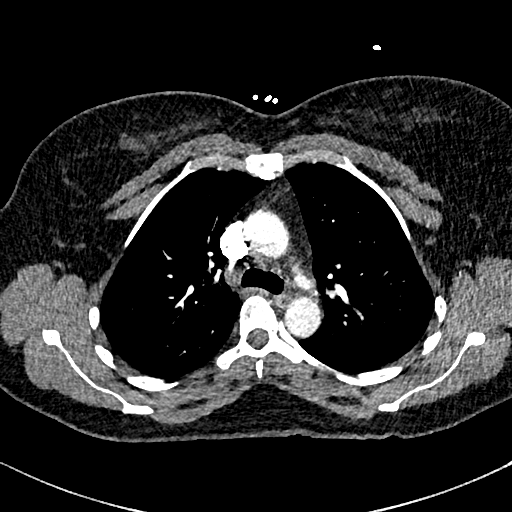
[im 232/296  lung]
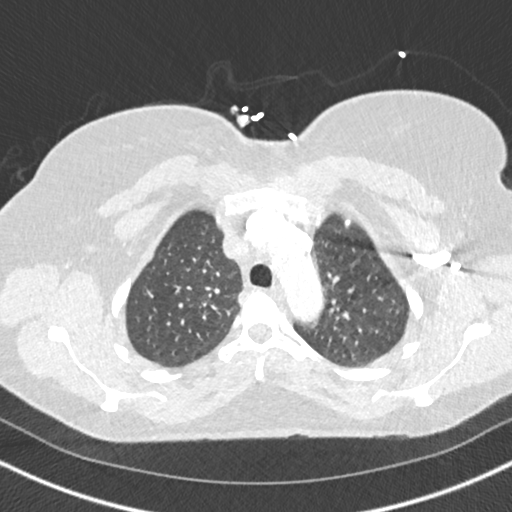
[im 253/296  mediastinal]
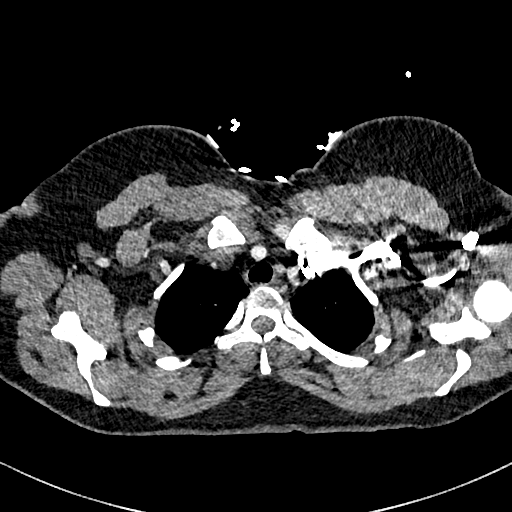
[im 274/296  lung]
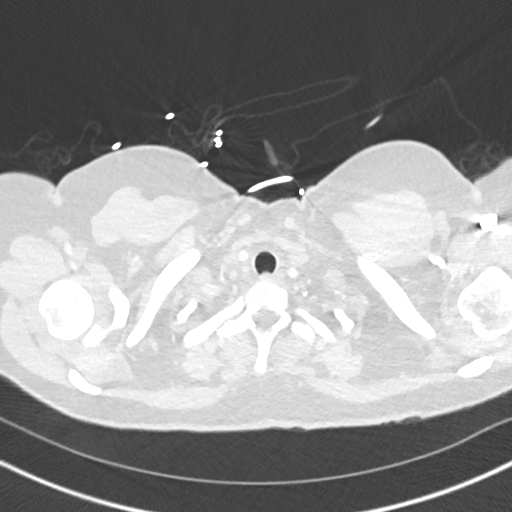

[Series 7: pe lung · axial · 0.65mm/px · z∈[+1134,+1266]mm · 4 of 110 slices shown]
[im 22/110  mediastinal]
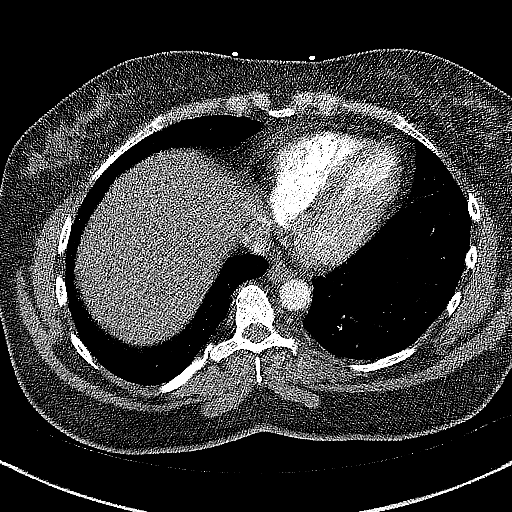
[im 44/110  mediastinal]
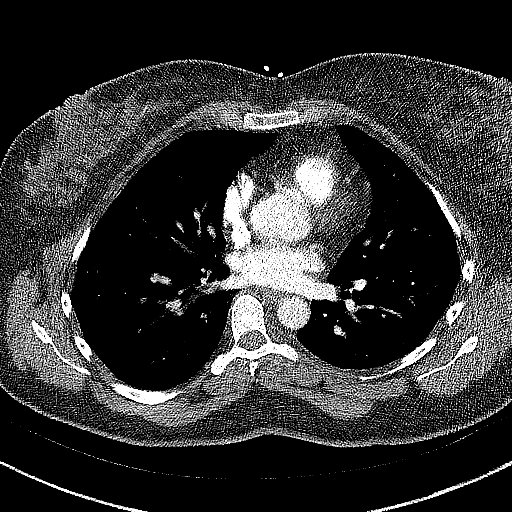
[im 66/110  mediastinal]
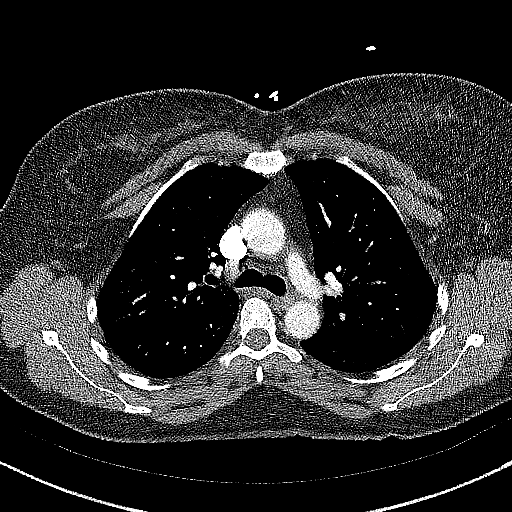
[im 88/110  mediastinal]
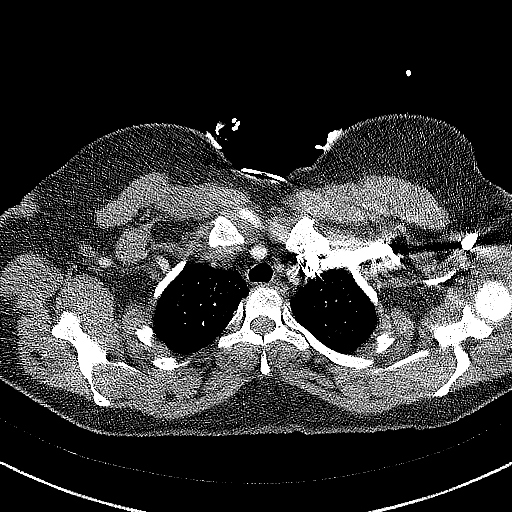

[Series 8: pe 2mm cor · coronal · 0.59mm/px · 1 of 125 slices shown]
[im 63/125  mediastinal]
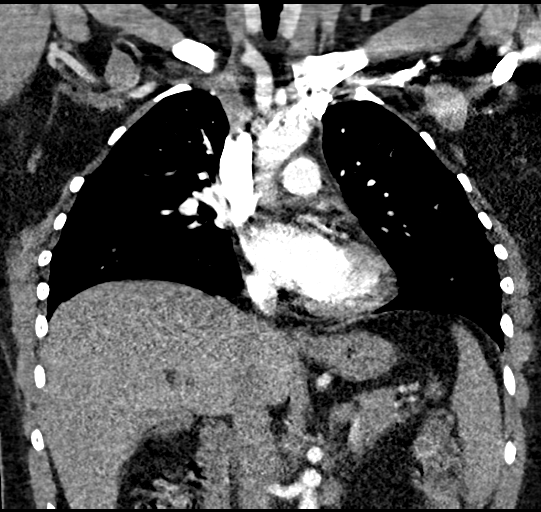

[18 of 36 positions shown; findings below may reference images not displayed]

FINDINGS: Cardiovascular: Suboptimal opacification of the distal most
pulmonary artery branches which are also partially obscured by
respiratory motion artifact. No gross filling defects within the
main or central segmental pulmonary arterial branches. Non
aneurysmal aorta. Normal heart size. No pericardial effusion.

Mediastinum/Nodes: No enlarged mediastinal, hilar, or axillary lymph
nodes. Thyroid gland, trachea, and esophagus demonstrate no
significant findings.

Lungs/Pleura: Lungs are clear. No pleural effusion or pneumothorax.

Upper Abdomen: No acute abnormality.

Musculoskeletal: No chest wall abnormality. No acute or significant
osseous findings.

Review of the MIP images confirms the above findings.
IMPRESSION: 1. Suboptimal opacification of distal most arterial branches which
are also obscured by motion artifact. No definite acute pulmonary
embolus is visualized.
2. Clear lung fields

## 2016-09-11 IMAGING — DX DG CHEST 2V
2 series · 2 of 2 positions shown · non-contrast
Comparison: Chest radiograph performed 06/19/2010

CLINICAL DATA: Acute onset of shortness of breath. Anxiety attack.
Initial encounter.

EXAM:
CHEST  2 VIEW

[chest pa]
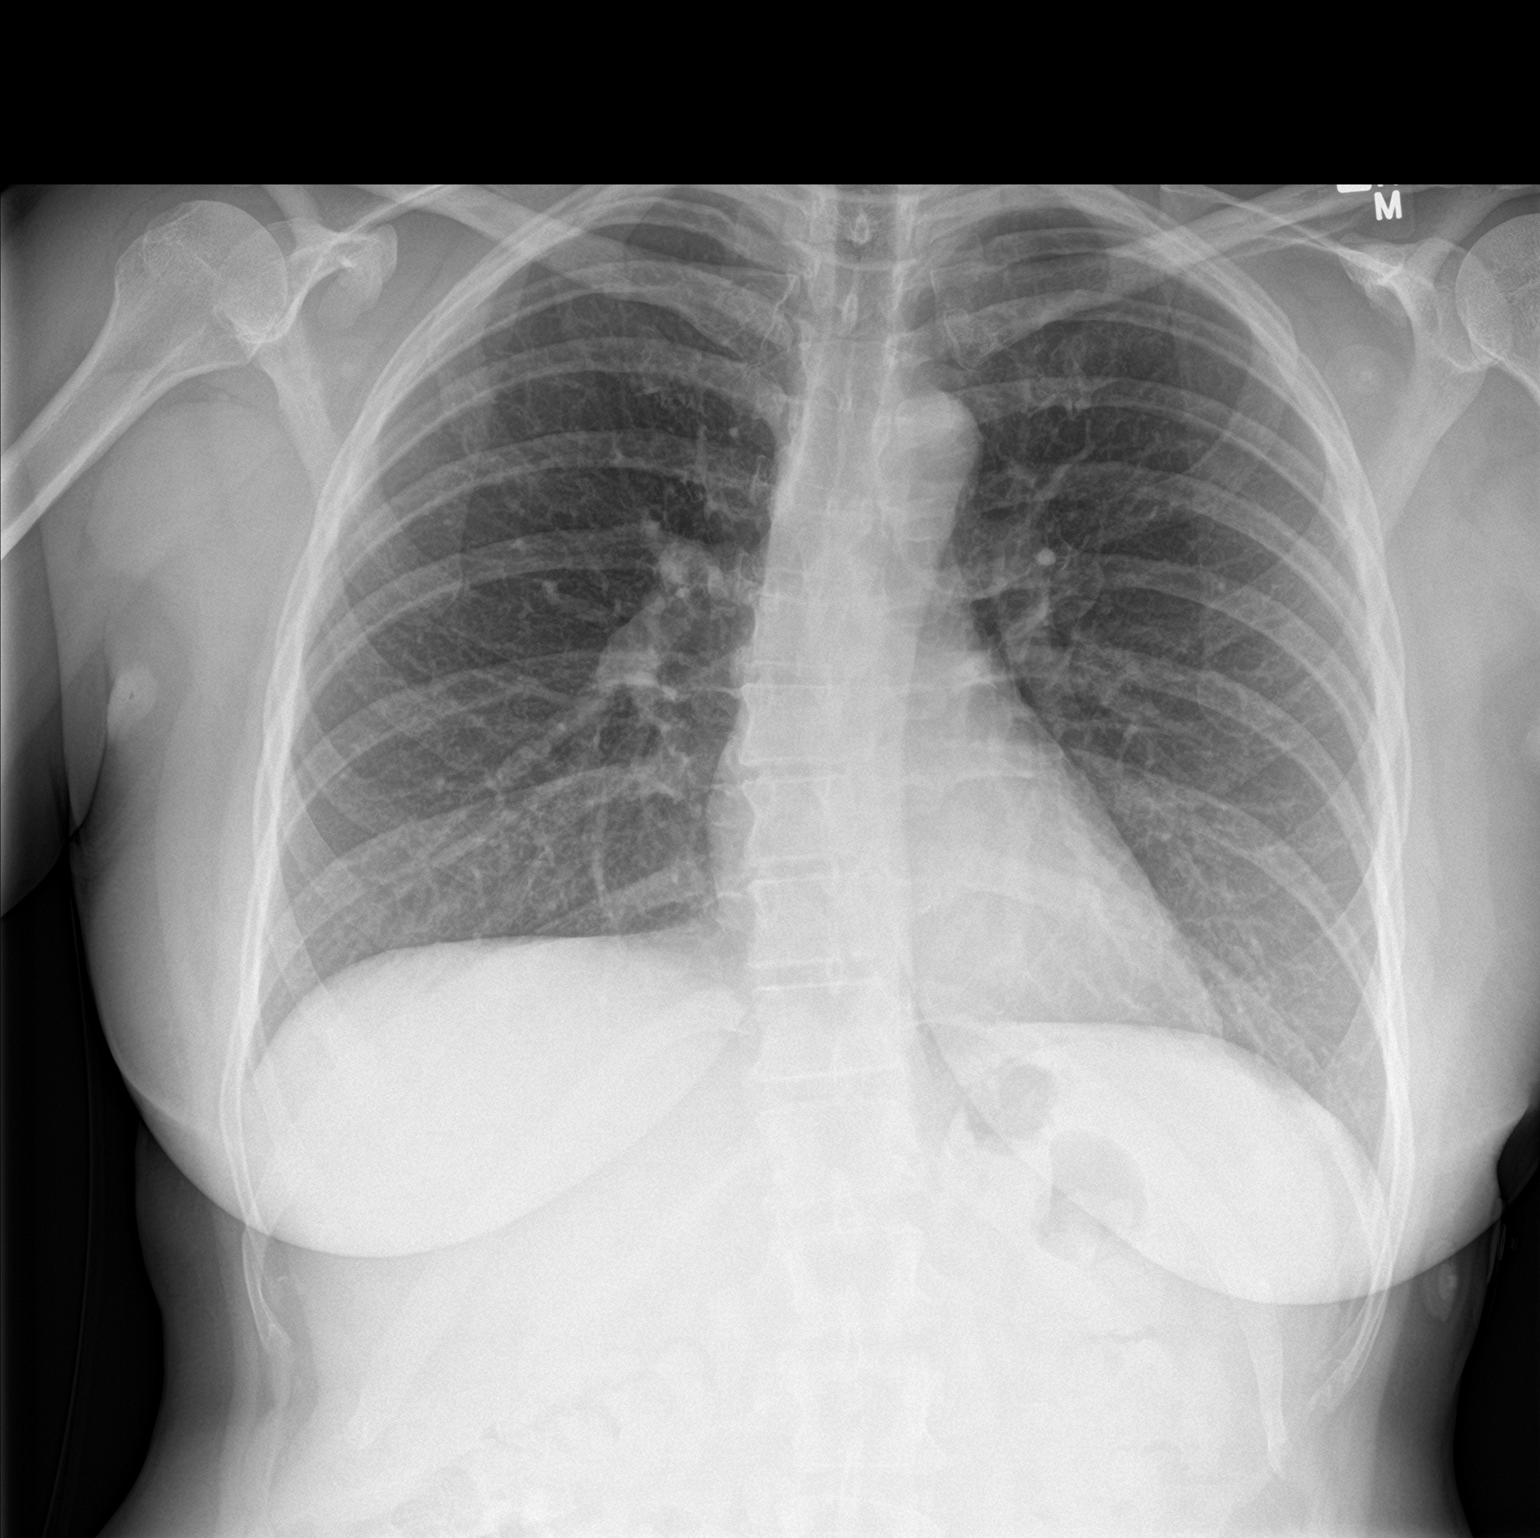

[chest lat]
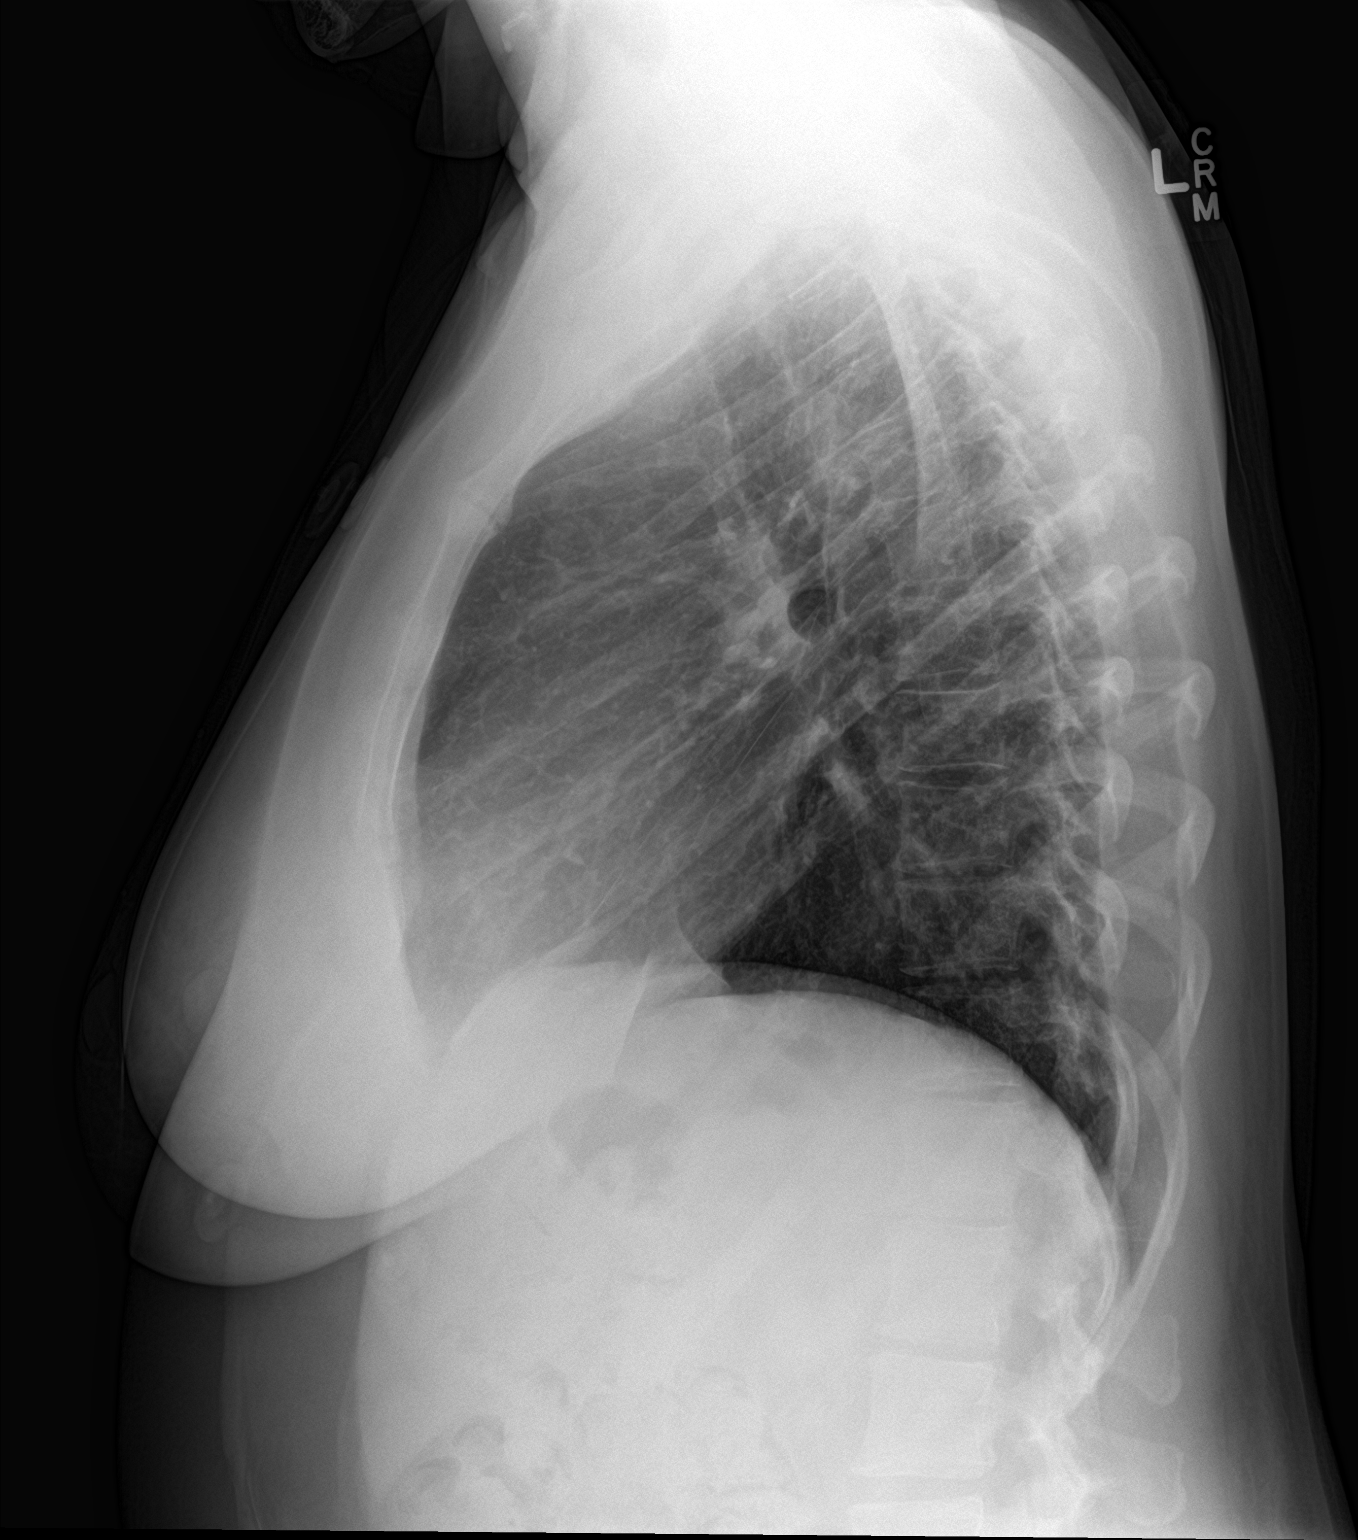

[2 of 2 positions shown; findings below may reference images not displayed]

FINDINGS: The lungs are well-aerated and clear. There is no evidence of focal
opacification, pleural effusion or pneumothorax.

The heart is normal in size; the mediastinal contour is within
normal limits. No acute osseous abnormalities are seen.
IMPRESSION: No acute cardiopulmonary process seen.

## 2016-09-11 MED ORDER — IOPAMIDOL (ISOVUE-370) INJECTION 76%
INTRAVENOUS | Status: AC
  Administered 2016-09-11: 100 mL
  Filled 2016-09-11: qty 100

## 2016-09-11 NOTE — ED Notes (Signed)
Got patient into a gown patient is waiting on provider pt is resting

## 2016-09-11 NOTE — ED Notes (Signed)
Pt c/o increased SOB oxygen saturations at 100% pt wanted to walk, ambulated pt with pulse ox

## 2016-09-11 NOTE — ED Provider Notes (Signed)
[facility] DEPT Provider Note   CSN: [id number] Arrival date & time: 09/11/16  2703     History   Chief Complaint Chief Complaint  Patient presents with  . Shortness of Breath    HPI Lisa Novak is a 46 y.o. female who presents today with chief complaint "symptoms of panic attack" since last night. She states at around 2200 to 0130 into the morning pt developed anxious feeling, shortness of breath and "weight on my chest" with deep inspiration but no chest pain. She states she has had similar symptoms in the past which she believes to be panic attacks but those only last a few minutes at a time while these symptoms have been constant. At that time she took methocarbomol, gabapentin, and 2 dilaudid which were not helpful for her symptoms. She states she was eventually able to sleep for some time but woke up with same symptoms. She was recently released from [facility] on Sunday s/p lumbar fusion of L4/5 and endorses continued 9/10 low back pain that does not radiate to the legs with no numbness, tingling, or weakness and incisional site pain. Denies cough, hemoptysis, estrogen therapy, prior dvt, no recent travel. . Denies abd pain, nvd, no melena, hematuria, or dysuria.  The history is provided by the patient.    Past Medical History:  Diagnosis Date  . Diabetes type 2, controlled ([place])   . Diverticulitis   . GERD (gastroesophageal reflux disease)   . Hypertension   . PONV (postoperative nausea and vomiting)   . SVD (spontaneous vaginal delivery)    x 3  . Umbilical hernia     Patient Active Problem List   Diagnosis Date Noted  . Ovarian cyst 04/13/2014  . Appendicitis, acute 02/11/2014    Past Surgical History:  Procedure Laterality Date  . ABDOMINAL HYSTERECTOMY    . ABDOMINOPLASTY    . APPENDECTOMY    . HERNIA REPAIR    . LAPAROSCOPIC APPENDECTOMY N/A 02/11/2014   Procedure: APPENDECTOMY LAPAROSCOPIC;  Surgeon: Autumn Messing III, MD;  Location: [facility];  Service: General;   Laterality: N/A;  . ROBOTIC ASSISTED LAPAROSCOPIC OVARIAN CYSTECTOMY Left 04/13/2014   Procedure: ROBOTIC ASSISTED LAPAROSCOPIC Left OVARIAN CYSTECTOMY, BILATERAL SALPINGECTOMY,lysis of adhesions;  Surgeon: Princess Bruins, MD;  Location: [facility];  Service: Gynecology;  Laterality: Left;  . WISDOM TOOTH EXTRACTION      OB History    No data available       Home Medications    Prior to Admission medications   Medication Sig Start Date End Date Taking? Authorizing Provider  calcium carbonate (TUMS - DOSED IN MG ELEMENTAL CALCIUM) 500 MG chewable tablet Chew 2 tablets by mouth daily as needed for indigestion or heartburn. Reported on 08/17/2015    Historical Provider, MD  CAMILA 0.35 MG tablet Take 1 tablet by mouth daily. Reported on 08/17/2015 03/22/14   Historical Provider, MD  hydrocodone-ibuprofen (VICOPROFEN) 5-200 MG per tablet Take 1 tablet by mouth every 6 (six) hours as needed for pain. Patient not taking: Reported on 08/17/2015 04/13/14   Princess Bruins, MD  lansoprazole (PREVACID) 15 MG capsule Take 15 mg by mouth daily at 12 noon.    Historical Provider, MD  levofloxacin (LEVAQUIN) 500 MG tablet Take 1 tablet (500 mg total) by mouth daily. Patient not taking: Reported on 08/17/2015 09/17/14   Liam Graham, PA-C  losartan-hydrochlorothiazide (HYZAAR) 50-12.5 MG per tablet Take 1 tablet by mouth daily.    Historical Provider, MD  methocarbamol (ROBAXIN) 500 MG tablet  Take 1 tablet (500 mg total) by mouth 2 (two) times daily. 12/23/15   Heather Laisure, PA-C  naproxen (NAPROSYN) 500 MG tablet Take 1 tablet (500 mg total) by mouth 2 (two) times daily. 12/23/15   Heather Laisure, PA-C  phentermine (ADIPEX-P) 37.5 MG tablet Take 18.75 mg by mouth daily before breakfast.    Historical Provider, MD  phentermine 37.5 MG capsule Take 37.5 mg by mouth every morning. Reported on 08/17/2015    Historical Provider, MD  promethazine (PHENERGAN) 25 MG tablet Take 1 tablet (25 mg total) by mouth  every 6 (six) hours as needed for nausea or vomiting. Patient not taking: Reported on 08/17/2015 04/13/14   Princess Bruins, MD  ranitidine (ZANTAC) 150 MG tablet Take 150 mg by mouth daily. Reported on 08/17/2015    Historical Provider, MD  traMADol (ULTRAM) 50 MG tablet Take 1 tablet (50 mg total) by mouth every 6 (six) hours as needed. 12/23/15   Hyman Bible, PA-C    Family History No family history on file.  Social History Social History  Substance Use Topics  . Smoking status: Never Smoker  . Smokeless tobacco: Never Used  . Alcohol use No     Allergies   Patient has no known allergies.   Review of Systems Review of Systems  Constitutional: Negative for chills and fever.  Respiratory: Positive for chest tightness and shortness of breath.   Cardiovascular: Negative for chest pain and leg swelling.  Gastrointestinal: Negative for abdominal pain, blood in stool, nausea and vomiting.  Genitourinary: Negative for dysuria and hematuria.  Musculoskeletal: Positive for back pain. Negative for neck pain.  Neurological: Negative for weakness, numbness and headaches.     Physical Exam Updated Vital Signs BP 118/84   Pulse 94   Temp 98.1 F (36.7 C) (Oral)   Resp 17   SpO2 97%   Physical Exam  Constitutional: She is oriented to person, place, and time. She appears well-developed and well-nourished. No distress.  Anxious-appearing and somewhat tremulous  HENT:  Head: Normocephalic and atraumatic.  Eyes: Conjunctivae and EOM are normal.  Neck: Neck supple. No JVD present. No tracheal deviation present.  Cardiovascular: Normal rate, regular rhythm, normal heart sounds and intact distal pulses.   No murmur heard. 2+ dp/pt pulses bt, 2+ radial pulses with good capillary refill, Negative Homan's bilaterally  Pulmonary/Chest: Effort normal and breath sounds normal. No respiratory distress.  No accessory muscle use, no chest wall tenderness  Abdominal: Soft. There is no  tenderness.  Decreased bowel sounds  Musculoskeletal: She exhibits no edema.  TTP LSP overlying L4/5, no paraspinal muscle tenderness. 5/5 strength LLE, pt reluctant to move RLE due to pain and weakness since surgery  Neurological: She is alert and oriented to person, place, and time.  Fluent speech, no facial droop, gross sensation intact  Skin: Skin is warm and dry. Capillary refill takes less than 2 seconds. She is not diaphoretic.  Multiple bandaged surgical incisions, no drainage or bleeding on bandages. No surrounding erythema  Psychiatric: She has a normal mood and affect.  Nursing note and vitals reviewed.    ED Treatments / Results  Labs (all labs ordered are listed, but only abnormal results are displayed) Labs Reviewed  BASIC METABOLIC PANEL - Abnormal; Notable for the following:       Result Value   Chloride 97 (*)    Glucose, Bld 134 (*)    All other components within normal limits  D-DIMER, QUANTITATIVE (NOT AT [facility]) - Abnormal;  Notable for the following:    D-Dimer, Quant 1.77 (*)    All other components within normal limits  CBC  I-STAT TROPOININ, ED  I-STAT TROPOININ, ED    EKG  EKG Interpretation  Date/Time:  Tuesday September 11 2016 16:41:24 EDT Ventricular Rate:  113 PR Interval:  152 QRS Duration: 84 QT Interval:  326 QTC Calculation: 447 R Axis:   62 Text Interpretation:  Sinus tachycardia Otherwise normal ECG No previous ECGs available Confirmed by YAO  MD, DAVID ([id number]) on 09/11/2016 6:19:06 PM       Radiology Dg Chest 2 View  Result Date: 09/11/2016 CLINICAL DATA:  Acute onset of shortness of breath. Anxiety attack. Initial encounter. EXAM: CHEST  2 VIEW COMPARISON:  Chest radiograph performed 06/19/2010 FINDINGS: The lungs are well-aerated and clear. There is no evidence of focal opacification, pleural effusion or pneumothorax. The heart is normal in size; the mediastinal contour is within normal limits. No acute osseous abnormalities are seen.  IMPRESSION: No acute cardiopulmonary process seen. Electronically Signed   By: Garald Balding M.D.   On: 09/11/2016 17:16   Ct Angio Chest Pe W And/or Wo Contrast  Result Date: 09/11/2016 CLINICAL DATA:  Lumbar fusion surgery on Wednesday, shortness of breath EXAM: CT ANGIOGRAPHY CHEST WITH CONTRAST TECHNIQUE: Multidetector CT imaging of the chest was performed using the standard protocol during bolus administration of intravenous contrast. Multiplanar CT image reconstructions and MIPs were obtained to evaluate the vascular anatomy. CONTRAST:  80 mL Isovue 370 intravenous COMPARISON:  Chest x-ray 09/11/2016 FINDINGS: Cardiovascular: Suboptimal opacification of the distal most pulmonary artery branches which are also partially obscured by respiratory motion artifact. No gross filling defects within the main or central segmental pulmonary arterial branches. Non aneurysmal aorta. Normal heart size. No pericardial effusion. Mediastinum/Nodes: No enlarged mediastinal, hilar, or axillary lymph nodes. Thyroid gland, trachea, and esophagus demonstrate no significant findings. Lungs/Pleura: Lungs are clear. No pleural effusion or pneumothorax. Upper Abdomen: No acute abnormality. Musculoskeletal: No chest wall abnormality. No acute or significant osseous findings. Review of the MIP images confirms the above findings. IMPRESSION: 1. Suboptimal opacification of distal most arterial branches which are also obscured by motion artifact. No definite acute pulmonary embolus is visualized. 2. Clear lung fields Electronically Signed   By: Donavan Foil M.D.   On: 09/11/2016 22:10    Procedures Procedures (including critical care time)  Medications Ordered in ED Medications  iopamidol (ISOVUE-370) 76 % injection (100 mLs  Contrast Given 09/11/16 2128)     Initial Impression / Assessment and Plan / ED Course  I have reviewed the triage vital signs and the nursing notes.  Pertinent labs & imaging results that were  available during my care of the patient were reviewed by me and considered in my medical decision making (see chart for details).     45yof 1 week s/p L4/5 fusion with chief complaint anxious feeling and SOB since last night. History anxiety attacks. Pt afebrile, intermittent tachycardia, SpO2 97% on RA. Negative troponin and EKG showing sinus tach but otherwise normal EKG. Low suspicion ACS/MI. Low suspicion PE with only risk factor being recent surgery; D-Dimer positive, obtained CTA to rule out PE. CT showed suboptimal opacification of distal most arterial branches which are also obscured by motion artifact. No definite acute pulmonary embolus visualized. Otherwise clear lung fields. On re-evaluation pt states "I'm feeling a little better" and she appears less anxious and is not tremulous. She states she believes some component of her symptoms may  be related to anesthesia and post-operative pain and anxiety after the surgery. Discussed results with pt and she would like to be discharged home at this time, which I believe to be reasonable given her improvement and low suspicion PE. Discussed follow up with her surgeons within the next 2-3 days if symptoms do not improve. Discussed strict Ed return precautions. Pt verbalized understanding of and agreement with plan and is safe for discharge home at this time.   Final Clinical Impressions(s) / ED Diagnoses   Final diagnoses:  SOB (shortness of breath)    New Prescriptions Discharge Medication List as of 09/11/2016 10:37 PM       Renita Papa, PA-C 09/12/16 0031    Drenda Freeze, MD 09/13/16 2141

## 2016-09-11 NOTE — ED Notes (Signed)
Patient transported to CT 

## 2016-10-01 ENCOUNTER — Emergency Department (HOSPITAL_COMMUNITY)
Admission: EM | Admit: 2016-10-01 | Discharge: 2016-10-01 | Disposition: A | Discharge status: Home / Self Care | Payer: No Typology Code available for payment source | Attending: Emergency Medicine | Admitting: Emergency Medicine

## 2016-10-01 ENCOUNTER — Encounter (HOSPITAL_COMMUNITY): Payer: Self-pay | Admitting: Emergency Medicine

## 2016-10-01 ENCOUNTER — Emergency Department (HOSPITAL_BASED_OUTPATIENT_CLINIC_OR_DEPARTMENT_OTHER)
Admit: 2016-10-01 | Discharge: 2016-10-01 | Disposition: A | Discharge status: Home / Self Care | Payer: No Typology Code available for payment source | Attending: Emergency Medicine | Admitting: Emergency Medicine

## 2016-10-01 DIAGNOSIS — M79604 Pain in right leg: Secondary | ICD-10-CM | POA: Diagnosis not present

## 2016-10-01 DIAGNOSIS — I10 Essential (primary) hypertension: Secondary | ICD-10-CM | POA: Diagnosis not present

## 2016-10-01 DIAGNOSIS — M79609 Pain in unspecified limb: Secondary | ICD-10-CM | POA: Diagnosis not present

## 2016-10-01 DIAGNOSIS — I82401 Acute embolism and thrombosis of unspecified deep veins of right lower extremity: Secondary | ICD-10-CM | POA: Diagnosis present

## 2016-10-01 DIAGNOSIS — E119 Type 2 diabetes mellitus without complications: Secondary | ICD-10-CM | POA: Diagnosis not present

## 2016-10-01 DIAGNOSIS — Z79899 Other long term (current) drug therapy: Secondary | ICD-10-CM | POA: Diagnosis not present

## 2016-10-01 NOTE — Discharge Instructions (Signed)
It was our pleasure to provide your ER care today - we hope that you feel better.  The vascular doppler test was read as showing no blood clots.  Take motrin or aleve as need for pain.  Follow up with your surgeon in the next couple weeks.  Return to ER if worse, new symptoms, high fevers, leg redness/increased swelling, other concern.

## 2016-10-01 NOTE — ED Triage Notes (Signed)
Pt to ER for evaluation of right posterior knee pain that is worse with movement. Pt reports sometimes is warm to touch. Pt PCP sent here for rule out DVT due to recent spinal fusion 4 weeks ago.

## 2016-10-01 NOTE — ED Notes (Signed)
Pt stable, understands discharge instructions, and reasons for return.   

## 2016-10-01 NOTE — ED Provider Notes (Signed)
[facility] DEPT Provider Note   CSN: [id number] Arrival date & time: 10/01/16  1346   By signing my name below, I, Soijett Blue, attest that this documentation has been prepared under the direction and in the presence of Lajean Saver, MD. Electronically Signed: Soijett Blue, ED Scribe. 10/01/16. 5:56 PM.  History   Chief Complaint Chief Complaint  Patient presents with  . DVT    Possible    HPI Lisa Novak is a 46 y.o. female with a PMHx of HTN, DM, who presents to the Emergency Department complaining of need to rule out a possible DVT s/p surgery onset 4 weeks ago. She notes that she had a laminectomy and L4-5 spinal fusion completed at [facility] approximately 4 weeks ago with resulting right posterior thigh and knee pain. Pt reports associated chills, subjective low-grade fever, and blurred vision. Pt has not tried any medications for the relief of her symptoms. She denies numbness, weakness, and any other symptoms.     The history is provided by the patient. No language interpreter was used.    Past Medical History:  Diagnosis Date  . Diabetes type 2, controlled ([place])   . Diverticulitis   . GERD (gastroesophageal reflux disease)   . Hypertension   . PONV (postoperative nausea and vomiting)   . SVD (spontaneous vaginal delivery)    x 3  . Umbilical hernia     Patient Active Problem List   Diagnosis Date Noted  . Ovarian cyst 04/13/2014  . Appendicitis, acute 02/11/2014    Past Surgical History:  Procedure Laterality Date  . ABDOMINAL HYSTERECTOMY    . ABDOMINOPLASTY    . APPENDECTOMY    . HERNIA REPAIR    . LAPAROSCOPIC APPENDECTOMY N/A 02/11/2014   Procedure: APPENDECTOMY LAPAROSCOPIC;  Surgeon: Autumn Messing III, MD;  Location: [facility];  Service: General;  Laterality: N/A;  . ROBOTIC ASSISTED LAPAROSCOPIC OVARIAN CYSTECTOMY Left 04/13/2014   Procedure: ROBOTIC ASSISTED LAPAROSCOPIC Left OVARIAN CYSTECTOMY, BILATERAL SALPINGECTOMY,lysis of adhesions;  Surgeon:  Princess Bruins, MD;  Location: [facility];  Service: Gynecology;  Laterality: Left;  . WISDOM TOOTH EXTRACTION      OB History    No data available       Home Medications    Prior to Admission medications   Medication Sig Start Date End Date Taking? Authorizing Provider  calcium carbonate (TUMS - DOSED IN MG ELEMENTAL CALCIUM) 500 MG chewable tablet Chew 2 tablets by mouth daily as needed for indigestion or heartburn. Reported on 08/17/2015    [provider]  CAMILA 0.35 MG tablet Take 1 tablet by mouth daily. Reported on 08/17/2015 03/22/14   [provider]  hydrocodone-ibuprofen (VICOPROFEN) 5-200 MG per tablet Take 1 tablet by mouth every 6 (six) hours as needed for pain. Patient not taking: Reported on 08/17/2015 04/13/14   Princess Bruins, MD  lansoprazole (PREVACID) 15 MG capsule Take 15 mg by mouth daily at 12 noon.    [provider]  levofloxacin (LEVAQUIN) 500 MG tablet Take 1 tablet (500 mg total) by mouth daily. Patient not taking: Reported on 08/17/2015 09/17/14   Liam Graham, PA-C  losartan-hydrochlorothiazide (HYZAAR) 50-12.5 MG per tablet Take 1 tablet by mouth daily.    [provider]  methocarbamol (ROBAXIN) 500 MG tablet Take 1 tablet (500 mg total) by mouth 2 (two) times daily. 12/23/15   Hyman Bible, PA-C  naproxen (NAPROSYN) 500 MG tablet Take 1 tablet (500 mg total) by mouth 2 (two) times daily. 12/23/15  Hyman Bible, PA-C  phentermine (ADIPEX-P) 37.5 MG tablet Take 18.75 mg by mouth daily before breakfast.    [provider]  phentermine 37.5 MG capsule Take 37.5 mg by mouth every morning. Reported on 08/17/2015    [provider]  promethazine (PHENERGAN) 25 MG tablet Take 1 tablet (25 mg total) by mouth every 6 (six) hours as needed for nausea or vomiting. Patient not taking: Reported on 08/17/2015 04/13/14   Princess Bruins, MD  ranitidine (ZANTAC) 150 MG tablet Take 150 mg by mouth daily.  Reported on 08/17/2015    [provider]  traMADol (ULTRAM) 50 MG tablet Take 1 tablet (50 mg total) by mouth every 6 (six) hours as needed. 12/23/15   Hyman Bible, PA-C    Family History No family history on file.  Social History Social History  Substance Use Topics  . Smoking status: Never Smoker  . Smokeless tobacco: Never Used  . Alcohol use No     Allergies   Patient has no known allergies.   Review of Systems Review of Systems  Constitutional: Positive for chills and fever (subjective).  Eyes: Positive for visual disturbance.  Musculoskeletal: Positive for arthralgias (posterior right knee) and myalgias (posterior right thigh).  Neurological: Negative for weakness and numbness.  All other systems reviewed and are negative.    Physical Exam Updated Vital Signs BP 129/88 (BP Location: Right Arm)   Pulse 94   Temp 97.9 F (36.6 C) (Oral)   Resp 20   Ht 5\' 5"  (1.651 m)   Wt 170 lb (77.1 kg)   SpO2 100%   BMI 28.29 kg/m   Physical Exam  Constitutional: She appears well-developed and well-nourished. No distress.  HENT:  Head: Normocephalic and atraumatic.  Eyes: Conjunctivae are normal.  Neck: Neck supple.  Cardiovascular: Normal rate and intact distal pulses.   Pulmonary/Chest: Effort normal.  Abdominal: She exhibits no distension.  Musculoskeletal: Normal range of motion. She exhibits no edema or tenderness.       Right knee: She exhibits no swelling. No tenderness found.       Right lower leg: She exhibits no tenderness, no bony tenderness and no swelling.  LS spine non tender. Good rom right hip and knee without pain. Right dp/pt 2+. No erythema or swelling to right leg.   Neurological: She is alert.  Ambulates w steady gait.   Skin: Skin is warm and dry. No rash noted.  Psychiatric: She has a normal mood and affect. Her behavior is normal.  Nursing note and vitals reviewed.    ED Treatments / Results  DIAGNOSTIC STUDIES: Oxygen  Saturation is 100% on RA, nl by my interpretation.    COORDINATION OF CARE: 5:54 PM Discussed treatment plan with pt at bedside which includes LE venous and pt agreed to plan.   Radiology No results found.  Procedures Procedures (including critical care time)  Medications Ordered in ED Medications - No data to display   Initial Impression / Assessment and Plan / ED Course  I have reviewed the triage vital signs and the nursing notes.  Pertinent imaging results that were available during my care of the patient were reviewed by me and considered in my medical decision making (see chart for details).  Vascular doppler neg for dvt.   Progress Notes Date of Service: 10/01/2016 3:48 PM Landry Mellow I  Vascular Lab    [] Hide copied text [] Hover for attribution information *PRELIMINARY RESULTS* Vascular Ultrasound Right lower extremity venous duplex has been  completed.  Preliminary findings: No evidence of DVT or baker's cyst.    Landry Mellow, RDMS, RVT  10/01/2016, 3:48 PM     Electronically signed by Landry Mellow I at 10/01/2016 3:48 PM      Final Clinical Impressions(s) / ED Diagnoses   Final diagnoses:  None    New Prescriptions New Prescriptions   No medications on file   I personally performed the services described in this documentation, which was scribed in my presence. The recorded information has been reviewed and considered. Lajean Saver, MD     Lajean Saver, MD 10/01/16 [phone]

## 2016-10-01 NOTE — Progress Notes (Signed)
*  PRELIMINARY RESULTS* Vascular Ultrasound Right lower extremity venous duplex has been completed.  Preliminary findings: No evidence of DVT or baker's cyst.    Landry Mellow, RDMS, RVT  10/01/2016, 3:48 PM

## 2017-07-08 ENCOUNTER — Other Ambulatory Visit: Payer: Self-pay | Admitting: Family Medicine

## 2017-07-08 ENCOUNTER — Ambulatory Visit
Admission: RE | Admit: 2017-07-08 | Discharge: 2017-07-08 | Disposition: A | Discharge status: Home / Self Care | Payer: No Typology Code available for payment source | Source: Ambulatory Visit | Attending: Family Medicine | Admitting: Family Medicine

## 2017-07-08 DIAGNOSIS — R1030 Lower abdominal pain, unspecified: Secondary | ICD-10-CM

## 2017-07-08 IMAGING — CT CT ABD-PELV W/ CM
1 of 3 series · 14 of 32 positions shown, 18 images · IV contrast (APPLIED)
Comparison: 02/11/2014

CLINICAL DATA: 46-year-old female with a history of lower abdominal
pain. Recent lumbar fusion 06/19/2017

EXAM:
CT ABDOMEN AND PELVIS WITH CONTRAST
TECHNIQUE: Multidetector CT imaging of the abdomen and pelvis was performed
using the standard protocol following bolus administration of
intravenous contrast.
CONTRAST:  100mL PMXCUP-OTT IOPAMIDOL (PMXCUP-OTT) INJECTION 61%

[Series 2: abd/pelvis w/cm · axial · 0.86mm/px · z∈[-697,-257]mm · 14 of 98 slices shown, 18 images]
[im 5/98  soft-tissue]
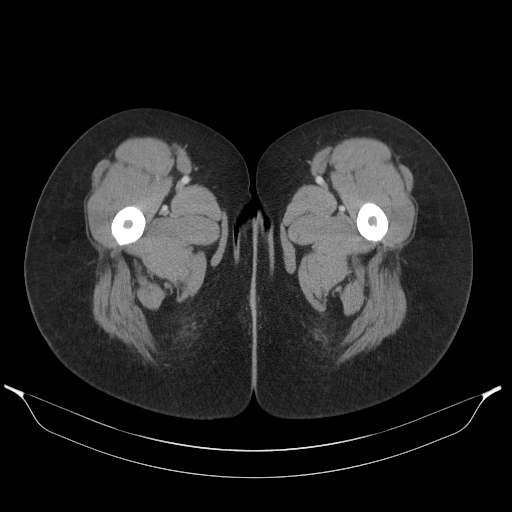
[im 5/98  bone]
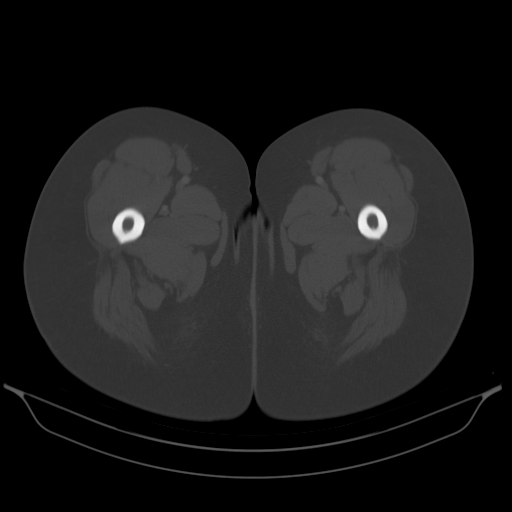
[im 14/98  soft-tissue]
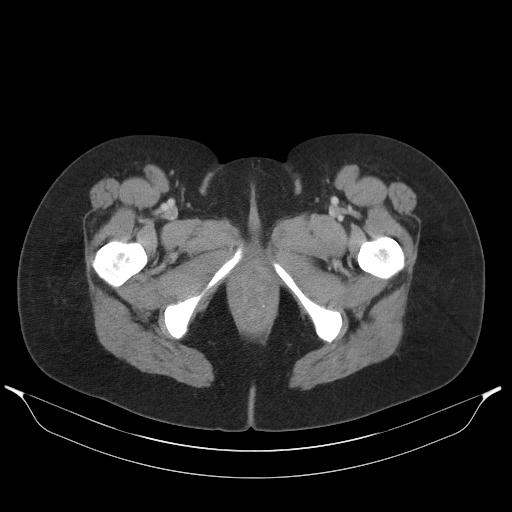
[im 24/98  soft-tissue]
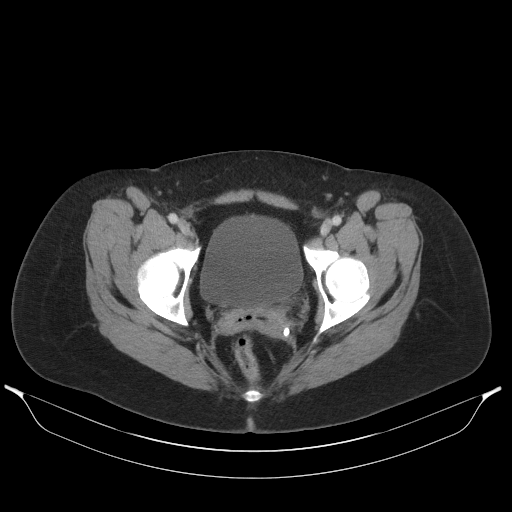
[im 28/98  soft-tissue]
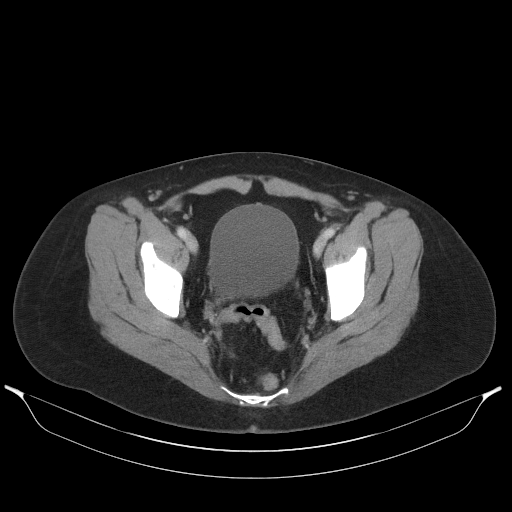
[im 37/98  soft-tissue]
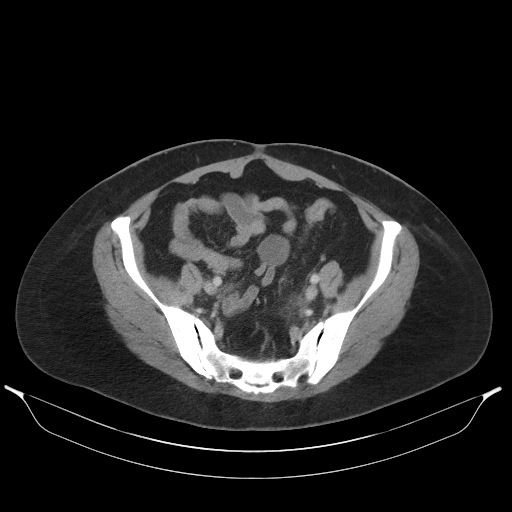
[im 47/98  soft-tissue]
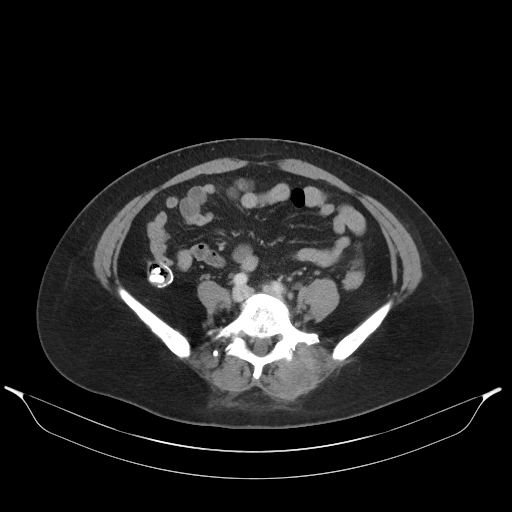
[im 51/98  soft-tissue]
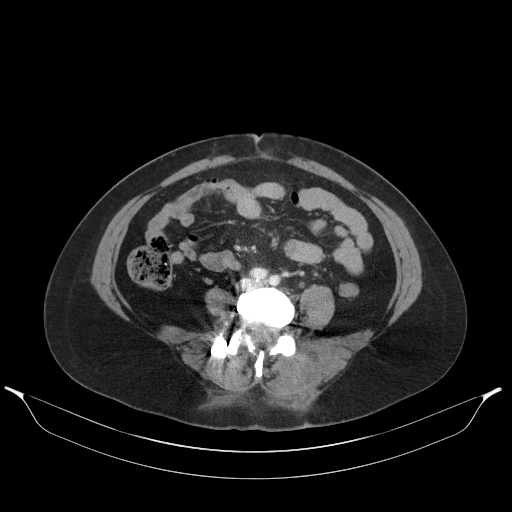
[im 61/98  soft-tissue]
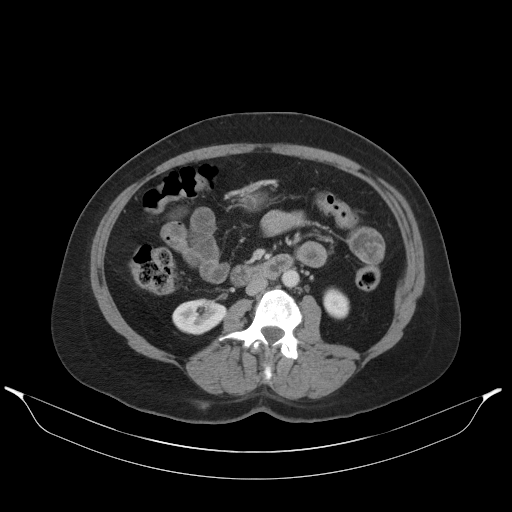
[im 70/98  soft-tissue]
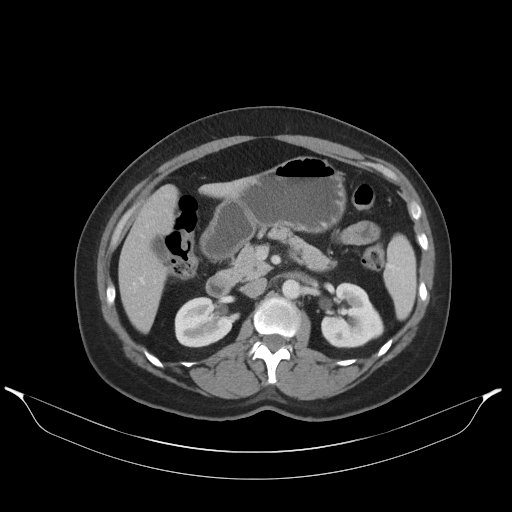
[im 70/98  bone]
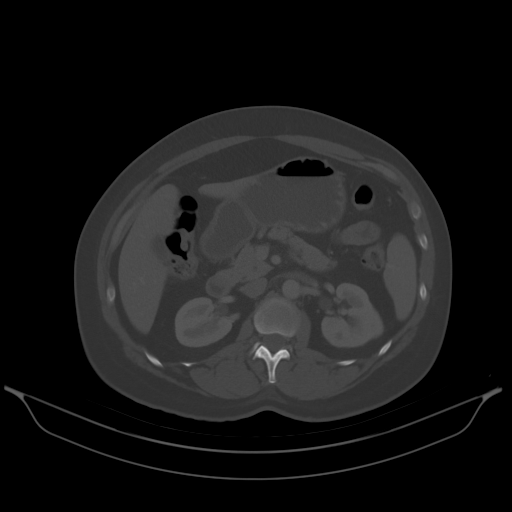
[im 74/98  soft-tissue]
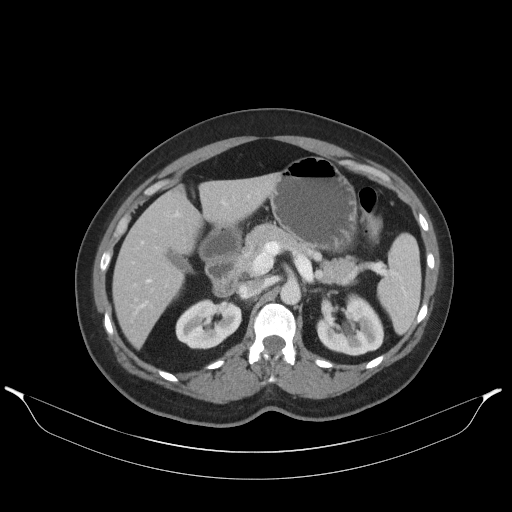
[im 79/98  lung]
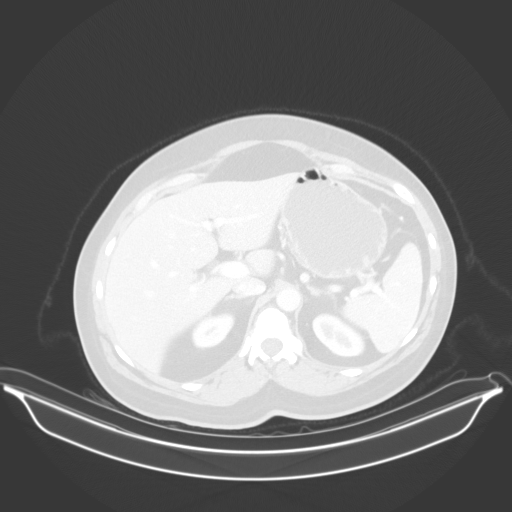
[im 84/98  soft-tissue]
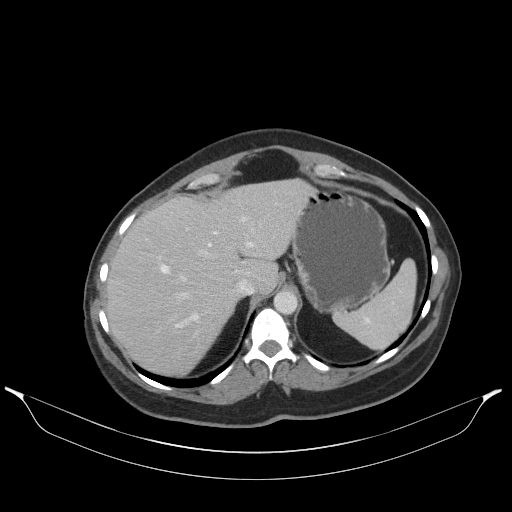
[im 84/98  lung]
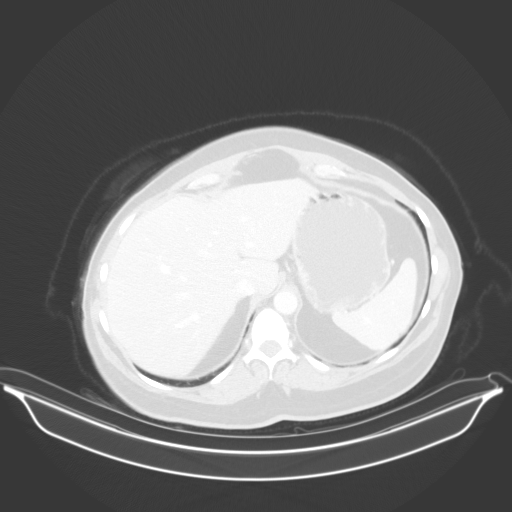
[im 88/98  lung]
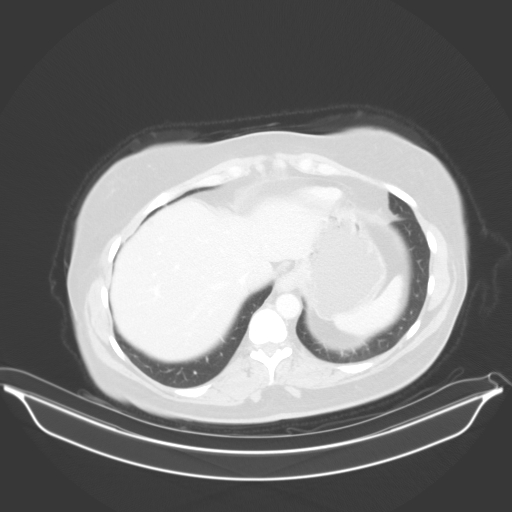
[im 93/98  soft-tissue]
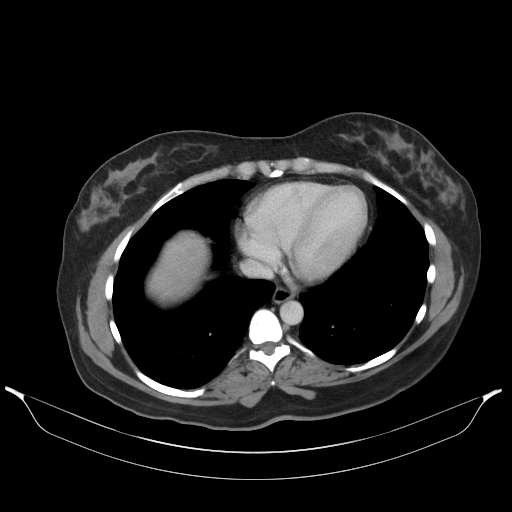
[im 93/98  lung]
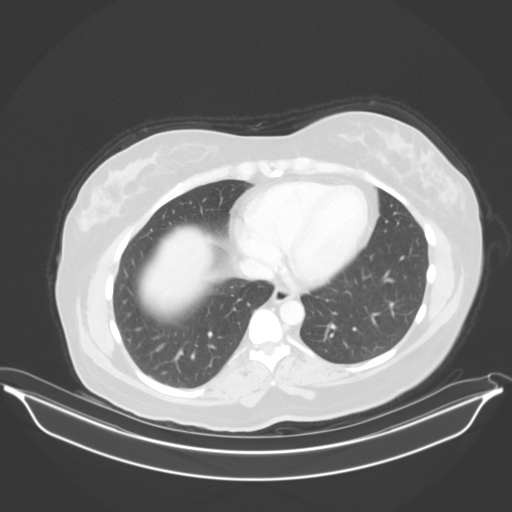

[14 of 32 positions shown; findings below may reference images not displayed]

FINDINGS: Lower chest: No acute abnormality.

Hepatobiliary: No focal liver abnormality is seen. No gallstones,
gallbladder wall thickening, or biliary dilatation.

Pancreas: Unremarkable. No pancreatic ductal dilatation or
surrounding inflammatory changes.

Spleen: Normal in size without focal abnormality.

Adrenals/Urinary Tract: Unremarkable appearance of the adrenal
glands. No evidence of hydronephrosis of the right or left kidney.
No nephrolithiasis. Unremarkable course of the bilateral ureters.
Unremarkable appearance of the urinary bladder.

Stomach/Bowel: Unremarkable appearance of stomach. Unremarkable
small bowel. No transition point. No focal fluid or focal
inflammatory changes. No significant stool burden. No transition
point. Surgical changes of appendectomy.

Colonic diverticular present. There are subtle inflammatory changes
with thickening of the fascia adjacent to the sigmoid colon in the
left aspect of the pelvis. Best appreciated on the coronal images.

Vascular/Lymphatic: No atherosclerotic changes. Unremarkable
abdominal aorta. Unremarkable mesenteric and bilateral renal
arteries. Iliac and proximal femoral arteries patent.

Reproductive: Hysterectomy. Cystic structures with associated with
bilateral adnexa, neither of which require specific follow-up in a
patient of this age.

Other: Small fat containing umbilical hernia.

Musculoskeletal: No acute displaced fracture. Surgical changes of
posterior lumbar interbody fusion of L4-L5 with right pedicle screw
and rod fixation and interbody spacer placement.
IMPRESSION: Early changes of acute diverticulitis of the sigmoid colon, with no
evidence of perforation or abscess.

Surgical changes of posterior lumbar interbody fusion of L4-L5, as
above.

## 2017-07-08 MED ORDER — IOPAMIDOL (ISOVUE-300) INJECTION 61%
100.0000 mL | Freq: Once | INTRAVENOUS | Status: AC | PRN
  Administered 2017-07-08: 100 mL via INTRAVENOUS

## 2017-07-24 ENCOUNTER — Other Ambulatory Visit: Payer: Self-pay | Admitting: Obstetrics and Gynecology

## 2017-07-24 DIAGNOSIS — Z1231 Encounter for screening mammogram for malignant neoplasm of breast: Secondary | ICD-10-CM

## 2017-08-13 ENCOUNTER — Ambulatory Visit
Admission: RE | Admit: 2017-08-13 | Discharge: 2017-08-13 | Disposition: A | Discharge status: Home / Self Care | Payer: No Typology Code available for payment source | Source: Ambulatory Visit | Attending: Obstetrics and Gynecology | Admitting: Obstetrics and Gynecology

## 2017-08-13 DIAGNOSIS — Z1231 Encounter for screening mammogram for malignant neoplasm of breast: Secondary | ICD-10-CM

## 2017-08-13 IMAGING — MG DIGITAL SCREENING BILATERAL MAMMOGRAM WITH TOMO AND CAD
8 series · 8 of 24 positions shown · non-contrast
Comparison: Previous exam(s).

CLINICAL DATA: Screening.

EXAM:
DIGITAL SCREENING BILATERAL MAMMOGRAM WITH TOMO AND CAD

[R MLO synth-2D]
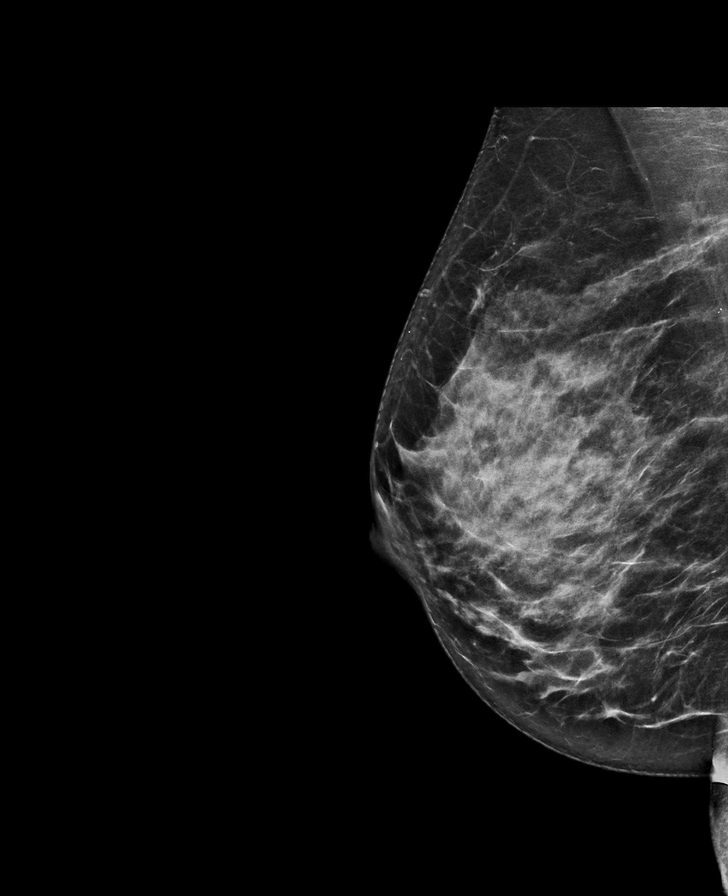

[R CC synth-2D]
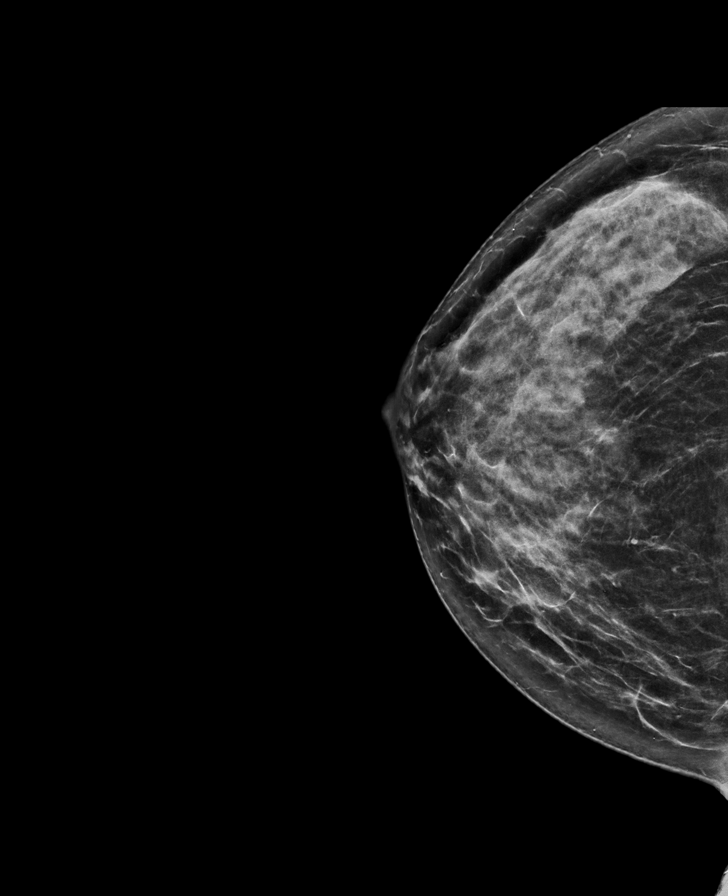

[L CC synth-2D]
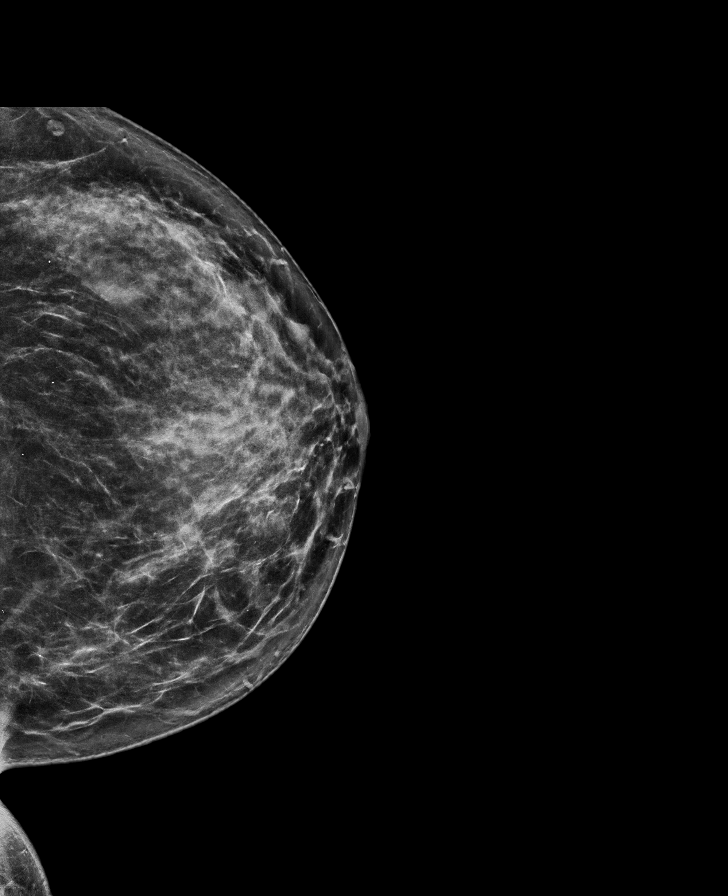

[L MLO synth-2D]
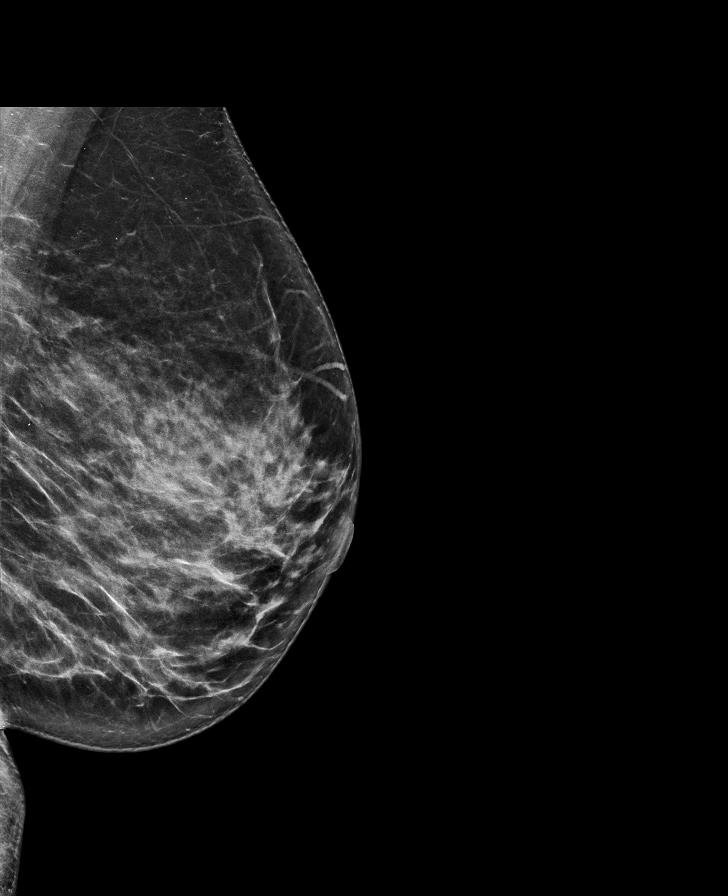

[L MLO tomo · tomo slice 40/79.0]
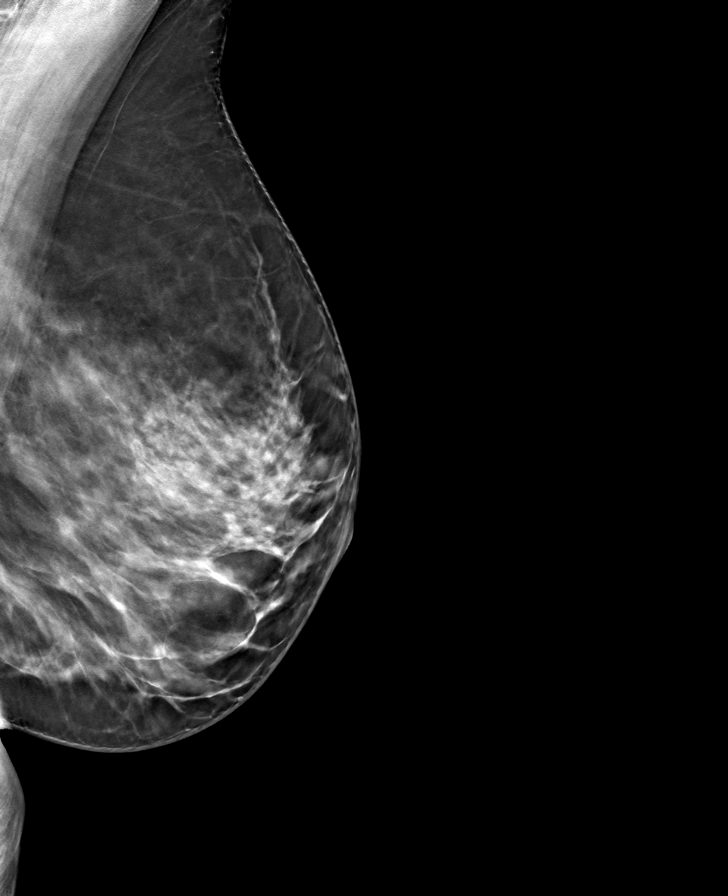

[L CC tomo · tomo slice 39/76.0]
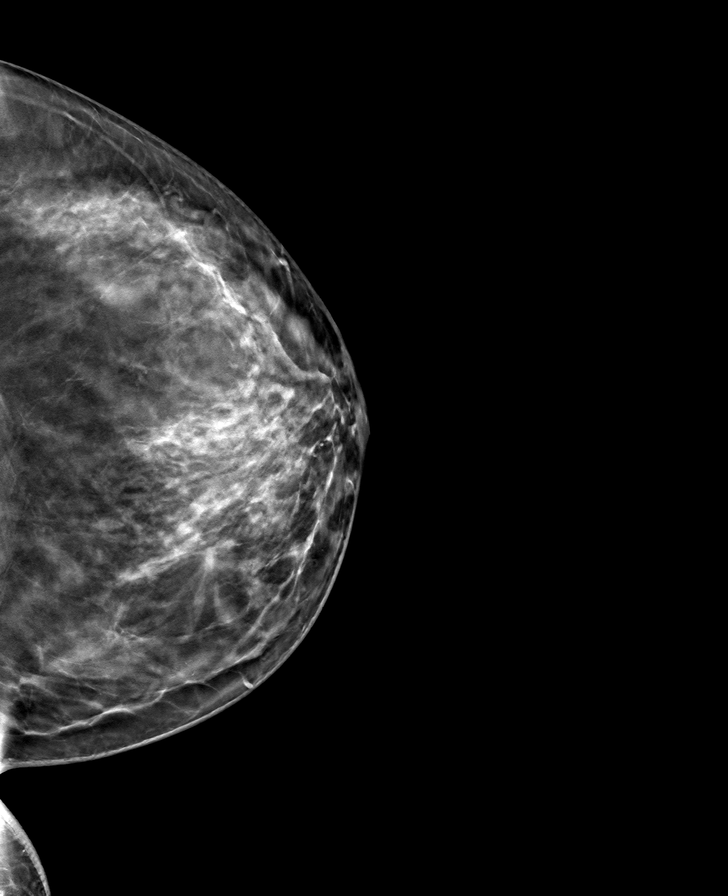

[R MLO tomo · tomo slice 37/74.0]
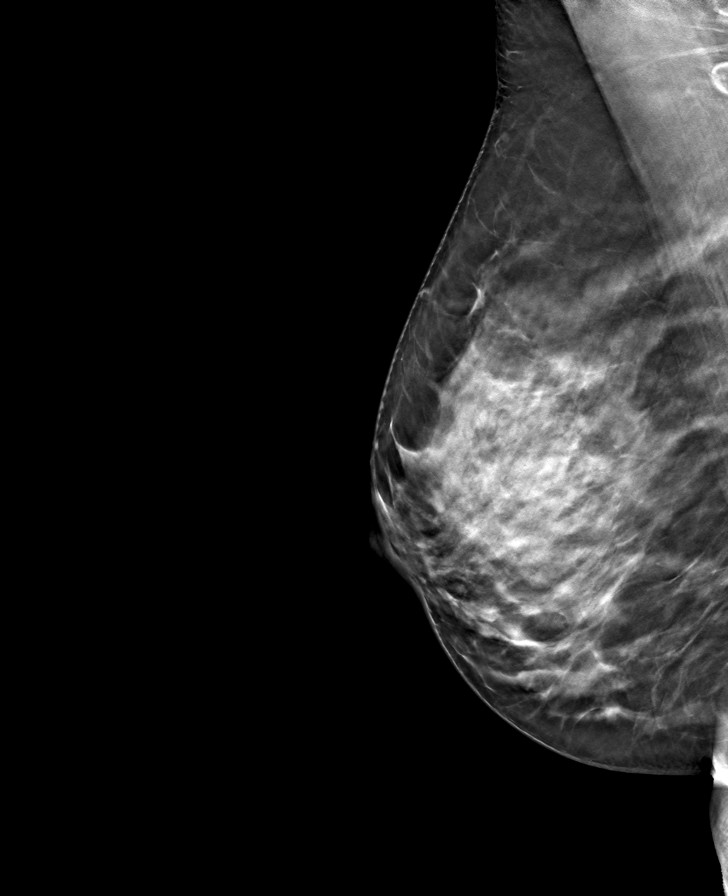

[R CC tomo · tomo slice 37/72.0]
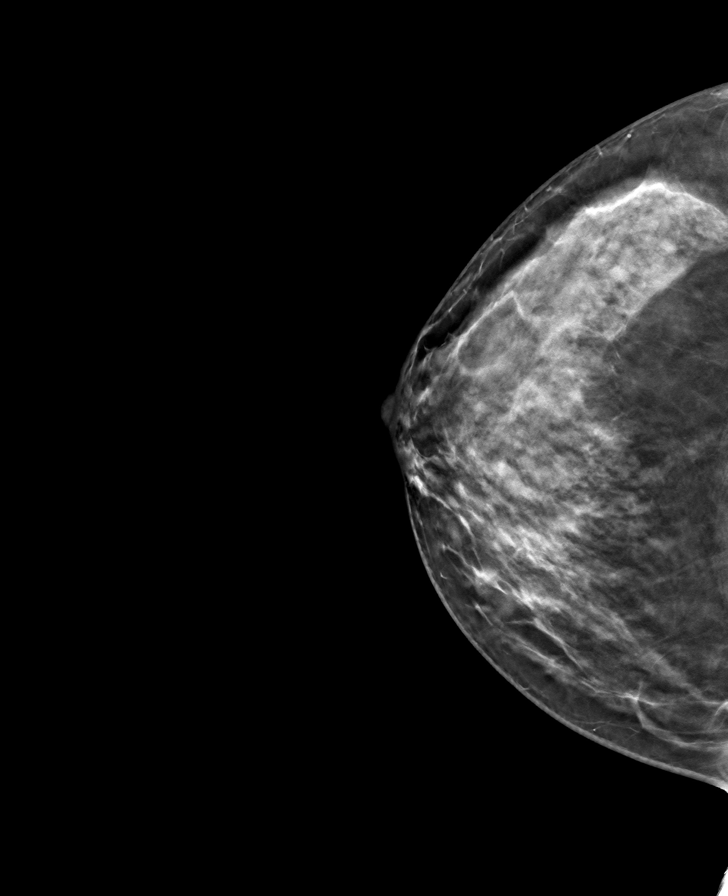

[8 of 24 positions shown; findings below may reference images not displayed]

ACR Breast Density Category c: The breast tissue is heterogeneously
dense, which may obscure small masses.
FINDINGS: There are no findings suspicious for malignancy. Images were
processed with CAD.
IMPRESSION: No mammographic evidence of malignancy. A result letter of this
screening mammogram will be mailed directly to the patient.

RECOMMENDATION:
Screening mammogram in one year. (Code:FT-U-LHB)

BI-RADS CATEGORY  1: Negative.

## 2018-07-15 ENCOUNTER — Other Ambulatory Visit: Payer: Self-pay | Admitting: Obstetrics and Gynecology

## 2018-07-15 DIAGNOSIS — Z1231 Encounter for screening mammogram for malignant neoplasm of breast: Secondary | ICD-10-CM

## 2018-07-28 ENCOUNTER — Emergency Department (HOSPITAL_COMMUNITY)
Admission: EM | Admit: 2018-07-28 | Discharge: 2018-07-29 | Disposition: A | Discharge status: Home / Self Care | Payer: No Typology Code available for payment source | Attending: Emergency Medicine | Admitting: Emergency Medicine

## 2018-07-28 ENCOUNTER — Encounter (HOSPITAL_COMMUNITY): Payer: Self-pay

## 2018-07-28 ENCOUNTER — Emergency Department (HOSPITAL_COMMUNITY): Payer: No Typology Code available for payment source

## 2018-07-28 ENCOUNTER — Other Ambulatory Visit: Payer: Self-pay

## 2018-07-28 DIAGNOSIS — R0789 Other chest pain: Secondary | ICD-10-CM | POA: Diagnosis not present

## 2018-07-28 DIAGNOSIS — R05 Cough: Secondary | ICD-10-CM | POA: Insufficient documentation

## 2018-07-28 DIAGNOSIS — R071 Chest pain on breathing: Secondary | ICD-10-CM | POA: Diagnosis not present

## 2018-07-28 DIAGNOSIS — E119 Type 2 diabetes mellitus without complications: Secondary | ICD-10-CM | POA: Diagnosis not present

## 2018-07-28 DIAGNOSIS — Z79899 Other long term (current) drug therapy: Secondary | ICD-10-CM | POA: Insufficient documentation

## 2018-07-28 DIAGNOSIS — I10 Essential (primary) hypertension: Secondary | ICD-10-CM | POA: Diagnosis not present

## 2018-07-28 LAB — BASIC METABOLIC PANEL
Anion gap: 10 (ref 5–15)
BUN: 10 mg/dL (ref 6–20)
CHLORIDE: 104 mmol/L (ref 98–111)
CO2: 22 mmol/L (ref 22–32)
Calcium: 9.1 mg/dL (ref 8.9–10.3)
Creatinine, Ser: 0.82 mg/dL (ref 0.44–1.00)
GFR calc Af Amer: >60 mL/min (ref >60)
GFR calc non Af Amer: >60 mL/min (ref >60)
GLUCOSE: 119 mg/dL — AB (ref 70–99)
Potassium: 3.1 mmol/L — ABNORMAL LOW (ref 3.5–5.1)
Sodium: 136 mmol/L (ref 135–145)

## 2018-07-28 LAB — I-STAT TROPONIN, ED: Troponin i, poc: 0.01 ng/mL (ref 0.00–0.08)

## 2018-07-28 LAB — CBC
HCT: 41 % (ref 36.0–46.0)
Hemoglobin: 13.8 g/dL (ref 12.0–15.0)
MCH: 30.7 pg (ref 26.0–34.0)
MCHC: 33.7 g/dL (ref 30.0–36.0)
MCV: 91.3 fL (ref 80.0–100.0)
PLATELETS: 264 10*3/uL (ref 150–400)
RBC: 4.49 MIL/uL (ref 3.87–5.11)
RDW: 12.9 % (ref 11.5–15.5)
WBC: 13 10*3/uL — ABNORMAL HIGH (ref 4.0–10.5)
nRBC: 0 % (ref 0.0–0.2)

## 2018-07-28 LAB — I-STAT BETA HCG BLOOD, ED (MC, WL, AP ONLY): I-stat hCG, quantitative: <5 m[IU]/mL (ref <5)

## 2018-07-28 IMAGING — DX DG CHEST 2V
2 series · 2 of 2 positions shown · non-contrast
Comparison: 09/11/2016

CLINICAL DATA: Chest pain

EXAM:
CHEST - 2 VIEW

[chest pa]
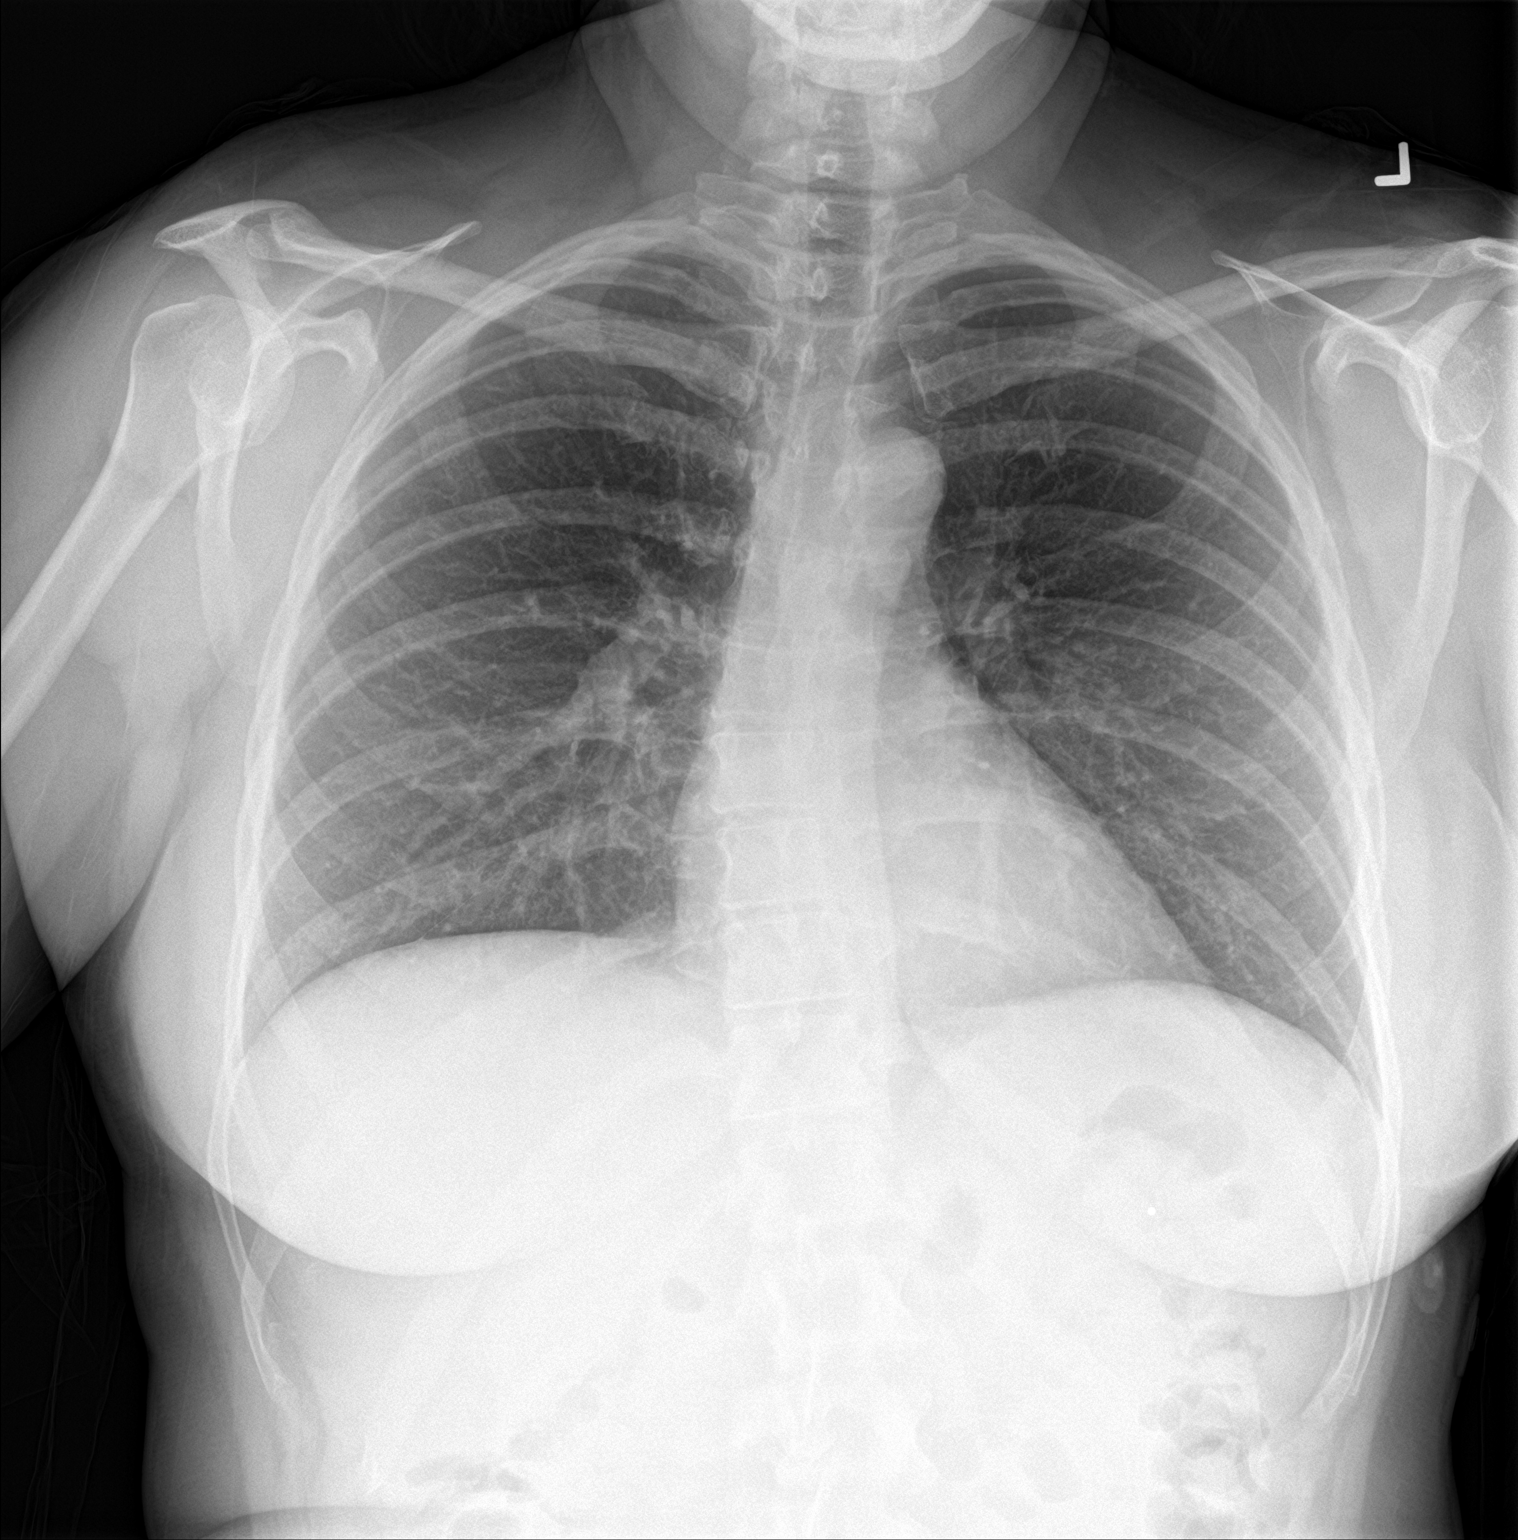

[chest lat]
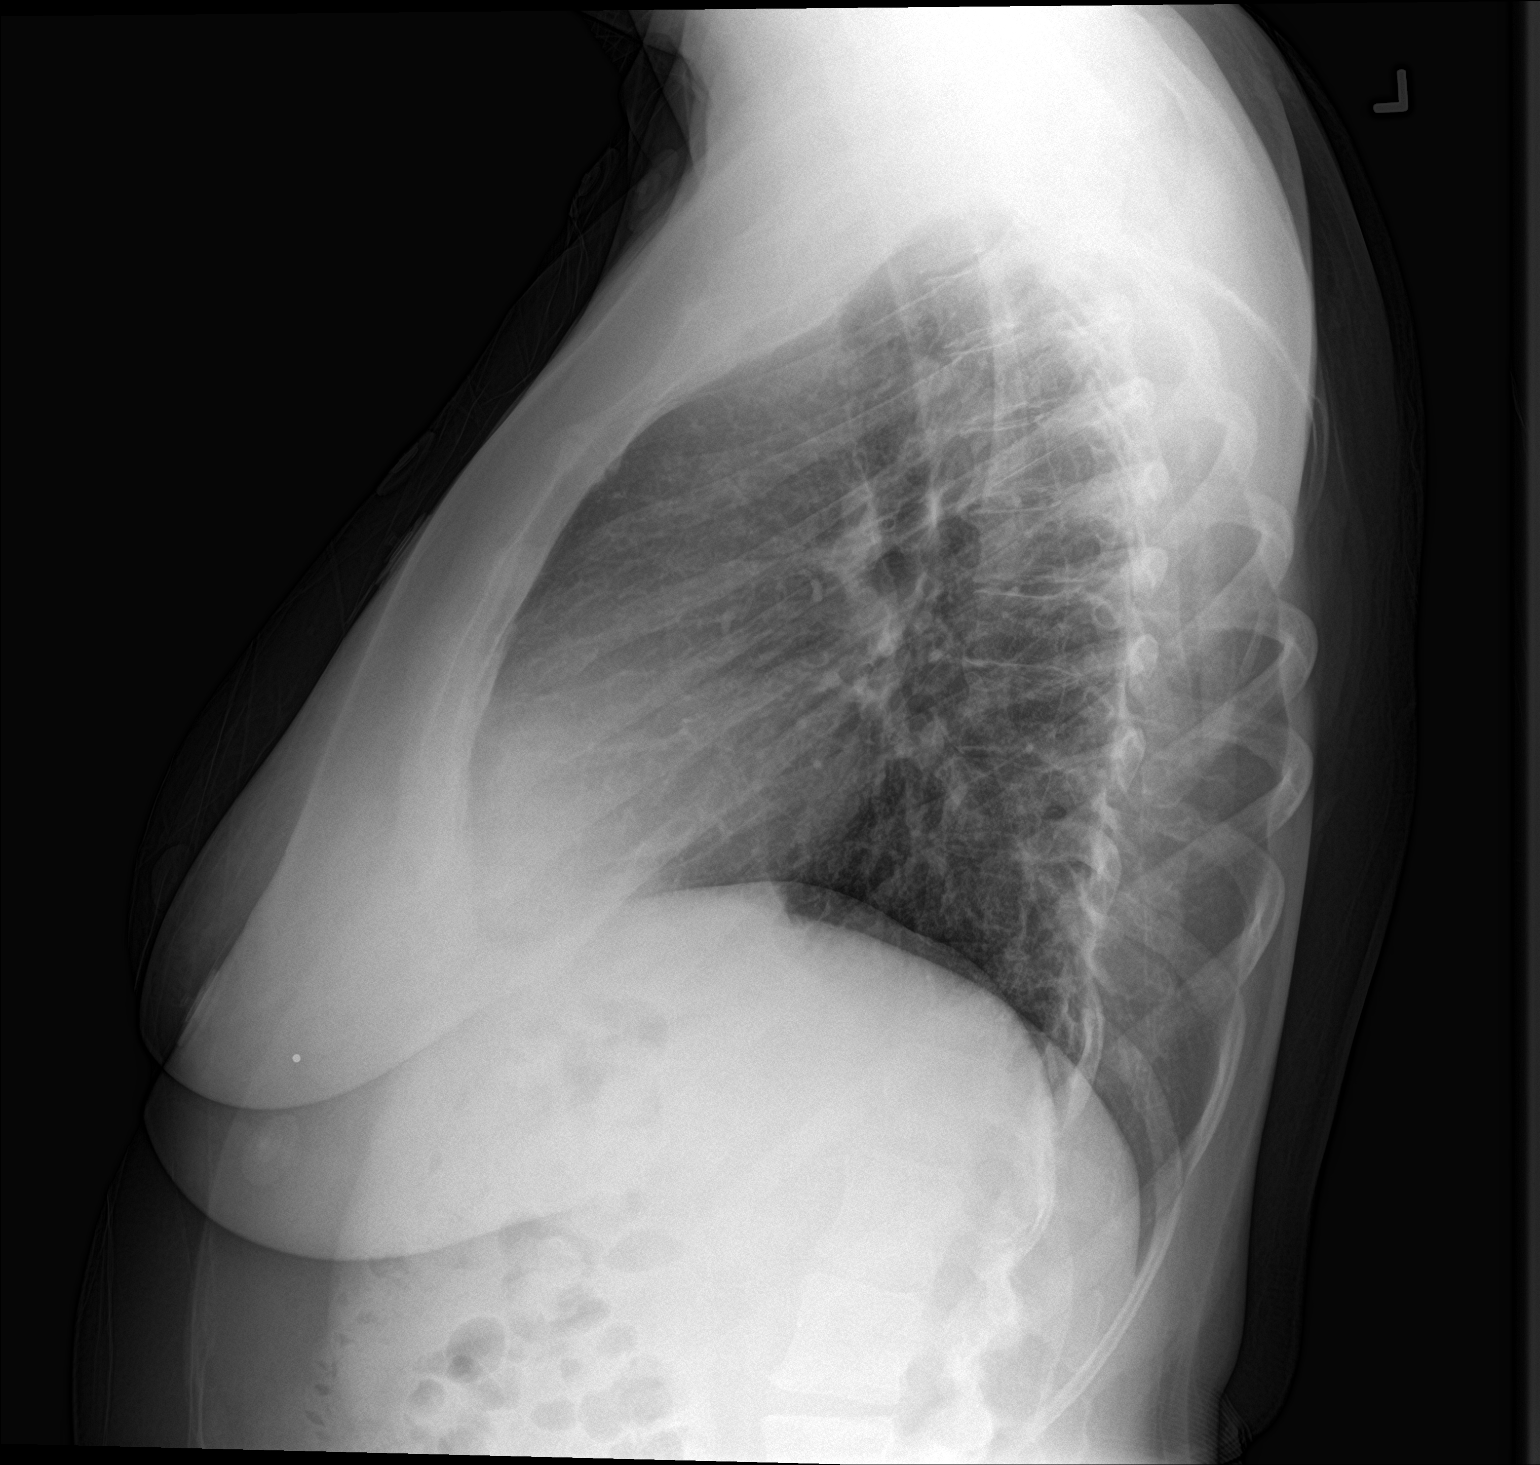

[2 of 2 positions shown; findings below may reference images not displayed]

FINDINGS: The heart size and mediastinal contours are within normal limits.
Both lungs are clear. Mild scoliosis of the spine. Metallic BB over
the left breast shadow
IMPRESSION: No active cardiopulmonary disease.

## 2018-07-28 MED ORDER — SODIUM CHLORIDE 0.9% FLUSH: 3.0000 mL | Freq: Once | INTRAVENOUS | Status: DC

## 2018-07-28 NOTE — ED Triage Notes (Signed)
Pt here with central non radiating chest pain.  Pt states recently treated for bronchitis and still having non productive cough.  Pt states pain comes and goes but sharp when present.  A&Ox4.

## 2018-07-29 MED ORDER — IBUPROFEN 800 MG PO TABS
800.0000 mg | ORAL_TABLET | Freq: Once | ORAL | Status: AC
  Administered 2018-07-29: 800 mg via ORAL
  Filled 2018-07-29: qty 1

## 2018-07-29 NOTE — Discharge Instructions (Addendum)
You may alternate Tylenol 1000 mg every 6 hours as needed for pain and Ibuprofen 800 mg every 8 hours as needed for pain.  Please take Ibuprofen with food. ° °

## 2018-07-29 NOTE — ED Provider Notes (Signed)
TIME SEEN: 4:02 AM  CHIEF COMPLAINT: Chest pain  HPI: Patient is a 48 year old female with history of hypertension, diabetes, rheumatoid arthritis who presents to the emergency department with left-sided chest pain.  Reports last week she was diagnosed with bronchitis.  States she had fevers, chills and cough.  Symptoms slowly improving but still having dry cough.  States that she is having pain over the anterior left lower rib that is worse with palpation, deep inspiration and coughing.  Describes it as a sharp pain.  States she is still feeling short of breath but this is improving as her bronchitis resolves.  No history of PE, DVT, exogenous estrogen use, recent fractures, surgery, trauma, hospitalization or prolonged travel. No lower extremity swelling or pain. No calf tenderness.  She is not a smoker.  States she was concerned she could have fractured a rib.  She is currently on prednisone for her rheumatoid arthritis.   ROS: See HPI Constitutional: no fever  Eyes: no drainage  ENT: no runny nose   Cardiovascular:   chest pain  Resp: no SOB  GI: no vomiting GU: no dysuria Integumentary: no rash  Allergy: no hives  Musculoskeletal: no leg swelling  Neurological: no slurred speech ROS otherwise negative  PAST MEDICAL HISTORY/PAST SURGICAL HISTORY:  Past Medical History:  Diagnosis Date  . Diabetes type 2, controlled ([place])   . Diverticulitis   . GERD (gastroesophageal reflux disease)   . Hypertension   . PONV (postoperative nausea and vomiting)   . SVD (spontaneous vaginal delivery)    x 3  . Umbilical hernia     MEDICATIONS:  Prior to Admission medications   Medication Sig Start Date End Date Taking? Authorizing Provider  calcium carbonate (TUMS - DOSED IN MG ELEMENTAL CALCIUM) 500 MG chewable tablet Chew 2 tablets by mouth daily as needed for indigestion or heartburn. Reported on 08/17/2015    [provider]  CAMILA 0.35 MG tablet Take 1 tablet by mouth daily.  Reported on 08/17/2015 03/22/14   [provider]  hydrocodone-ibuprofen (VICOPROFEN) 5-200 MG per tablet Take 1 tablet by mouth every 6 (six) hours as needed for pain. Patient not taking: Reported on 08/17/2015 04/13/14   Princess Bruins, MD  lansoprazole (PREVACID) 15 MG capsule Take 15 mg by mouth daily at 12 noon.    [provider]  levofloxacin (LEVAQUIN) 500 MG tablet Take 1 tablet (500 mg total) by mouth daily. Patient not taking: Reported on 08/17/2015 09/17/14   Liam Graham, PA-C  losartan-hydrochlorothiazide (HYZAAR) 50-12.5 MG per tablet Take 1 tablet by mouth daily.    [provider]  methocarbamol (ROBAXIN) 500 MG tablet Take 1 tablet (500 mg total) by mouth 2 (two) times daily. 12/23/15   Hyman Bible, PA-C  naproxen (NAPROSYN) 500 MG tablet Take 1 tablet (500 mg total) by mouth 2 (two) times daily. 12/23/15   Hyman Bible, PA-C  phentermine (ADIPEX-P) 37.5 MG tablet Take 18.75 mg by mouth daily before breakfast.    [provider]  phentermine 37.5 MG capsule Take 37.5 mg by mouth every morning. Reported on 08/17/2015    [provider]  promethazine (PHENERGAN) 25 MG tablet Take 1 tablet (25 mg total) by mouth every 6 (six) hours as needed for nausea or vomiting. Patient not taking: Reported on 08/17/2015 04/13/14   Princess Bruins, MD  ranitidine (ZANTAC) 150 MG tablet Take 150 mg by mouth daily. Reported on 08/17/2015    [provider]  traMADol (ULTRAM) 50 MG  tablet Take 1 tablet (50 mg total) by mouth every 6 (six) hours as needed. 12/23/15   Hyman Bible, PA-C    ALLERGIES:  No Known Allergies  SOCIAL HISTORY:  Social History   Tobacco Use  . Smoking status: Never Smoker  . Smokeless tobacco: Never Used  Substance Use Topics  . Alcohol use: No    FAMILY HISTORY: History reviewed. No pertinent family history.  EXAM: BP (!) 150/102 (BP Location: Right Arm)   Pulse 91   Temp 98.1 F (36.7 C)  (Oral)   Resp 20   SpO2 99%  CONSTITUTIONAL: Alert and oriented and responds appropriately to questions. Well-appearing; well-nourished HEAD: Normocephalic EYES: Conjunctivae clear, pupils appear equal, EOMI ENT: normal nose; moist mucous membranes NECK: Supple, no meningismus, no nuchal rigidity, no LAD  CARD: RRR; S1 and S2 appreciated; no murmurs, no clicks, no rubs, no gallops CHEST:  Chest wall is tender to palpation over one spot over the lower anterior left ribs.  No crepitus, ecchymosis, erythema, warmth, rash or other lesions present.   RESP: Normal chest excursion without splinting or tachypnea; breath sounds clear and equal bilaterally; no wheezes, no rhonchi, no rales, no hypoxia or respiratory distress, speaking full sentences ABD/GI: Normal bowel sounds; non-distended; soft, non-tender, no rebound, no guarding, no peritoneal signs, no hepatosplenomegaly BACK:  The back appears normal and is non-tender to palpation, there is no CVA tenderness EXT: Normal ROM in all joints; non-tender to palpation; no edema; normal capillary refill; no cyanosis, no calf tenderness or swelling    SKIN: Normal color for age and race; warm; no rash NEURO: Moves all extremities equally PSYCH: The patient's mood and manner are appropriate. Grooming and personal hygiene are appropriate.  MEDICAL DECISION MAKING: Patient here with atypical chest pain.  Seems to be musculoskeletal in nature.  She is PERC negative.  Doubt ACS.  Troponin is normal and pain has been ongoing for several days.  Chest x-ray clear and shows no rib fracture, pneumothorax, edema or pneumonia.  Patient feels very reassured.  Recommend alternating Tylenol and Motrin for pain.  Do not feel she needs further emergent work-up at this time and can be discharged.  Labs otherwise unremarkable other than mild leukocytosis of 13,000 but this may be secondary to recent prednisone use.  At this time, I do not feel there is any life-threatening  condition present. I have reviewed and discussed all results (EKG, imaging, lab, urine as appropriate) and exam findings with patient/family. I have reviewed nursing notes and appropriate previous records.  I feel the patient is safe to be discharged home without further emergent workup and can continue workup as an outpatient as needed. Discussed usual and customary return precautions. Patient/family verbalize understanding and are comfortable with this plan.  Outpatient follow-up has been provided as needed. All questions have been answered.      EKG Interpretation  Date/Time:  Monday July 28 2018 22:39:18 EST Ventricular Rate:  83 PR Interval:  148 QRS Duration: 82 QT Interval:  368 QTC Calculation: 432 R Axis:   48 Text Interpretation:  Normal sinus rhythm Cannot rule out Anterior infarct , age undetermined Abnormal ECG No significant change since last tracing other than rate is slower Confirmed by Winnona Wargo, Cyril Mourning [phone]) on 07/29/2018 3:57:19 AM         Emoni Whitworth, Delice Bison, DO 07/29/18 1829

## 2018-08-18 ENCOUNTER — Ambulatory Visit: Payer: No Typology Code available for payment source

## 2018-09-17 ENCOUNTER — Ambulatory Visit: Payer: No Typology Code available for payment source

## 2018-11-06 ENCOUNTER — Ambulatory Visit
Admission: RE | Admit: 2018-11-06 | Discharge: 2018-11-06 | Disposition: A | Discharge status: Home / Self Care | Payer: Commercial Managed Care - PPO | Source: Ambulatory Visit | Attending: Obstetrics and Gynecology | Admitting: Obstetrics and Gynecology

## 2018-11-06 ENCOUNTER — Other Ambulatory Visit: Payer: Self-pay

## 2018-11-06 DIAGNOSIS — Z1231 Encounter for screening mammogram for malignant neoplasm of breast: Secondary | ICD-10-CM

## 2018-11-06 IMAGING — MG DIGITAL SCREENING BILATERAL MAMMOGRAM WITH TOMO AND CAD
8 series · 8 of 24 positions shown · non-contrast
Comparison: Previous exam(s).

CLINICAL DATA: Screening.

EXAM:
DIGITAL SCREENING BILATERAL MAMMOGRAM WITH TOMO AND CAD

[R CC synth-2D]
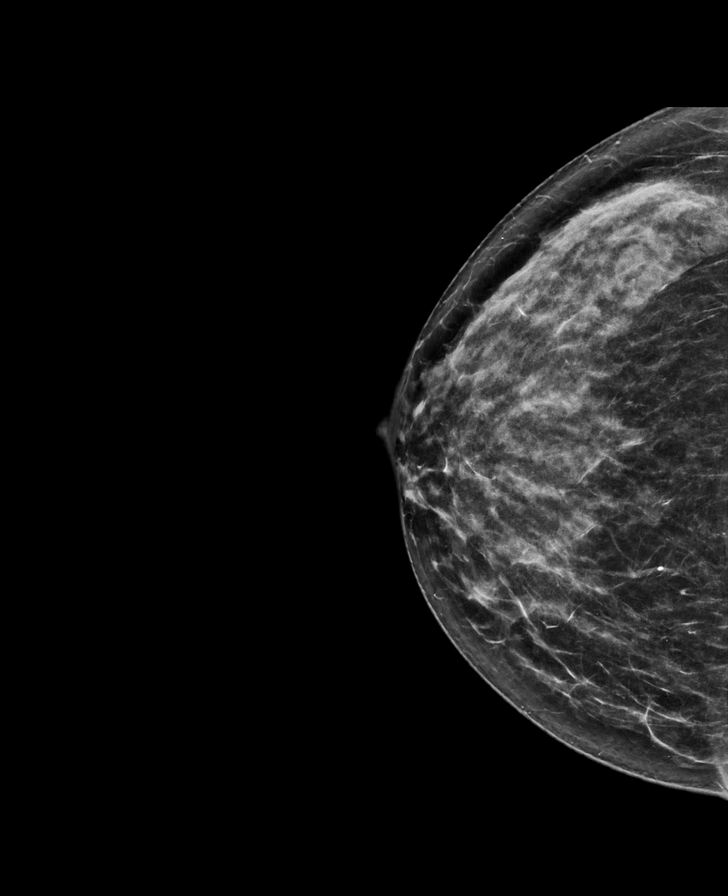

[L CC synth-2D]
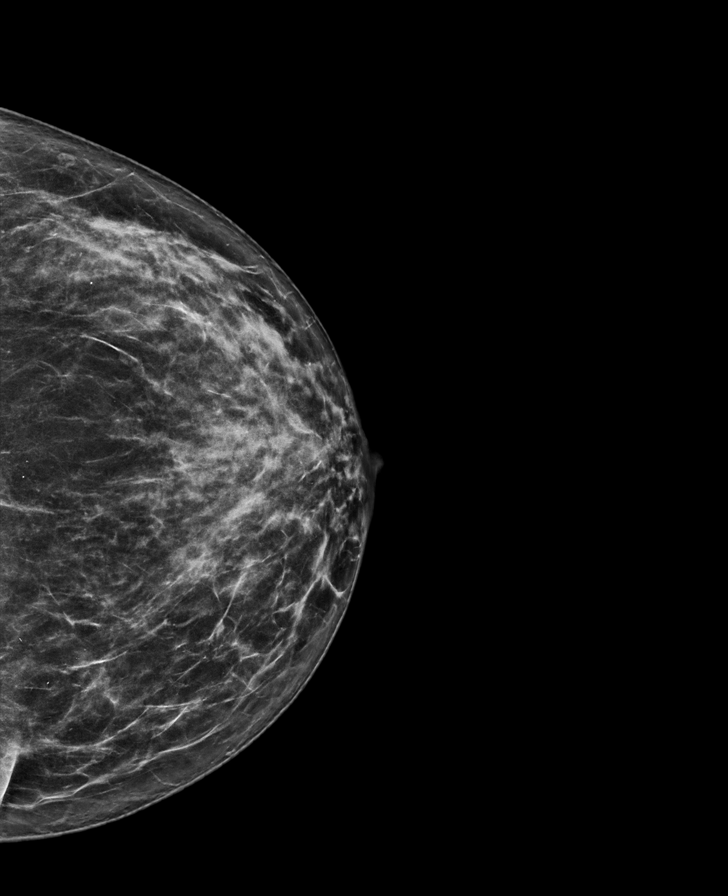

[L MLO synth-2D]
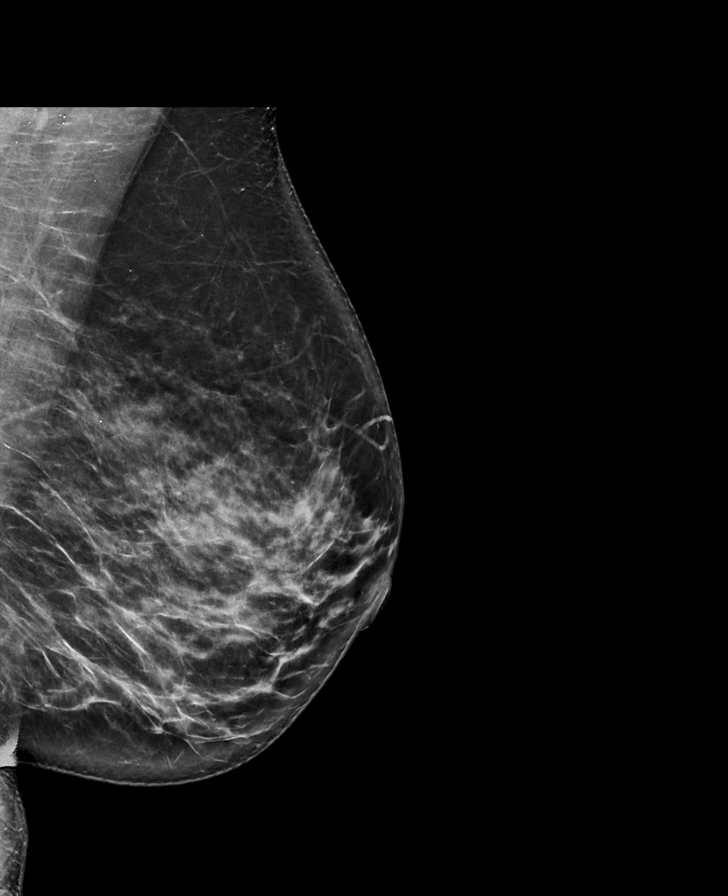

[R MLO synth-2D]
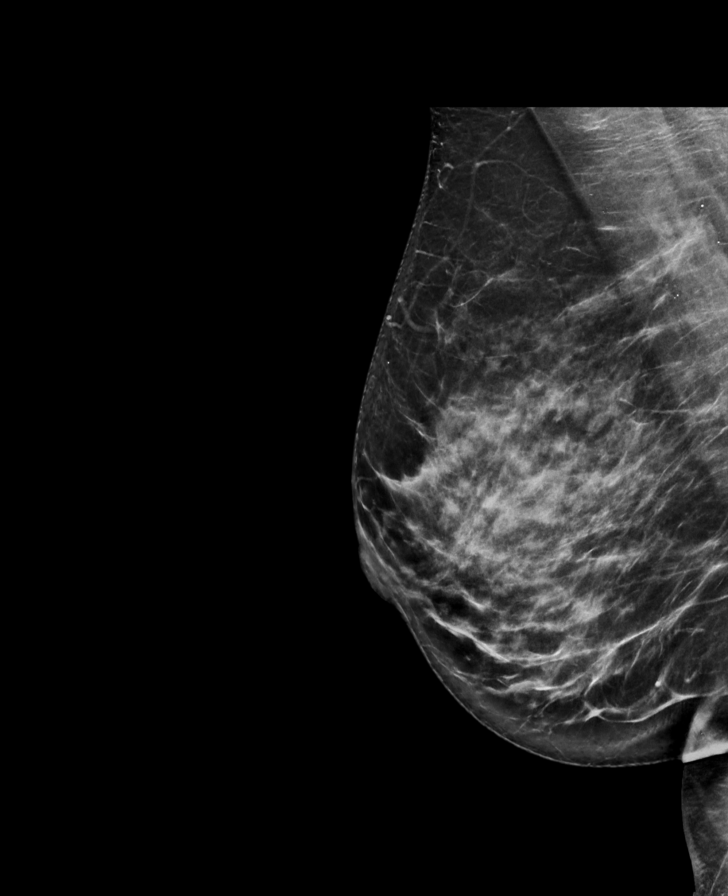

[R MLO tomo · tomo slice 41/82.0]
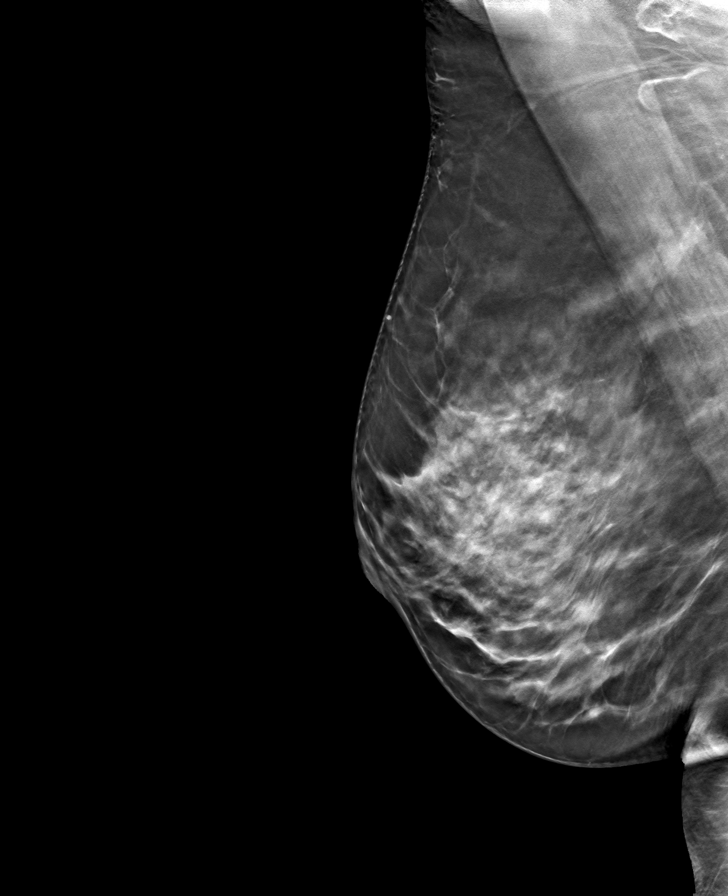

[R CC tomo · tomo slice 37/72.0]
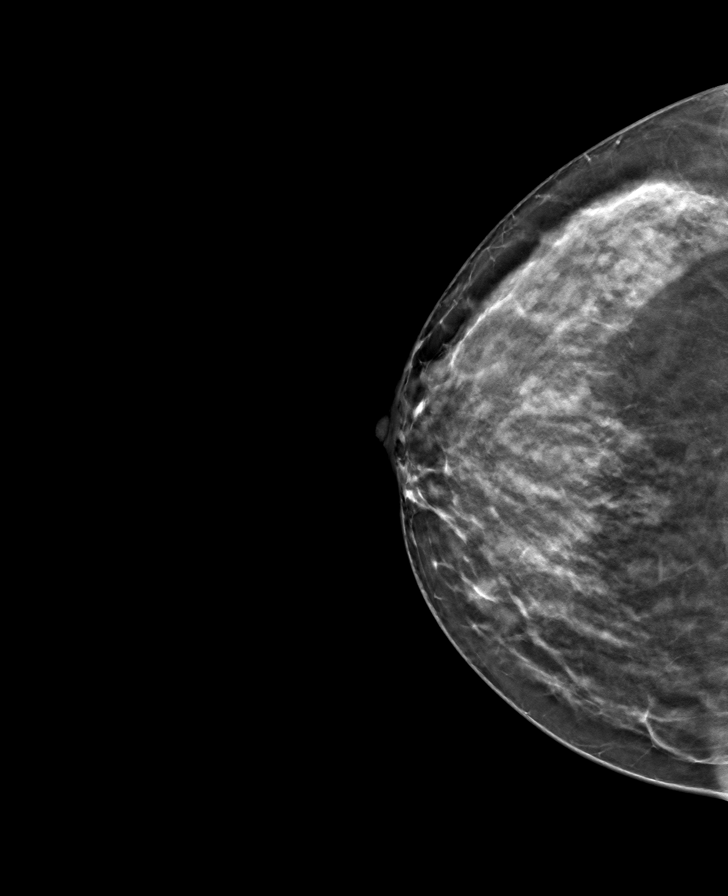

[L CC tomo · tomo slice 38/75.0]
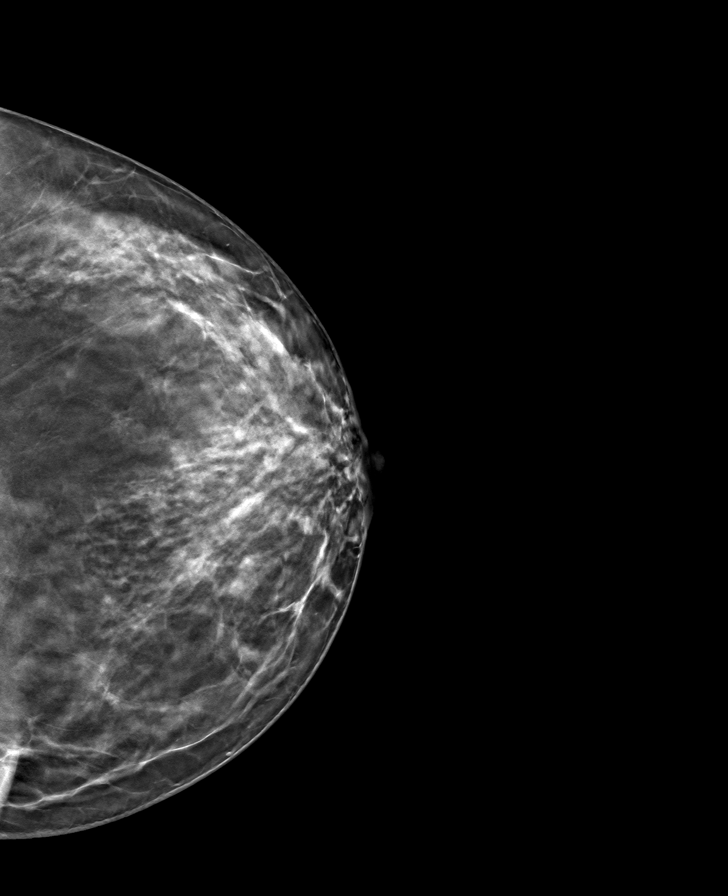

[L MLO tomo · tomo slice 44/87.0]
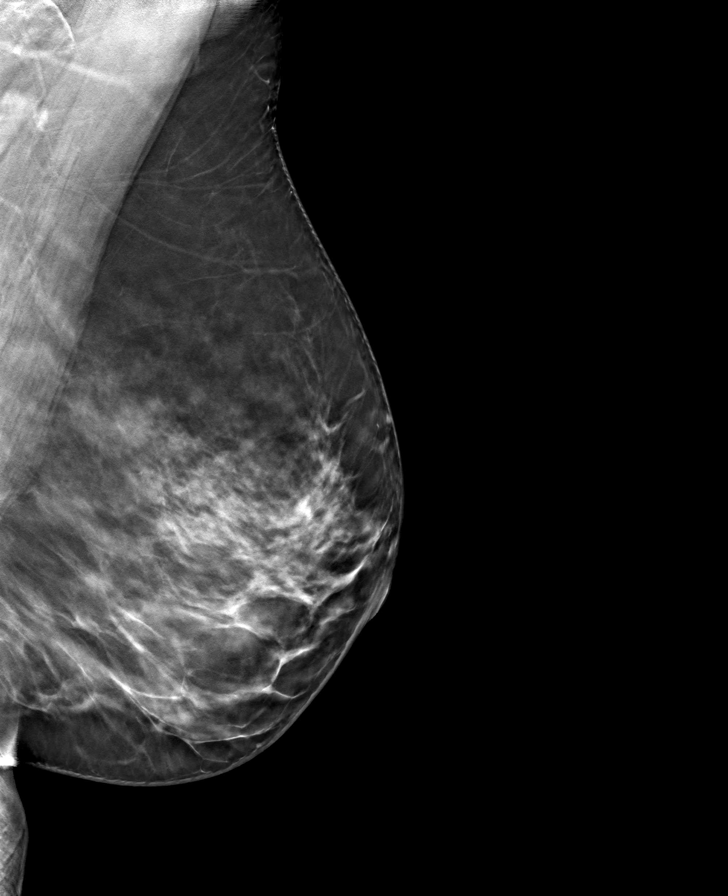

[8 of 24 positions shown; findings below may reference images not displayed]

ACR Breast Density Category c: The breast tissue is heterogeneously
dense, which may obscure small masses.
FINDINGS: There are no findings suspicious for malignancy. Images were
processed with CAD.
IMPRESSION: No mammographic evidence of malignancy. A result letter of this
screening mammogram will be mailed directly to the patient.

RECOMMENDATION:
Screening mammogram in one year. (Code:[id number]-U-LHB)

BI-RADS CATEGORY  1: Negative.

## 2019-01-26 NOTE — Progress Notes (Addendum)
Cardiology Office Note:    Date:  01/27/2019   ID:  Lisa Novak, DOB 1970/06/29, MRN [id number]  PCP:  Michel Harrow, PA-C  Cardiologist:  Shirlee More, MD   Referring MD: Michel Harrow, PA-C  ASSESSMENT:    1. SOB (shortness of breath) on exertion   2. Chest pain of uncertain etiology   3. Type 2 diabetes mellitus without complication, without long-term current use of insulin ([place])   4. Essential hypertension    PLAN:    In order of problems listed above:  1. Clinical presentation of edema shortness of breath exertional shortness of breath orthopnea and PND points to congestive heart failure in the setting of hypertension and rheumatoid arthritis treated with biologic agents.  Today we will check labs including proBNP and a d-dimer echocardiogram as quickly as possible and if confirmed start diuretic therapy.  She inquires about a trip to [place] on Friday and I told her I need to proceed the results of the echocardiogram and will do it as quickly as possible.  For now continue her current antihypertensive ARB thiazide if EF is reduced I will transition her to [facility].  Her biologic agent has been discontinued by rheumatology.  Differential diagnosis includes lung disease related to rheumatoid arthritis check chest x-ray but not present on previous CT scan of the chest and check a d-dimer to screen for venous thromboembolism.  Next appointment 2 weeks   Medication Adjustments/Labs and Tests Ordered: Current medicines are reviewed at length with the patient today.  Concerns regarding medicines are outlined above.  Orders Placed This Encounter  Procedures  . EKG 12-Lead  . ECHOCARDIOGRAM COMPLETE   No orders of the defined types were placed in this encounter.    Chief Complaint  Patient presents with  . OTHER    Sob. Meds reviewed verbally with pt.  . Shortness of Breath    History of Present Illness:    Lisa Novak is a 48 y.o. female with hypertension,  diabetes, rheumatoid arthritis who is being seen today for the evaluation of chest pain and exertional SOB  at the request of Michel Harrow, PA-C.  Her COVID-19 testing in July was normal.  CTA chest 09/12/2018: This is an addendum the CT scan is from 2018 when she was seen in emergency room with shortness of breath and d-dimer was markedly elevated. IMPRESSION: 1. Suboptimal opacification of distal most arterial branches which are also obscured by motion artifact. No definite acute pulmonary embolus is visualized. 2. Clear lung fields  For several months she has had shortness of breath that is progressed to become severe.  She is short of breath with any activity walking in the home ADLs and she has trouble with orthopnea and PND.  At night when she is short of breath supine she coughs and wheezes.  She was seen in the emergency room in 2018 for SOB.She is unimproved and anything is worse and had an episode last week where she almost went to the emergency room.  She has no known lung disease and has rheumatoid arthritis which presently is not well controlled and is receiving biologic agents including Humira.  She said she has had extensive lab work done through rheumatology.  No chest pain but says she is aware of her heart being rapid rates in the 120s walking indoors up to 160 doing activities but not particularly irregular and it seems to be associated with her shortness of breath.  She has no known heart  disease congenital or rheumatic.  Her other significant illnesses include hypertension.  She is having weight gain of 10 to 15 pounds in the last year he notices edema at times. Past Medical History:  Diagnosis Date  . Diabetes type 2, controlled ([place])   . Diverticulitis   . GERD (gastroesophageal reflux disease)   . Hypertension   . PONV (postoperative nausea and vomiting)   . SVD (spontaneous vaginal delivery)    x 3  . Umbilical hernia     Past Surgical History:  Procedure  Laterality Date  . ABDOMINAL HYSTERECTOMY    . ABDOMINOPLASTY    . APPENDECTOMY    . HERNIA REPAIR    . LAPAROSCOPIC APPENDECTOMY N/A 02/11/2014   Procedure: APPENDECTOMY LAPAROSCOPIC;  Surgeon: Autumn Messing III, MD;  Location: [facility];  Service: General;  Laterality: N/A;  . ROBOTIC ASSISTED LAPAROSCOPIC OVARIAN CYSTECTOMY Left 04/13/2014   Procedure: ROBOTIC ASSISTED LAPAROSCOPIC Left OVARIAN CYSTECTOMY, BILATERAL SALPINGECTOMY,lysis of adhesions;  Surgeon: Princess Bruins, MD;  Location: [facility];  Service: Gynecology;  Laterality: Left;  . WISDOM TOOTH EXTRACTION      Current Medications: Current Meds  Medication Sig  . abatacept (ORENCIA) 250 MG injection Inject into the vein once a week.  . calcium carbonate (TUMS - DOSED IN MG ELEMENTAL CALCIUM) 500 MG chewable tablet Chew 2 tablets by mouth daily as needed for indigestion or heartburn. Reported on 08/17/2015  . CAMILA 0.35 MG tablet Take 1 tablet by mouth daily. Reported on 08/17/2015  . FOLIC ACID PO Take by mouth. 2 TABLE QD.  . lansoprazole (PREVACID) 15 MG capsule Take 15 mg by mouth daily at 12 noon.  Marland Kitchen losartan-hydrochlorothiazide (HYZAAR) 50-12.5 MG per tablet Take 1 tablet by mouth daily.  . methotrexate (RHEUMATREX) 2.5 MG tablet Takes 6 tablets weekly.  Marland Kitchen spironolactone (ALDACTONE) 100 MG tablet daily.     Allergies:   Patient has no known allergies.   Social History   Socioeconomic History  . Marital status: Married    Spouse name: Not on file  . Number of children: Not on file  . Years of education: Not on file  . Highest education level: Not on file  Occupational History  . Not on file  Social Needs  . Financial resource strain: Not on file  . Food insecurity    Worry: Not on file    Inability: Not on file  . Transportation needs    Medical: Not on file    Non-medical: Not on file  Tobacco Use  . Smoking status: Never Smoker  . Smokeless tobacco: Never Used  Substance and Sexual Activity  . Alcohol use:  No  . Drug use: No  . Sexual activity: Yes    Birth control/protection: Condom, Surgical    Comment: Hysterectony  Lifestyle  . Physical activity    Days per week: Not on file    Minutes per session: Not on file  . Stress: Not on file  Relationships  . Social Herbalist on phone: Not on file    Gets together: Not on file    Attends religious service: Not on file    Active member of club or organization: Not on file    Attends meetings of clubs or organizations: Not on file    Relationship status: Not on file  Other Topics Concern  . Not on file  Social History Narrative  . Not on file     Family History: The patient's family history includes  COPD in her mother.  ROS:   Review of Systems  Constitution: Positive for malaise/fatigue and weight gain.  HENT: Negative.   Eyes: Negative.   Cardiovascular: Positive for dyspnea on exertion, orthopnea, palpitations and paroxysmal nocturnal dyspnea.  Respiratory: Positive for cough, shortness of breath and wheezing.   Endocrine: Negative.   Hematologic/Lymphatic: Negative.   Skin: Negative.   Musculoskeletal: Positive for joint pain.  Gastrointestinal: Negative.   Genitourinary: Negative.   Neurological: Negative.   Psychiatric/Behavioral: The patient is nervous/anxious.   Allergic/Immunologic: Negative.    Please see the history of present illness.     All other systems reviewed and are negative.  EKGs/Labs/Other Studies Reviewed:    The following studies were reviewed today:   EKG:  EKG is  ordered today.  The ekg ordered today is personally reviewed and demonstrates sinus rhythm 90 bpm otherwise normal  Recent Labs: 07/28/2018: BUN 10; Creatinine, Ser 0.82; Hemoglobin 13.8; Platelets 264; Potassium 3.1; Sodium 136  Recent Lipid Panel No results found for: CHOL, TRIG, HDL, CHOLHDL, VLDL, LDLCALC, LDLDIRECT  Physical Exam:    VS:  BP 110/86 (BP Location: Left Arm, Patient Position: Sitting, Cuff Size: Normal)    Pulse 90   Ht 5' 4.5" (1.638 m)   Wt 176 lb 4 oz (79.9 kg)   SpO2 98%   BMI 29.79 kg/m     Wt Readings from Last 3 Encounters:  01/27/19 176 lb 4 oz (79.9 kg)  10/01/16 170 lb (77.1 kg)  12/23/15 158 lb (71.7 kg)     GEN:  Well nourished, well developed in no acute distress HEENT: Normal NECK: No JVD; No carotid bruits LYMPHATICS: No lymphadenopathy CARDIAC: Soft S1 RRR, no murmurs, rubs, gallops RESPIRATORY:  Clear to auscultation without rales, wheezing or rhonchi  ABDOMEN: Soft, non-tender, non-distended MUSCULOSKELETAL: 1+ bilateral to the knee pitting bilateral edema; No deformity  SKIN: Warm and dry NEUROLOGIC:  Alert and oriented x 3 PSYCHIATRIC:  Normal affect     Signed, Shirlee More, MD  01/27/2019 10:55 AM    [place]

## 2019-01-27 ENCOUNTER — Ambulatory Visit (HOSPITAL_BASED_OUTPATIENT_CLINIC_OR_DEPARTMENT_OTHER)
Admission: RE | Admit: 2019-01-27 | Discharge: 2019-01-27 | Disposition: A | Discharge status: Home / Self Care | Payer: Commercial Managed Care - PPO | Source: Ambulatory Visit | Attending: Cardiology | Admitting: Cardiology

## 2019-01-27 ENCOUNTER — Ambulatory Visit (INDEPENDENT_AMBULATORY_CARE_PROVIDER_SITE_OTHER): Payer: Commercial Managed Care - PPO | Admitting: Cardiology

## 2019-01-27 ENCOUNTER — Other Ambulatory Visit: Payer: Self-pay

## 2019-01-27 ENCOUNTER — Encounter: Payer: Self-pay | Admitting: Cardiology

## 2019-01-27 VITALS — BP 110/86 | HR 90 | Ht 64.5 in | Wt 176.2 lb

## 2019-01-27 DIAGNOSIS — R0602 Shortness of breath: Secondary | ICD-10-CM

## 2019-01-27 DIAGNOSIS — R079 Chest pain, unspecified: Secondary | ICD-10-CM

## 2019-01-27 DIAGNOSIS — I10 Essential (primary) hypertension: Secondary | ICD-10-CM | POA: Insufficient documentation

## 2019-01-27 DIAGNOSIS — E119 Type 2 diabetes mellitus without complications: Secondary | ICD-10-CM

## 2019-01-27 DIAGNOSIS — R072 Precordial pain: Secondary | ICD-10-CM

## 2019-01-27 DIAGNOSIS — R0789 Other chest pain: Secondary | ICD-10-CM | POA: Insufficient documentation

## 2019-01-27 LAB — ECHOCARDIOGRAM COMPLETE
Height: 64.5 in
Weight: 2820 oz

## 2019-01-27 IMAGING — DX DG CHEST 2V
2 series · 2 of 2 positions shown · non-contrast
Comparison: 07/28/2018

CLINICAL DATA: Shortness of breath and chest pain

EXAM:
CHEST - 2 VIEW

[chest pa]
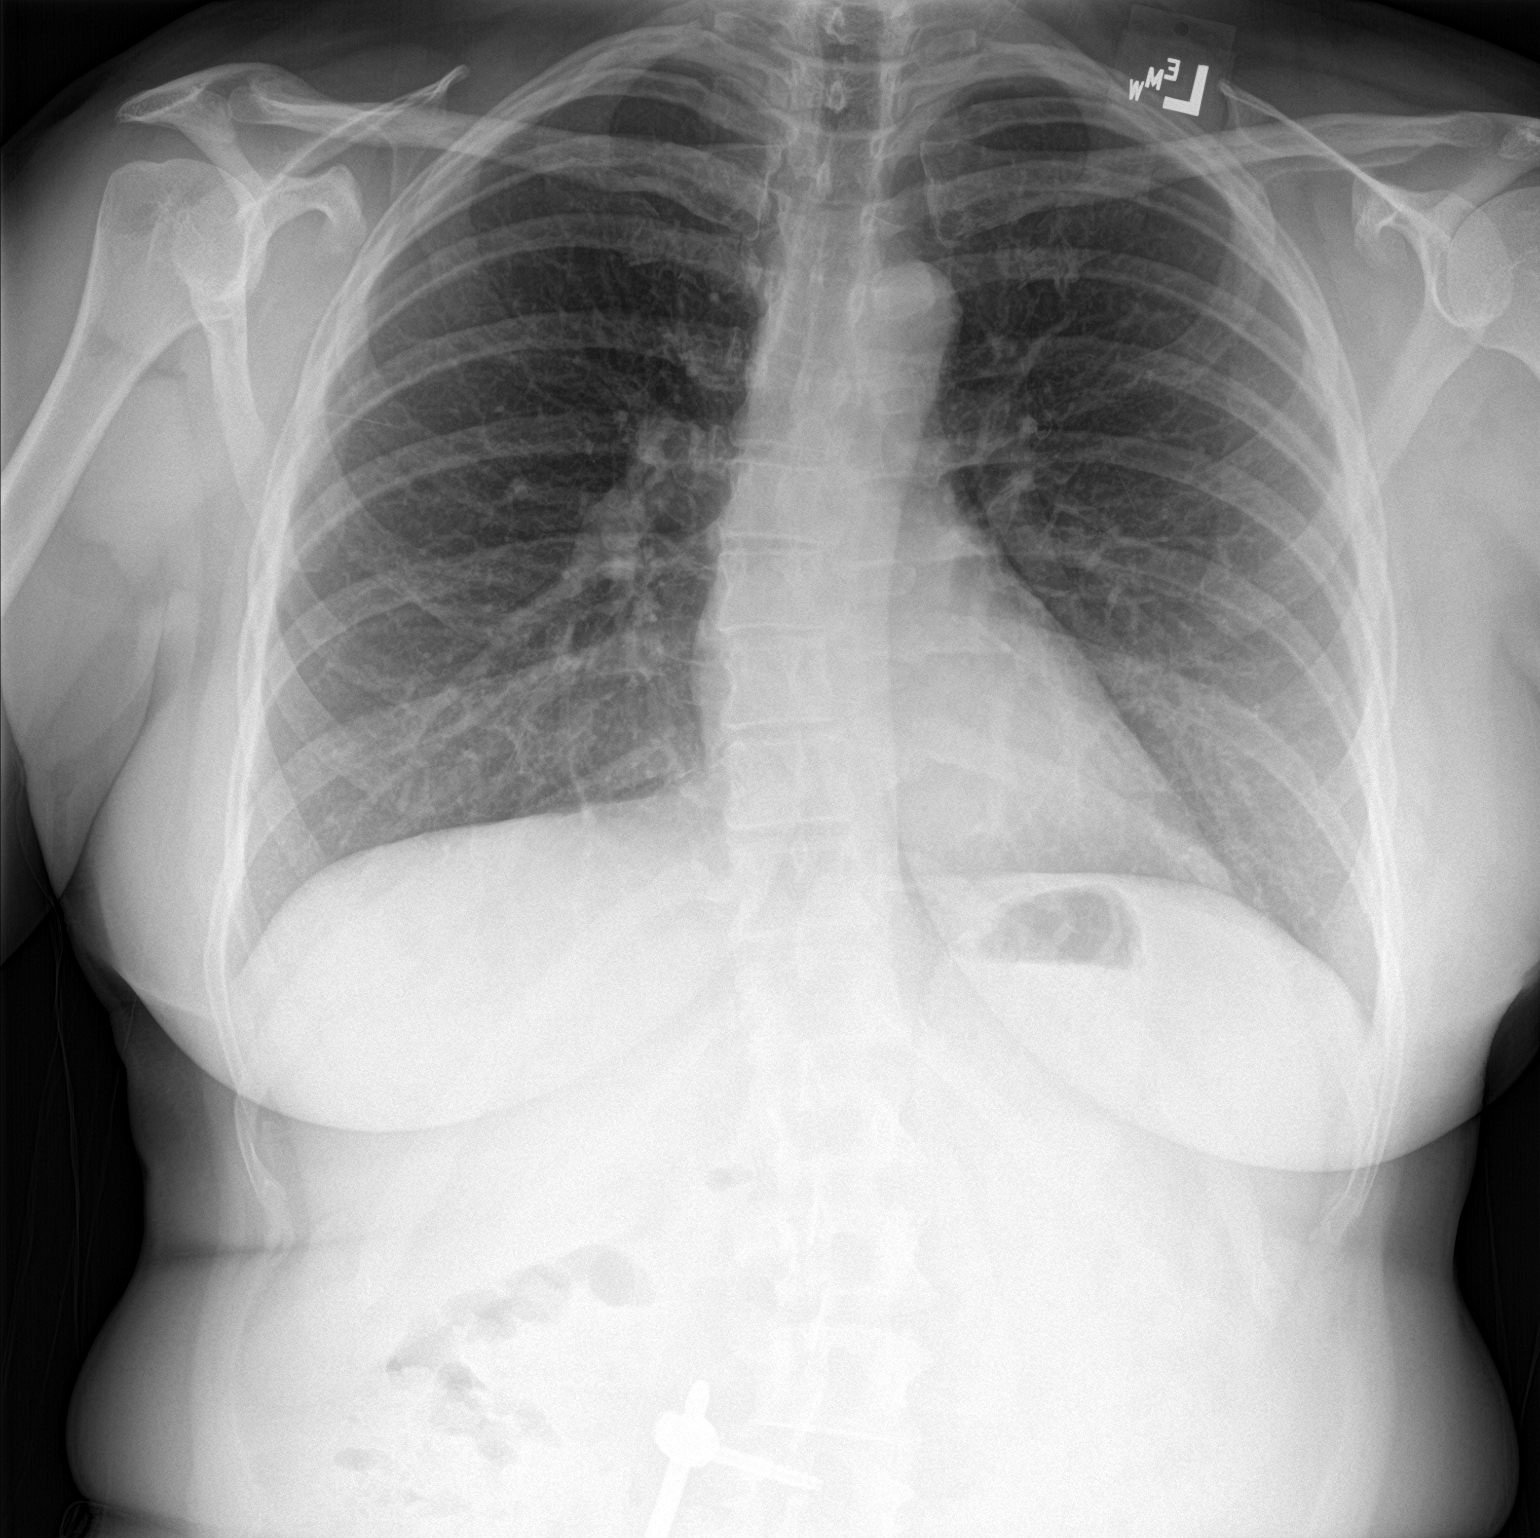

[chest lat]
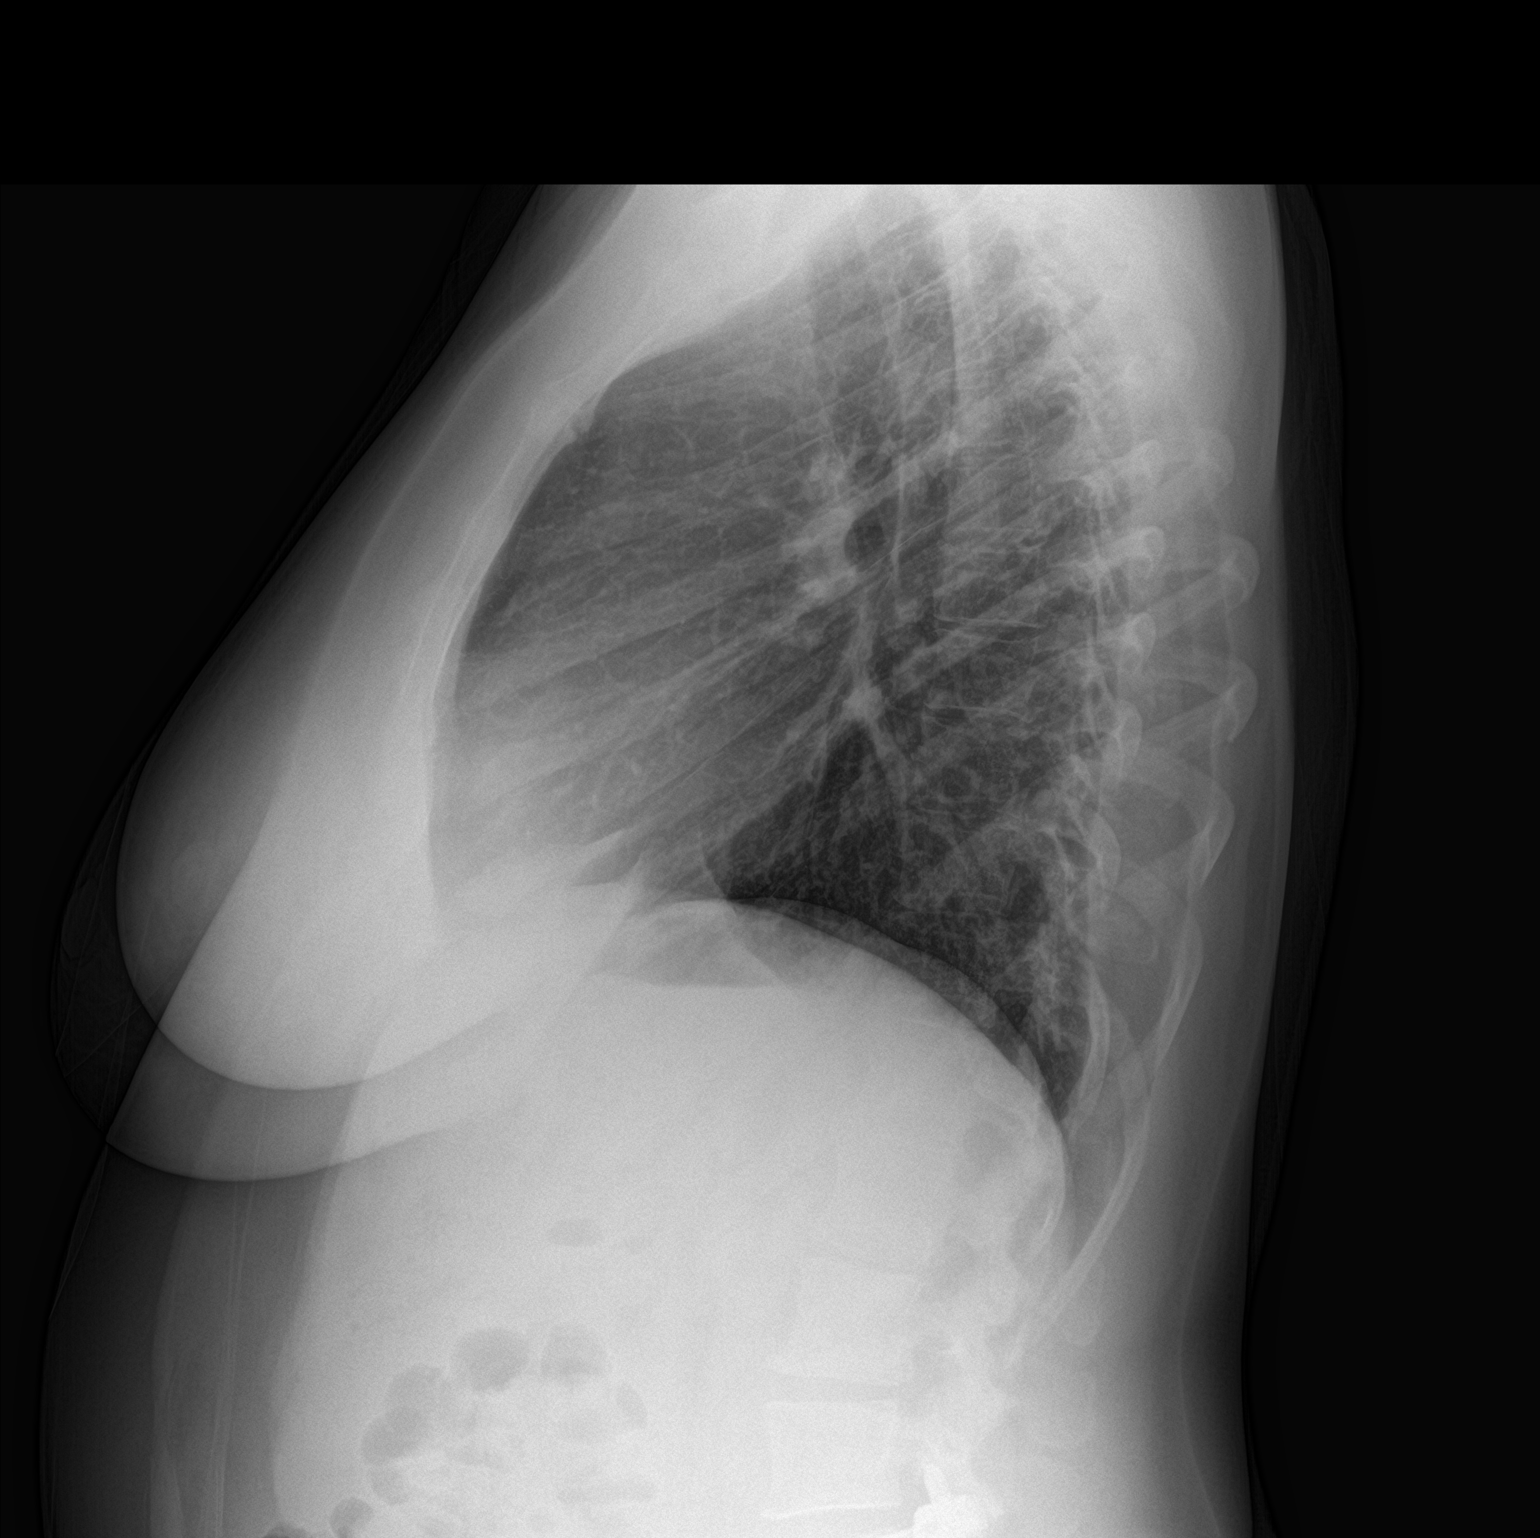

[2 of 2 positions shown; findings below may reference images not displayed]

FINDINGS: The heart size and mediastinal contours are within normal limits.
Both lungs are clear. Scoliosis of the spine.
IMPRESSION: No active cardiopulmonary disease.

## 2019-01-27 NOTE — Progress Notes (Signed)
  Echocardiogram 2D Echocardiogram has been performed.  Cardell Peach 01/27/2019, 4:21 PM

## 2019-01-27 NOTE — Patient Instructions (Signed)
Medication Instructions:  Your physician recommends that you continue on your current medications as directed. Please refer to the Current Medication list given to you today.  If you need a refill on your cardiac medications before your next appointment, please call your pharmacy.   Lab work: Your physician recommends that you return for lab work today: CBC, BMP, STAT ProBNP, STAT troponin I, STAT d-dimer.   If you have labs (blood work) drawn today and your tests are completely normal, you will receive your results only by: Marland Kitchen MyChart Message (if you have MyChart) OR . A paper copy in the mail If you have any lab test that is abnormal or we need to change your treatment, we will call you to review the results.  Testing/Procedures: You had an EKG today.   A chest x-ray takes a picture of the organs and structures inside the chest, including the heart, lungs, and blood vessels. This test can show several things, including, whether the heart is enlarges; whether fluid is building up in the lungs; and whether pacemaker / defibrillator leads are still in place.  Your physician has requested that you have an echocardiogram. Echocardiography is a painless test that uses sound waves to create images of your heart. It provides your doctor with information about the size and shape of your heart and how well your heart's chambers and valves are working. This procedure takes approximately one hour. There are no restrictions for this procedure.  Follow-Up: At [facility], you and your health needs are our priority.  As part of our continuing mission to provide you with exceptional heart care, we have created designated Provider Care Teams.  These Care Teams include your primary Cardiologist (physician) and Advanced Practice Providers (APPs -  Physician Assistants and Nurse Practitioners) who all work together to provide you with the care you need, when you need it. You will need a follow up appointment  in 2 weeks.       Echocardiogram An echocardiogram is a procedure that uses painless sound waves (ultrasound) to produce an image of the heart. Images from an echocardiogram can provide important information about:  Signs of coronary artery disease (CAD).  Aneurysm detection. An aneurysm is a weak or damaged part of an artery wall that bulges out from the normal force of blood pumping through the body.  Heart size and shape. Changes in the size or shape of the heart can be associated with certain conditions, including heart failure, aneurysm, and CAD.  Heart muscle function.  Heart valve function.  Signs of a past heart attack.  Fluid buildup around the heart.  Thickening of the heart muscle.  A tumor or infectious growth around the heart valves. Tell a health care provider about:  Any allergies you have.  All medicines you are taking, including vitamins, herbs, eye drops, creams, and over-the-counter medicines.  Any blood disorders you have.  Any surgeries you have had.  Any medical conditions you have.  Whether you are pregnant or may be pregnant. What are the risks? Generally, this is a safe procedure. However, problems may occur, including:  Allergic reaction to dye (contrast) that may be used during the procedure. What happens before the procedure? No specific preparation is needed. You may eat and drink normally. What happens during the procedure?   An IV tube may be inserted into one of your veins.  You may receive contrast through this tube. A contrast is an injection that improves the quality of the pictures  from your heart.  A gel will be applied to your chest.  A wand-like tool (transducer) will be moved over your chest. The gel will help to transmit the sound waves from the transducer.  The sound waves will harmlessly bounce off of your heart to allow the heart images to be captured in real-time motion. The images will be recorded on a computer. The  procedure may vary among health care providers and hospitals. What happens after the procedure?  You may return to your normal, everyday life, including diet, activities, and medicines, unless your health care provider tells you not to do that. Summary  An echocardiogram is a procedure that uses painless sound waves (ultrasound) to produce an image of the heart.  Images from an echocardiogram can provide important information about the size and shape of your heart, heart muscle function, heart valve function, and fluid buildup around your heart.  You do not need to do anything to prepare before this procedure. You may eat and drink normally.  After the echocardiogram is completed, you may return to your normal, everyday life, unless your health care provider tells you not to do that. This information is not intended to replace advice given to you by your health care provider. Make sure you discuss any questions you have with your health care provider. Document Released: 05/11/2000 Document Revised: 09/04/2018 Document Reviewed: 06/16/2016 Elsevier Patient Education  2020 [employer].

## 2019-01-28 ENCOUNTER — Ambulatory Visit (HOSPITAL_COMMUNITY)
Admission: RE | Admit: 2019-01-28 | Discharge: 2019-01-28 | Disposition: A | Discharge status: Home / Self Care | Payer: Commercial Managed Care - PPO | Source: Ambulatory Visit | Attending: Cardiology | Admitting: Cardiology

## 2019-01-28 ENCOUNTER — Telehealth: Payer: Self-pay | Admitting: *Deleted

## 2019-01-28 DIAGNOSIS — R0602 Shortness of breath: Secondary | ICD-10-CM | POA: Diagnosis not present

## 2019-01-28 LAB — CBC
Hematocrit: 42.1 % (ref 34.0–46.6)
Hemoglobin: 14.5 g/dL (ref 11.1–15.9)
MCH: 30.8 pg (ref 26.6–33.0)
MCHC: 34.4 g/dL (ref 31.5–35.7)
MCV: 89 fL (ref 79–97)
Platelets: 280 10*3/uL (ref 150–450)
RBC: 4.71 x10E6/uL (ref 3.77–5.28)
RDW: 13.5 % (ref 11.7–15.4)
WBC: 9.1 10*3/uL (ref 3.4–10.8)

## 2019-01-28 LAB — TROPONIN I: Troponin I: <0.01 ng/mL (ref 0.00–0.04)

## 2019-01-28 LAB — BASIC METABOLIC PANEL
BUN/Creatinine Ratio: 15 (ref 9–23)
BUN: 14 mg/dL (ref 6–24)
CO2: 22 mmol/L (ref 20–29)
Calcium: 10 mg/dL (ref 8.7–10.2)
Chloride: 99 mmol/L (ref 96–106)
Creatinine, Ser: 0.91 mg/dL (ref 0.57–1.00)
GFR calc Af Amer: 86 mL/min/{1.73_m2} (ref >59)
GFR calc non Af Amer: 75 mL/min/{1.73_m2} (ref >59)
Glucose: 105 mg/dL — ABNORMAL HIGH (ref 65–99)
Potassium: 3.9 mmol/L (ref 3.5–5.2)
Sodium: 138 mmol/L (ref 134–144)

## 2019-01-28 LAB — D-DIMER, QUANTITATIVE (NOT AT ARMC): D-DIMER: 0.4 mg/L FEU (ref 0.00–0.49)

## 2019-01-28 IMAGING — CT CT ANGIO CHEST
2 of 7 series · 17 of 46 positions shown · IV contrast (APPLIED)
Comparison: CT scan of September 11, 2016.

CLINICAL DATA: Shortness of breath, chest pain.

EXAM:
CT ANGIOGRAPHY CHEST WITH CONTRAST
TECHNIQUE: Multidetector CT imaging of the chest was performed using the
standard protocol during bolus administration of intravenous
contrast. Multiplanar CT image reconstructions and MIPs were
obtained to evaluate the vascular anatomy.
CONTRAST:  75 mL OMNIPAQUE IOHEXOL 350 MG/ML SOLN

[Series 7: thins · axial · 0.80mm/px · z∈[+1183,+1384]mm · 14 of 324 slices shown]
[im 18/324  lung]
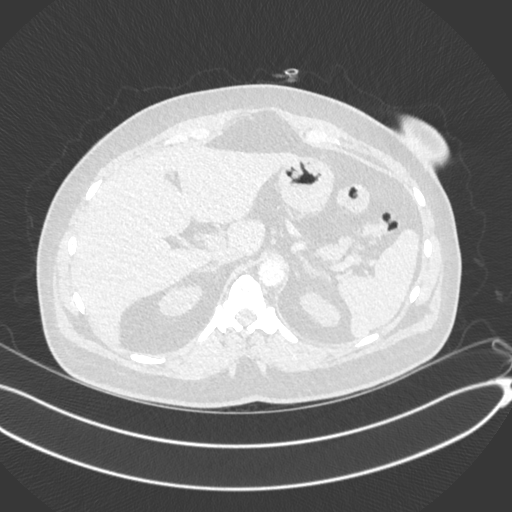
[im 36/324  soft-tissue]
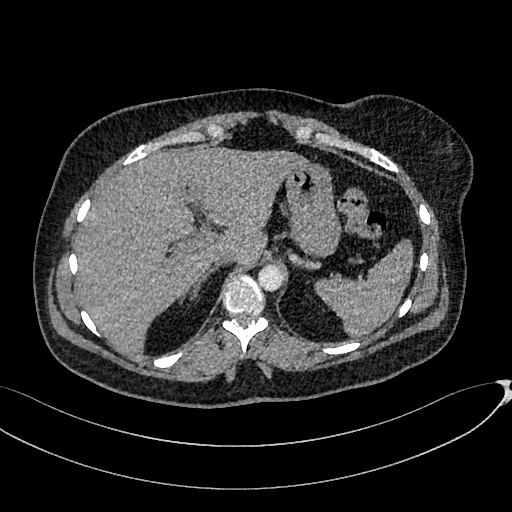
[im 72/324  lung]
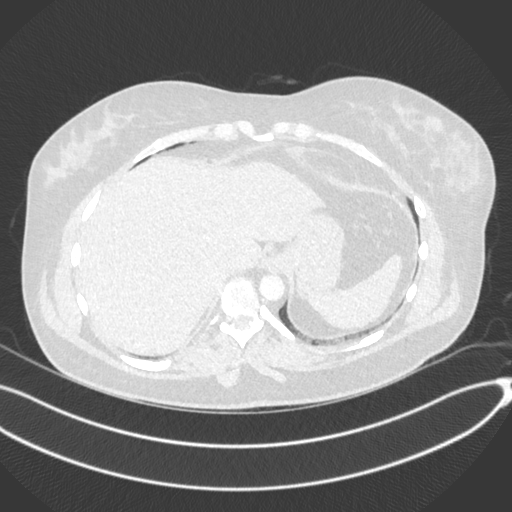
[im 90/324  soft-tissue]
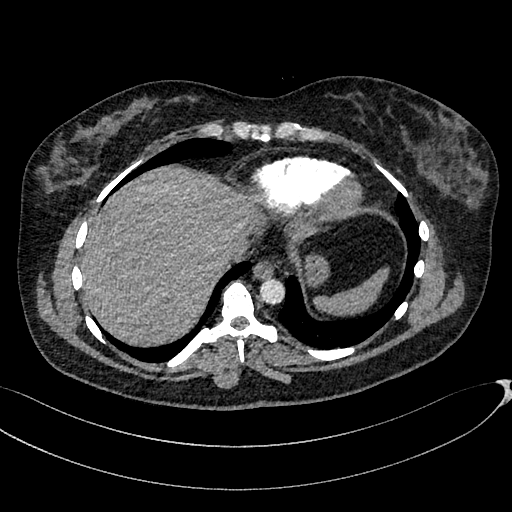
[im 108/324  lung]
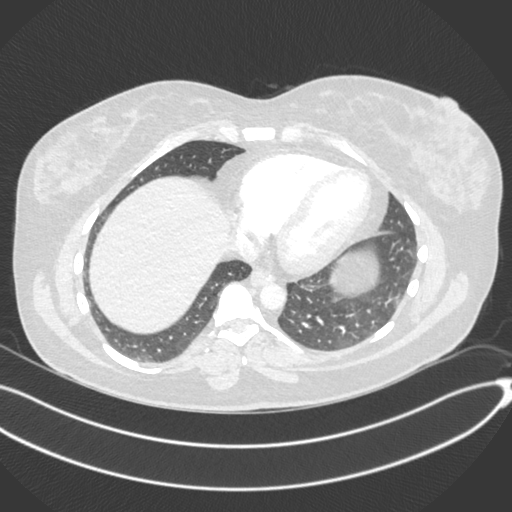
[im 126/324  soft-tissue]
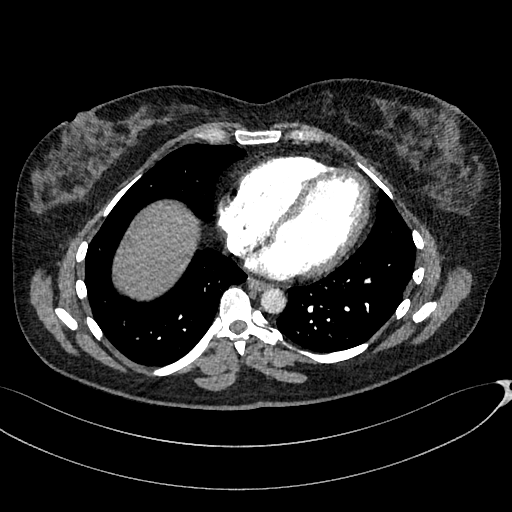
[im 144/324  lung]
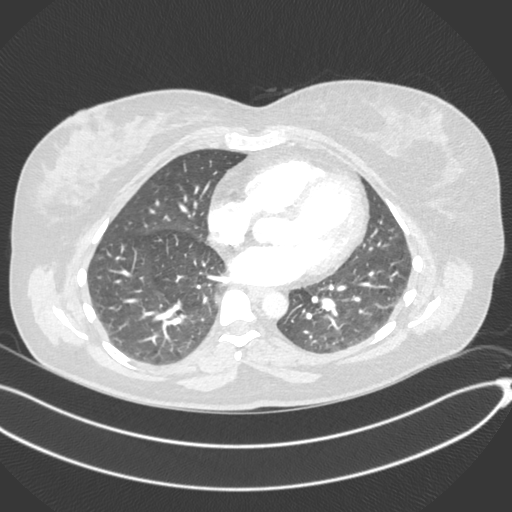
[im 180/324  soft-tissue]
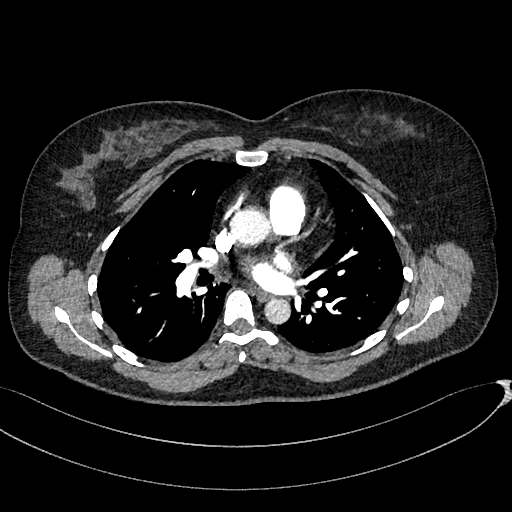
[im 198/324  lung]
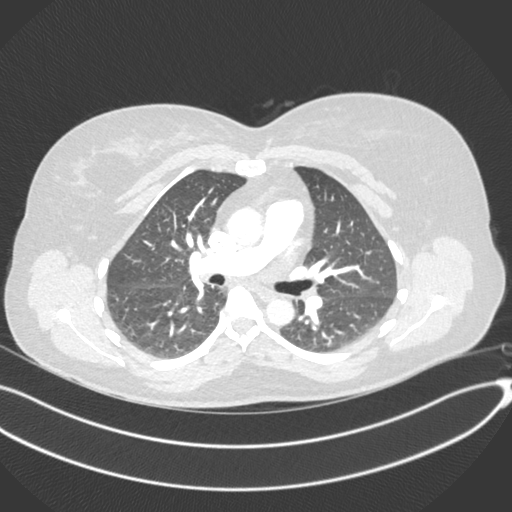
[im 216/324  soft-tissue]
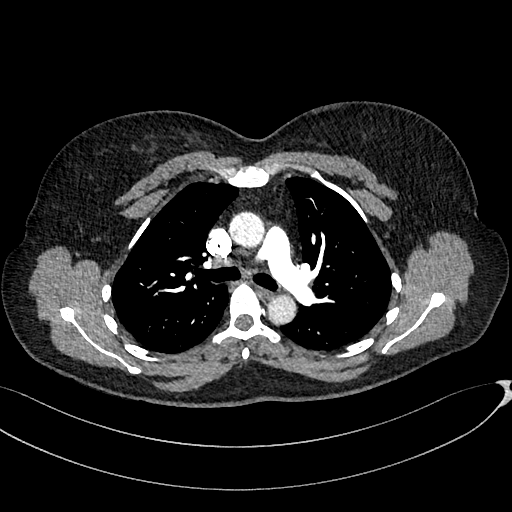
[im 234/324  lung]
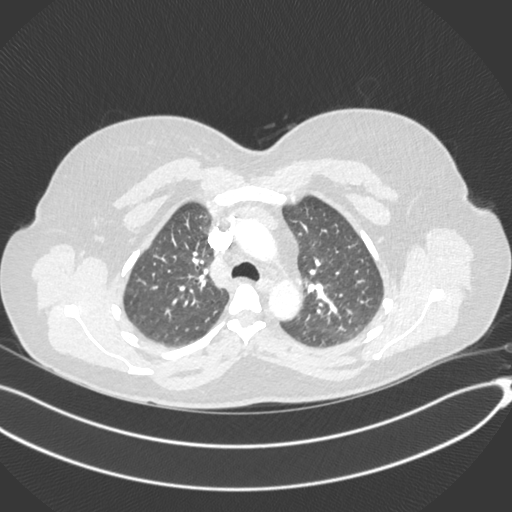
[im 252/324  soft-tissue]
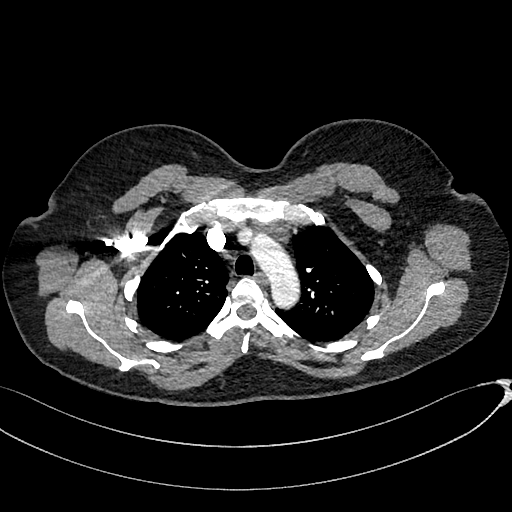
[im 288/324  lung]
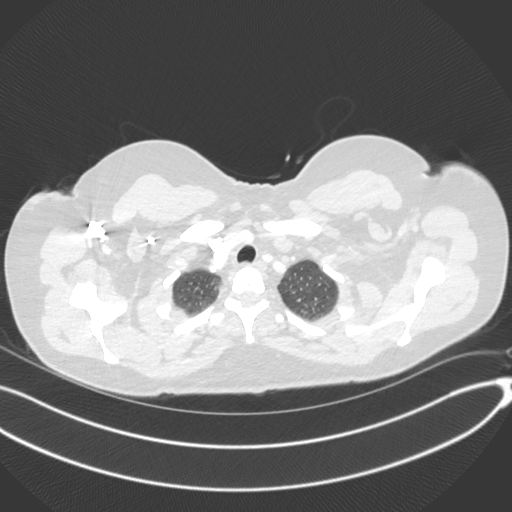
[im 306/324  soft-tissue]
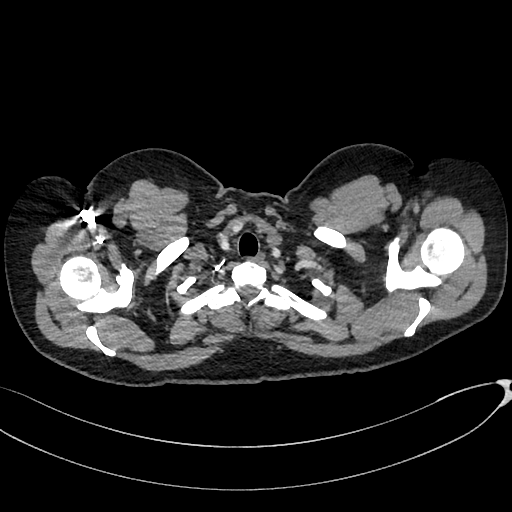

[Series 8: cor · coronal · 0.45mm/px · 3 of 141 slices shown]
[im 36/141  soft-tissue]
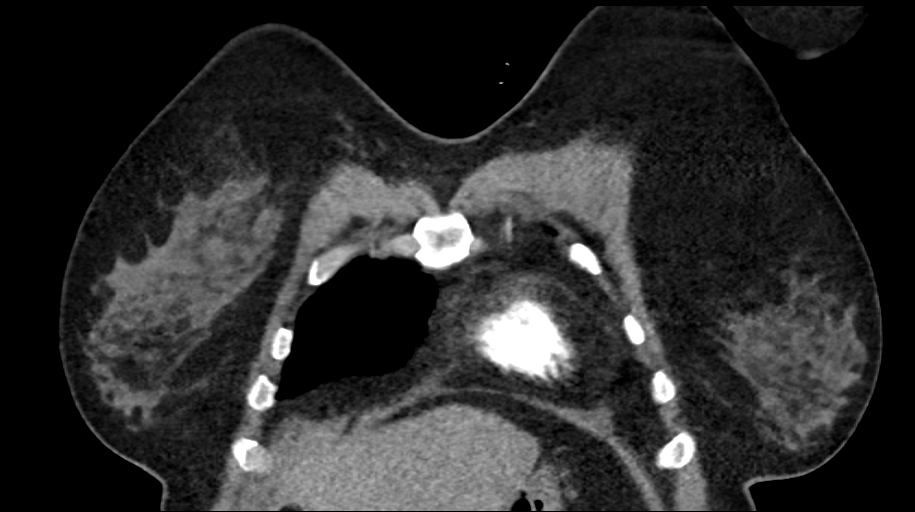
[im 71/141  soft-tissue]
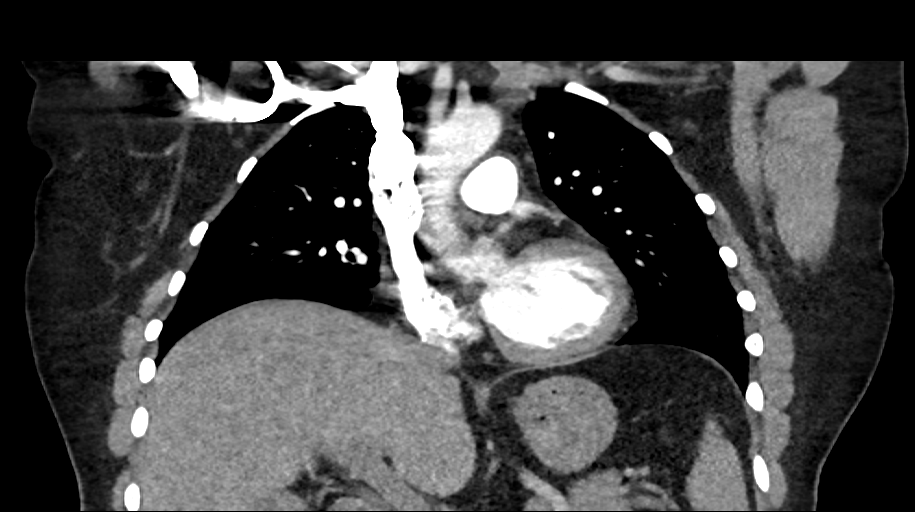
[im 106/141  soft-tissue]
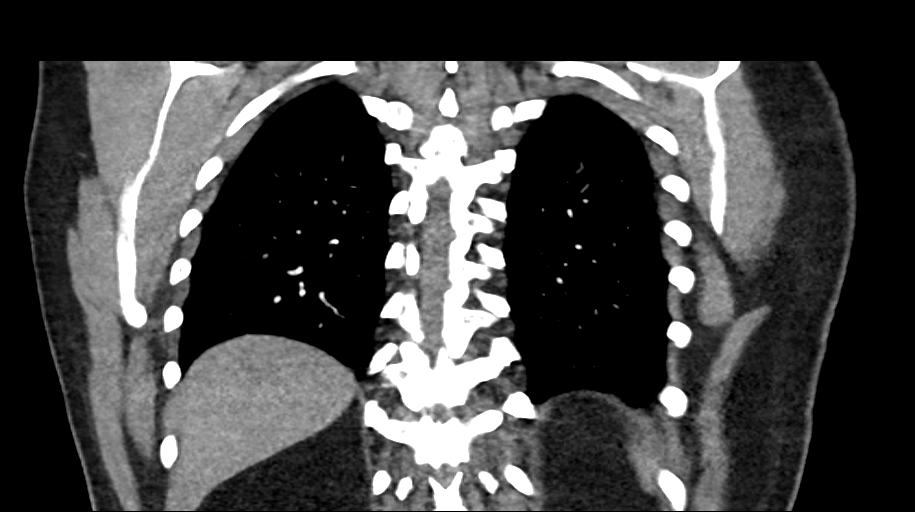

[17 of 46 positions shown; findings below may reference images not displayed]

FINDINGS: Cardiovascular: Satisfactory opacification of the pulmonary arteries
to the segmental level. No evidence of pulmonary embolism. Normal
heart size. No pericardial effusion.

Mediastinum/Nodes: No enlarged mediastinal, hilar, or axillary lymph
nodes. Thyroid gland, trachea, and esophagus demonstrate no
significant findings.

Lungs/Pleura: Lungs are clear. No pleural effusion or pneumothorax.

Upper Abdomen: No acute abnormality.

Musculoskeletal: No chest wall abnormality. No acute or significant
osseous findings.

Review of the MIP images confirms the above findings.
IMPRESSION: No definite evidence of pulmonary embolus. No acute abnormality seen
in the chest.

## 2019-01-28 MED ORDER — IOHEXOL 350 MG/ML SOLN
75.0000 mL | Freq: Once | INTRAVENOUS | Status: AC | PRN
  Administered 2019-01-28: 17:00:00 100 mL via INTRAVENOUS

## 2019-01-28 NOTE — Telephone Encounter (Signed)
-----   Message from Richardo Priest, MD sent at 01/28/2019  7:37 AM EDT ----- Normal or stable result  We did a BNP and D Dimer stat, where are results Needs CTA chest re pulmonary embolism  I dont think she should travel to [place]

## 2019-01-28 NOTE — Telephone Encounter (Signed)
Left message for patient to return call to inform her that her CTA chest PE has been scheduled at [facility] today at 3:30 pm. Patient will need to arrive at 3:15 pm and not eat any solid foods 4 hours prior to appointment. Will relay information when patient returns call.

## 2019-01-28 NOTE — Telephone Encounter (Signed)
Patient informed of lab results and echocardiogram results. Advised patient that Dr. Bettina Gavia wants to her to have a STAT CTA chest PE w/wo contrast to rule out pulmonary embolism. Patient is agreeable to have testing done at the [facility] office. Will have the front desk schedule testing and call patient to inform her of date and time. Patient verbalized understanding. No further questions.

## 2019-01-29 ENCOUNTER — Telehealth: Payer: Self-pay

## 2019-01-29 DIAGNOSIS — R0602 Shortness of breath: Secondary | ICD-10-CM

## 2019-01-29 MED ORDER — METOPROLOL SUCCINATE ER 50 MG PO TB24: 100.0000 mg | ORAL_TABLET | Freq: Once | ORAL | 0 refills | Status: DC

## 2019-01-29 NOTE — Telephone Encounter (Signed)
Patient advised that all testing has been reviewed by Dr Bettina Gavia and none of the testing shows congestive heart failure or pulmonary embolism. Patient aware that she will need to have Cardiac CTA per Dr Bettina Gavia.  She was also advised that Dr Bettina Gavia would like her to be seen by pulmonology for pulmonary function tests.  Referral has been entered and she should receive a call with consult information.  Cardiac CTA instructions reviewed with patient and a copy of instructions was mailed to the patient for her review.  Patient advised to contact our office with any questions or concerns.  Patient agreed to plan and verbalized understanding. No further questions.

## 2019-01-29 NOTE — Addendum Note (Signed)
Addended by: Stevan Born on: 01/29/2019 01:13 PM   Modules accepted: Orders

## 2019-01-29 NOTE — Addendum Note (Signed)
Addended by: Shirlee More on: 01/29/2019 01:27 PM   Modules accepted: Orders

## 2019-02-02 LAB — D-DIMER, QUANTITATIVE

## 2019-02-04 LAB — PRO B NATRIURETIC PEPTIDE: NT-Pro BNP: 30 pg/mL (ref 0–249)

## 2019-02-04 LAB — SPECIMEN STATUS REPORT

## 2019-02-04 LAB — TROPONIN I: Troponin I: <0.01 ng/mL (ref 0.00–0.04)

## 2019-02-10 ENCOUNTER — Ambulatory Visit: Payer: Commercial Managed Care - PPO | Admitting: Family

## 2019-03-03 ENCOUNTER — Telehealth (HOSPITAL_COMMUNITY): Payer: Self-pay | Admitting: Emergency Medicine

## 2019-03-03 NOTE — Telephone Encounter (Signed)
Left message on voicemail with name and callback number Kittie Krizan RN Navigator Cardiac Imaging [facility] [phone] Office [phone] Cell  

## 2019-03-04 ENCOUNTER — Telehealth (HOSPITAL_COMMUNITY): Payer: Self-pay | Admitting: Emergency Medicine

## 2019-03-04 NOTE — Telephone Encounter (Signed)
Reaching out to patient to offer assistance regarding upcoming cardiac imaging study; pt verbalizes understanding of appt date/time, parking situation and where to check in, pre-test NPO status and medications ordered, and verified current allergies; name and call back number provided for further questions should they arise Emilia Kayes RN Navigator Cardiac Imaging [facility] [phone] office [phone] cell 

## 2019-03-05 ENCOUNTER — Ambulatory Visit (HOSPITAL_COMMUNITY)
Admission: RE | Admit: 2019-03-05 | Discharge: 2019-03-05 | Disposition: A | Discharge status: Home / Self Care | Payer: Commercial Managed Care - PPO | Source: Ambulatory Visit | Attending: Cardiology | Admitting: Cardiology

## 2019-03-05 ENCOUNTER — Encounter: Payer: Self-pay | Admitting: *Deleted

## 2019-03-05 ENCOUNTER — Encounter: Payer: Self-pay | Admitting: Internal Medicine

## 2019-03-05 ENCOUNTER — Other Ambulatory Visit: Payer: Self-pay

## 2019-03-05 DIAGNOSIS — R072 Precordial pain: Secondary | ICD-10-CM | POA: Insufficient documentation

## 2019-03-05 DIAGNOSIS — Z006 Encounter for examination for normal comparison and control in clinical research program: Secondary | ICD-10-CM

## 2019-03-05 DIAGNOSIS — R0602 Shortness of breath: Secondary | ICD-10-CM

## 2019-03-05 IMAGING — CT CT HEART MORP W/ CTA COR W/ SCORE W/ CA W/CM &/OR W/O CM
4 of 7 series · 8 of 20 positions shown, 9 images · IV contrast (APPLIED)
Comparison: Chest CTA 09/12/2016.
COMPARISON: Chest CTA 09/12/2016.

Addendum:
EXAM:
OVER-READ INTERPRETATION  CT CHEST

The following report is an over-read performed by radiologist Dr.
Dietrich Hagans [REDACTED] on 03/05/2019. This
over-read does not include interpretation of cardiac or coronary
anatomy or pathology. The coronary calcium score/coronary CTA
interpretation by the cardiologist is attached.
CLINICAL DATA: Chest pain
Cardiac CTA
MEDICATIONS:
Sub lingual nitro. 4 mg and lopressor 30mg
TECHNIQUE: The patient was scanned on a Siemens Force 192 scanner. Gantry
rotation speed was 250 msecs. Collimation was. 6 mm . A 120 kV
prospective scan was triggered in the ascending thoracic aorta at
140 HU's with full mA between 30-70% of the R-R interval . Average
HR during the scan was 67 bpm. The 3D data set was interpreted on a
dedicated work station using MPR, MIP and VRT modes. A total of 80
cc of contrast was used.

[Series 6: best diast 71 % · axial · 0.39mm/px · z∈[+1132,+1178]mm · 2 of 343 slices shown]
[im 115/343  vessel]
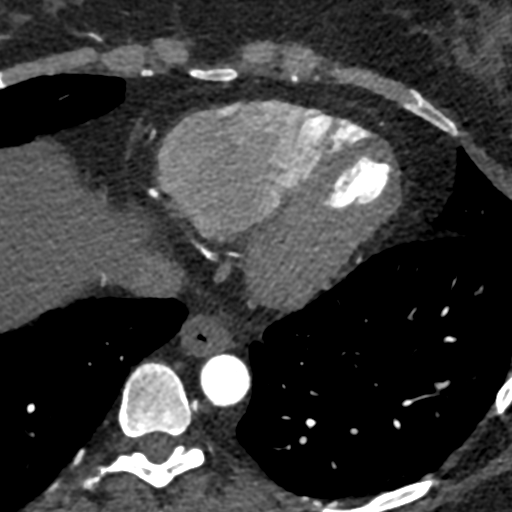
[im 229/343  vessel]
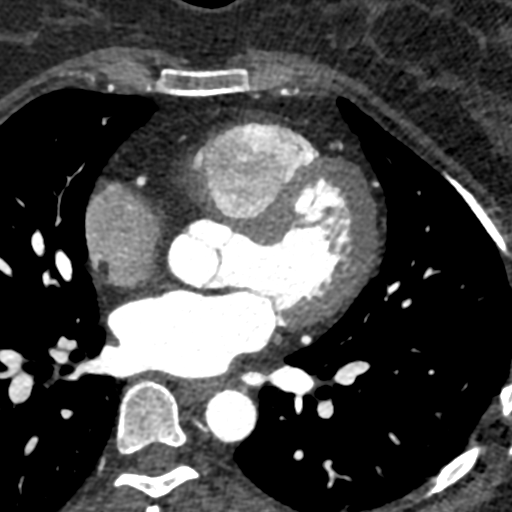

[Series 7: best syst · axial · 0.39mm/px · z∈[+1132,+1178]mm · 2 of 343 slices shown, 3 images]
[im 115/343  vessel]
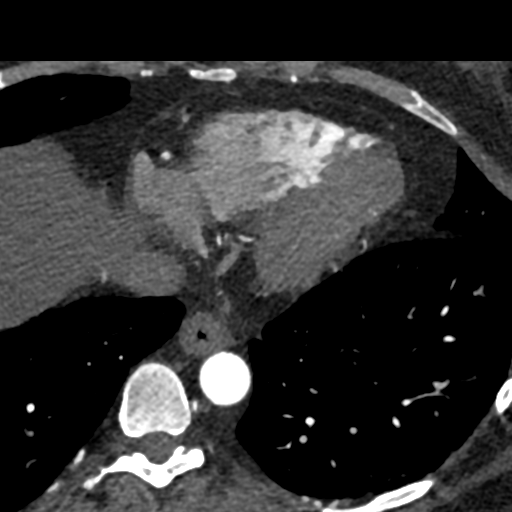
[im 115/343  lung]
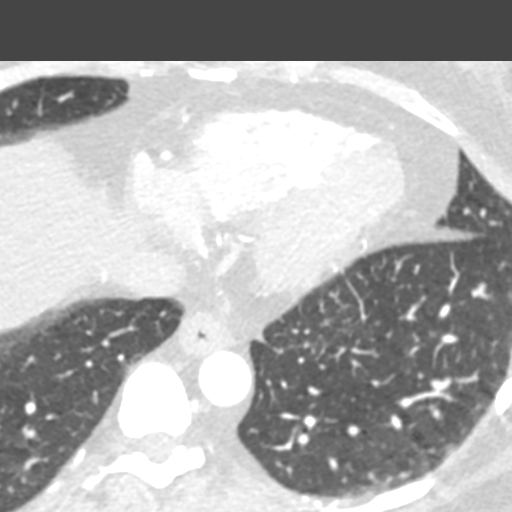
[im 229/343  vessel]
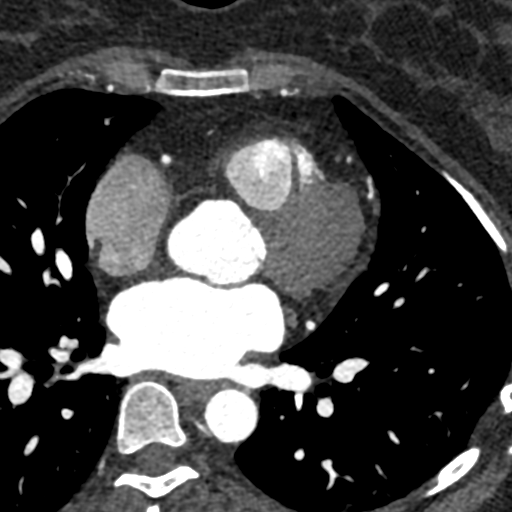

[Series 8: ts diast sharp 71 % · axial · 0.39mm/px · z∈[+1132,+1178]mm · 2 of 343 slices shown]
[im 115/343  lung]
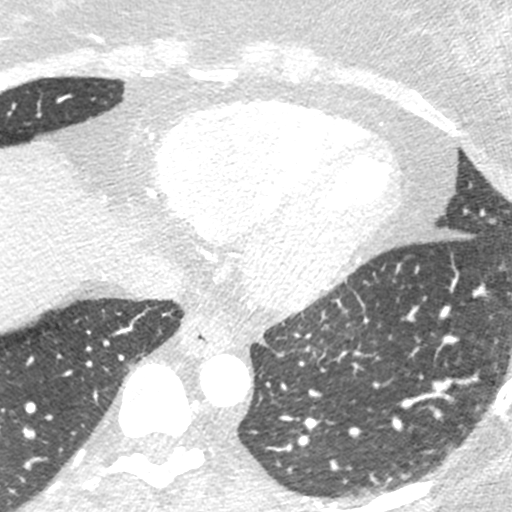
[im 229/343  lung]
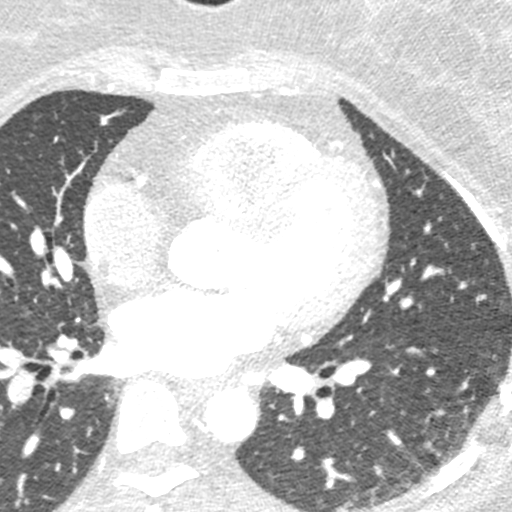

[Series 9: ts syst sharp · axial · 0.39mm/px · z∈[+1132,+1178]mm · 2 of 343 slices shown]
[im 115/343  lung]
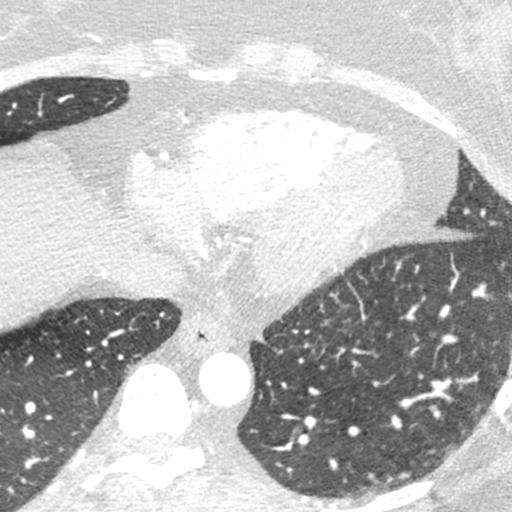
[im 229/343  lung]
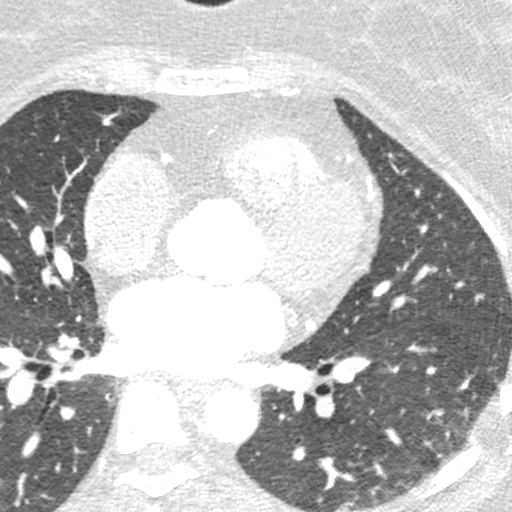

[8 of 20 positions shown; findings below may reference images not displayed]

FINDINGS: Within the visualized portions of the thorax there are no suspicious
appearing pulmonary nodules or masses, there is no acute
consolidative airspace disease, no pleural effusions, no
pneumothorax and no lymphadenopathy. Visualized portions of the
upper abdomen are unremarkable. There are no aggressive appearing
lytic or blastic lesions noted in the visualized portions of the
skeleton.
IMPRESSION: 1. No significant incidental noncardiac findings are noted.
FINDINGS: Non-cardiac: See separate report from [REDACTED]. No
significant findings on limited lung and soft tissue windows.

Calcium score: No calcium noted

Coronary Arteries: Right dominant with no anomalies

LM: Normal

LAD: Normal

IM: Small vessel normal

D1: Normal

D2: Normal

Circumflex: Normal

OM1: Normal

OM2: Normal

RCA: Normal

PDA: Normal

PLA: Normal
IMPRESSION: 1. Calcium score 0

2.  Normal aortic root 2.8 cm

3.  Normal right dominant coronary arteries

Umberto Broadwater

*** End of Addendum ***
EXAM:
OVER-READ INTERPRETATION  CT CHEST

The following report is an over-read performed by radiologist Dr.
Dietrich Hagans [REDACTED] on 03/05/2019. This
over-read does not include interpretation of cardiac or coronary
anatomy or pathology. The coronary calcium score/coronary CTA
interpretation by the cardiologist is attached.
FINDINGS: Within the visualized portions of the thorax there are no suspicious
appearing pulmonary nodules or masses, there is no acute
consolidative airspace disease, no pleural effusions, no
pneumothorax and no lymphadenopathy. Visualized portions of the
upper abdomen are unremarkable. There are no aggressive appearing
lytic or blastic lesions noted in the visualized portions of the
skeleton.
IMPRESSION: 1. No significant incidental noncardiac findings are noted.

## 2019-03-05 MED ORDER — METOPROLOL TARTRATE 5 MG/5ML IV SOLN
5.0000 mg | INTRAVENOUS | Status: AC | PRN
  Administered 2019-03-05 (×3): 5 mg via INTRAVENOUS
  Filled 2019-03-05 (×4): qty 5

## 2019-03-05 MED ORDER — NITROGLYCERIN 0.4 MG SL SUBL
SUBLINGUAL_TABLET | SUBLINGUAL | Status: AC
  Filled 2019-03-05: qty 2

## 2019-03-05 MED ORDER — NITROGLYCERIN 0.4 MG SL SUBL
0.8000 mg | SUBLINGUAL_TABLET | Freq: Once | SUBLINGUAL | Status: AC
  Administered 2019-03-05: 0.8 mg via SUBLINGUAL
  Filled 2019-03-05: qty 25

## 2019-03-05 MED ORDER — IOHEXOL 350 MG/ML SOLN
80.0000 mL | Freq: Once | INTRAVENOUS | Status: AC | PRN
  Administered 2019-03-05: 80 mL via INTRAVENOUS

## 2019-03-05 MED ORDER — METOPROLOL TARTRATE 5 MG/5ML IV SOLN
INTRAVENOUS | Status: AC
  Administered 2019-03-05: 13:00:00 5 mg via INTRAVENOUS
  Filled 2019-03-05: qty 25

## 2019-03-05 MED ORDER — DILTIAZEM HCL 25 MG/5ML IV SOLN
INTRAVENOUS | Status: AC
  Filled 2019-03-05: qty 5

## 2019-03-05 MED ORDER — DILTIAZEM HCL 25 MG/5ML IV SOLN
5.0000 mg | Freq: Once | INTRAVENOUS | Status: AC
  Administered 2019-03-05: 14:00:00 5 mg via INTRAVENOUS
  Filled 2019-03-05: qty 5

## 2019-03-05 MED ORDER — METOPROLOL TARTRATE 5 MG/5ML IV SOLN
10.0000 mg | INTRAVENOUS | Status: AC | PRN
  Administered 2019-03-05: 10 mg via INTRAVENOUS
  Filled 2019-03-05: qty 10

## 2019-03-05 NOTE — Progress Notes (Signed)
Synopsis: Referred in Oct 2020 for DOE by Richardo Priest, MD  Subjective:   PATIENT ID: Lisa Novak GENDER: female DOB: 05-11-1971, MRN: [id number]  Chief Complaint  Patient presents with  . Consult    Consult for SOB. Developed SOB back in March, [place] with Bronchitis. Reports she takes prednisone intermittently for RA. Reports she is SOB all the time, dry cough, and increased wheezing that is worse at  night. Denies chest pain.     HPI 48 year old woman with RA on MTX and recently orencia (antibody) p/w progressive DOE over past several years, worsened in past 6 months.  Extensive cardiac workup revealed no PE, normal echo, and normal CCTA.  Denies cough. +occasional wheezing.  Never hospitalized for breathing.  RA manifests in wrists, elbows, and back and is poorly controlled.  She noted the dyspnea was markedly worse with orencia leading to its discontinuation about 6 weeks ago.  She has had some improvement but not back to baseline.  Never smoker.  Has GERD on PPI, night-time cough worse when she does not take this.  She also finds a correlation with weight and breathing status.  Past Medical History:  Diagnosis Date  . Diabetes type 2, controlled ([place])   . Diverticulitis   . GERD (gastroesophageal reflux disease)   . Hypertension   . PONV (postoperative nausea and vomiting)   . RA (rheumatoid arthritis) ([place])   . SVD (spontaneous vaginal delivery)    x 3  . Umbilical hernia      Family History  Problem Relation Age of Onset  . COPD Mother      Past Surgical History:  Procedure Laterality Date  . ABDOMINAL HYSTERECTOMY    . ABDOMINOPLASTY    . APPENDECTOMY    . HERNIA REPAIR    . LAPAROSCOPIC APPENDECTOMY N/A 02/11/2014   Procedure: APPENDECTOMY LAPAROSCOPIC;  Surgeon: Autumn Messing III, MD;  Location: [facility];  Service: General;  Laterality: N/A;  . ROBOTIC ASSISTED LAPAROSCOPIC OVARIAN CYSTECTOMY Left 04/13/2014   Procedure: ROBOTIC ASSISTED LAPAROSCOPIC Left OVARIAN  CYSTECTOMY, BILATERAL SALPINGECTOMY,lysis of adhesions;  Surgeon: Princess Bruins, MD;  Location: [facility];  Service: Gynecology;  Laterality: Left;  . WISDOM TOOTH EXTRACTION      Social History   Socioeconomic History  . Marital status: Married    Spouse name: Not on file  . Number of children: Not on file  . Years of education: Not on file  . Highest education level: Not on file  Occupational History  . Not on file  Social Needs  . Financial resource strain: Not on file  . Food insecurity    Worry: Not on file    Inability: Not on file  . Transportation needs    Medical: Not on file    Non-medical: Not on file  Tobacco Use  . Smoking status: Never Smoker  . Smokeless tobacco: Never Used  Substance and Sexual Activity  . Alcohol use: No  . Drug use: No  . Sexual activity: Yes    Birth control/protection: Condom, Surgical    Comment: Hysterectony  Lifestyle  . Physical activity    Days per week: Not on file    Minutes per session: Not on file  . Stress: Not on file  Relationships  . Social Herbalist on phone: Not on file    Gets together: Not on file    Attends religious service: Not on file    Active member of club or  organization: Not on file    Attends meetings of clubs or organizations: Not on file    Relationship status: Not on file  . Intimate partner violence    Fear of current or ex partner: Not on file    Emotionally abused: Not on file    Physically abused: Not on file    Forced sexual activity: Not on file  Other Topics Concern  . Not on file  Social History Narrative  . Not on file     No Known Allergies   Outpatient Medications Prior to Visit  Medication Sig Dispense Refill  . calcium carbonate (TUMS - DOSED IN MG ELEMENTAL CALCIUM) 500 MG chewable tablet Chew 2 tablets by mouth daily as needed for indigestion or heartburn. Reported on 08/17/2015    . FOLIC ACID PO Take by mouth. 2 TABLE QD.    . lansoprazole (PREVACID) 15 MG  capsule Take 15 mg by mouth daily at 12 noon.    Marland Kitchen losartan-hydrochlorothiazide (HYZAAR) 50-12.5 MG per tablet Take 1 tablet by mouth daily.    . methotrexate (RHEUMATREX) 2.5 MG tablet Takes 6 tablets weekly.    Marland Kitchen spironolactone (ALDACTONE) 100 MG tablet daily.    Marland Kitchen abatacept (ORENCIA) 250 MG injection Inject into the vein once a week.    Marland Kitchen CAMILA 0.35 MG tablet Take 1 tablet by mouth daily. Reported on 08/17/2015  3  . metoprolol succinate (TOPROL-XL) 50 MG 24 hr tablet Take 2 tablets (100 mg total) by mouth once for 1 dose. Take with or immediately following a meal. 2 tablet 0   No facility-administered medications prior to visit.      Positive Symptoms in bold: Constitutional fevers, chills, weight loss, fatigue, anorexia, malaise  Eyes decreased vision, double vision, eye irritation  Ears, Nose, Mouth, Throat sore throat, trouble swallowing, sinus congestion  Cardiovascular chest pain, paroxysmal nocturnal dyspnea, lower ext edema, palpitations   Respiratory SOB, cough, DOE, hemoptysis, wheezing  Gastrointestinal nausea, vomiting, diarrhea  Genitourinary burning with urination, trouble urinating  Musculoskeletal joint aches, joint swelling, back pain  Integumentary  rashes, skin lesions  Neurological focal weakness, focal numbness, trouble speaking, headaches  Psychiatric depression, anxiety, confusion  Endocrine polyuria, polydipsia, cold intolerance, heat intolerance  Hematologic abnormal bruising, abnormal bleeding, unexplained nose bleeds  Allergic/Immunologic recurrent infections, hives, swollen lymph nodes    Objective:  GEN: middle aged woman in NAD HEENT: MMM, no thrush CV: RRR, ext warm PULM: Clear without wheezing or crackles GI: soft, +BS EXT: No edema, no advanced RA changes that I can see NEURO: ambulates normally, moves all 4 ext PSYCH: AOx3, good insight SKIN: No rashes    Vitals:   03/06/19 1338  BP: 120/78  Pulse: 77  Temp: 98 F (36.7 C)   TempSrc: Temporal  SpO2: 97%  Weight: 179 lb 6.4 oz (81.4 kg)  Height: 5' 4.5" (1.638 m)   97% on RA BMI Readings from Last 3 Encounters:  03/06/19 30.32 kg/m  01/27/19 29.79 kg/m  10/01/16 28.29 kg/m   Wt Readings from Last 3 Encounters:  03/06/19 179 lb 6.4 oz (81.4 kg)  01/27/19 176 lb 4 oz (79.9 kg)  10/01/16 170 lb (77.1 kg)     CBC    Component Value Date/Time   WBC 9.1 01/27/2019 1130   WBC 13.0 (H) 07/28/2018 2244   RBC 4.71 01/27/2019 1130   RBC 4.49 07/28/2018 2244   HGB 14.5 01/27/2019 1130   HCT 42.1 01/27/2019 1130   PLT 280 01/27/2019 1130  MCV 89 01/27/2019 1130   MCH 30.8 01/27/2019 1130   MCH 30.7 07/28/2018 2244   MCHC 34.4 01/27/2019 1130   MCHC 33.7 07/28/2018 2244   RDW 13.5 01/27/2019 1130   LYMPHSABS 1.2 02/11/2014 1035   MONOABS 0.7 02/11/2014 1035   EOSABS 0.1 02/11/2014 1035   BASOSABS 0.0 02/11/2014 1035     Chest Imaging: Read as normal, looks like mosaicism to me, no fibrosis  Pulmonary Functions Testing Results: No flowsheet data found.  FeNO: none  Pathology: none  Echocardiogram: Sept 2020 WNL  Heart Catheterization: none    Assessment & Plan:  Progressive DOE- in setting of RA and CT appearance a little concerning to me for a RA-associated pneumonitis like early BO.  Unfortunately orencia and MTX can be associated with pneumonitis also so maybe stopping the former has led to improvement.  Need a ILD protocolized scan to look for fixed mosaicism.    Discussion: Trial of breo, rinse out mouth after use Continue PPI Check PFTs and ILD protocol CT then f/u with me to discuss   Current Outpatient Medications:  .  calcium carbonate (TUMS - DOSED IN MG ELEMENTAL CALCIUM) 500 MG chewable tablet, Chew 2 tablets by mouth daily as needed for indigestion or heartburn. Reported on 08/17/2015, Disp: , Rfl:  .  FOLIC ACID PO, Take by mouth. 2 TABLE QD., Disp: , Rfl:  .  lansoprazole (PREVACID) 15 MG capsule, Take 15 mg by mouth  daily at 12 noon., Disp: , Rfl:  .  losartan-hydrochlorothiazide (HYZAAR) 50-12.5 MG per tablet, Take 1 tablet by mouth daily., Disp: , Rfl:  .  methotrexate (RHEUMATREX) 2.5 MG tablet, Takes 6 tablets weekly., Disp: , Rfl:  .  spironolactone (ALDACTONE) 100 MG tablet, daily., Disp: , Rfl:  .  abatacept (ORENCIA) 250 MG injection, Inject into the vein once a week., Disp: , Rfl:  .  fluticasone furoate-vilanterol (BREO ELLIPTA) 100-25 MCG/INH AEPB, Inhale 1 puff into the lungs daily., Disp: 38 each, Rfl: 11 .  fluticasone furoate-vilanterol (BREO ELLIPTA) 100-25 MCG/INH AEPB, Inhale 1 puff into the lungs daily., Disp: 1 each, Rfl: 0 .  omeprazole (PRILOSEC) 20 MG capsule, Take 1 capsule (20 mg total) by mouth daily., Disp: 90 capsule, Rfl: 3   Lisa Furbish, MD [facility] 03/06/2019 3:05 PM

## 2019-03-06 ENCOUNTER — Encounter: Payer: Self-pay | Admitting: Internal Medicine

## 2019-03-06 ENCOUNTER — Ambulatory Visit: Payer: Commercial Managed Care - PPO | Admitting: Internal Medicine

## 2019-03-06 DIAGNOSIS — J849 Interstitial pulmonary disease, unspecified: Secondary | ICD-10-CM

## 2019-03-06 MED ORDER — BREO ELLIPTA 100-25 MCG/INH IN AEPB: 1.0000 | INHALATION_SPRAY | Freq: Every day | RESPIRATORY_TRACT | 11 refills | Status: DC

## 2019-03-06 MED ORDER — BREO ELLIPTA 100-25 MCG/INH IN AEPB: 1.0000 | INHALATION_SPRAY | Freq: Every day | RESPIRATORY_TRACT | 0 refills | Status: DC

## 2019-03-06 MED ORDER — OMEPRAZOLE 20 MG PO CPDR: 20.0000 mg | DELAYED_RELEASE_CAPSULE | Freq: Every day | ORAL | 3 refills | Status: DC

## 2019-03-06 NOTE — Patient Instructions (Signed)
-   Try breo, get it filled only if it (A) helps and (B) is affordable.  If it is not affordable call us and we will see what's covered better under your insurance. - Continue omeprazole - Get CT chest for ILD - Lung function tests  We will go over everything next visit

## 2019-03-09 ENCOUNTER — Ambulatory Visit (INDEPENDENT_AMBULATORY_CARE_PROVIDER_SITE_OTHER)
Admission: RE | Admit: 2019-03-09 | Discharge: 2019-03-09 | Disposition: A | Discharge status: Home / Self Care | Payer: Commercial Managed Care - PPO | Source: Ambulatory Visit | Attending: Internal Medicine | Admitting: Internal Medicine

## 2019-03-09 ENCOUNTER — Other Ambulatory Visit: Payer: Self-pay

## 2019-03-09 DIAGNOSIS — J849 Interstitial pulmonary disease, unspecified: Secondary | ICD-10-CM

## 2019-03-09 IMAGING — CT CT CHEST HIGH RESOLUTION W/O CM
2 of 6 series · 15 of 36 positions shown, 18 images · non-contrast
Comparison: 01/28/2019, 09/12/2016.  CT abdomen 07/08/2017.

CLINICAL DATA: Interstitial lung disease. Increased shortness of
breath for 3-4 months. On methotrexate for rheumatoid arthritis.

EXAM:
CT CHEST WITHOUT CONTRAST
TECHNIQUE: Multidetector CT imaging of the chest was performed following the
standard protocol without intravenous contrast. High resolution
imaging of the lungs, as well as inspiratory and expiratory imaging,
was performed.

[Series 5: high resolution · axial · 0.69mm/px · z∈[-270,-22]mm · 12 of 282 slices shown, 15 images]
[im 17/282  mediastinal]
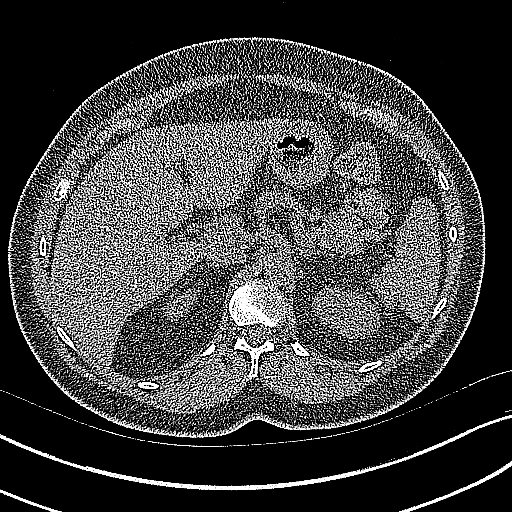
[im 17/282  lung]
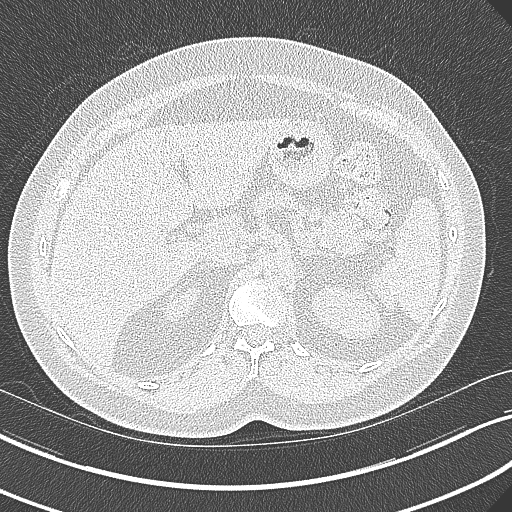
[im 50/282  lung]
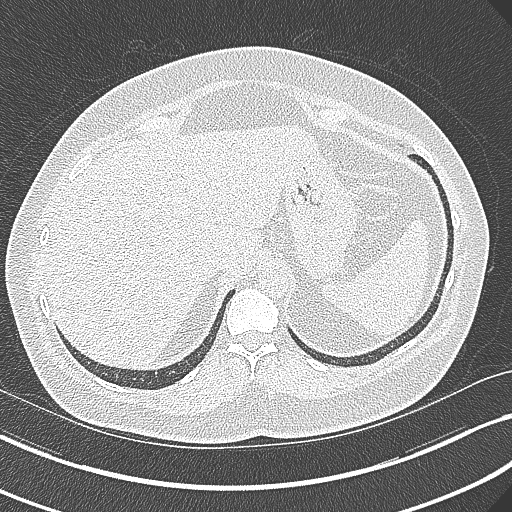
[im 67/282  lung]
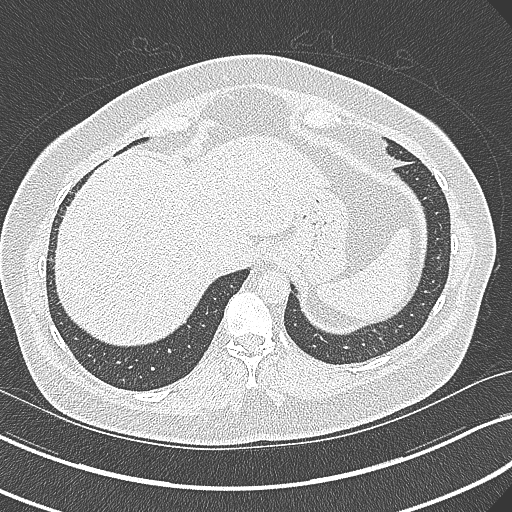
[im 83/282  lung]
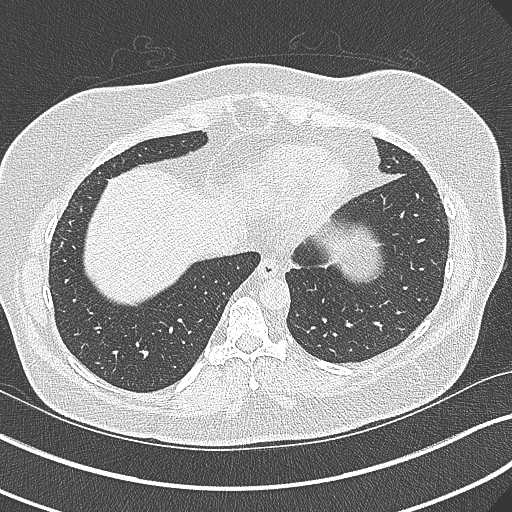
[im 116/282  mediastinal]
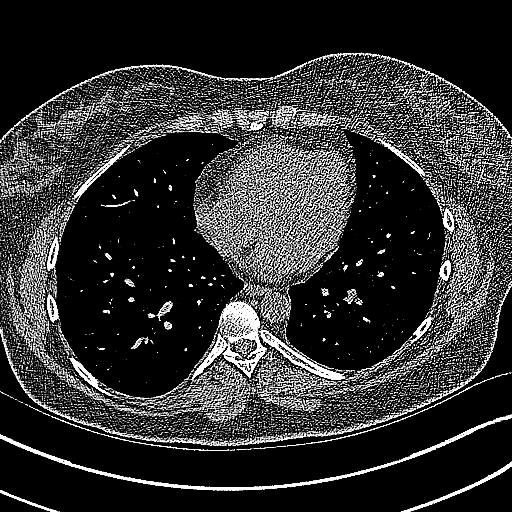
[im 116/282  lung]
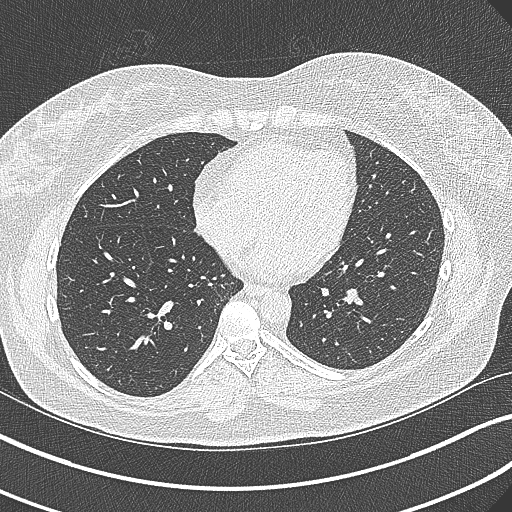
[im 133/282  lung]
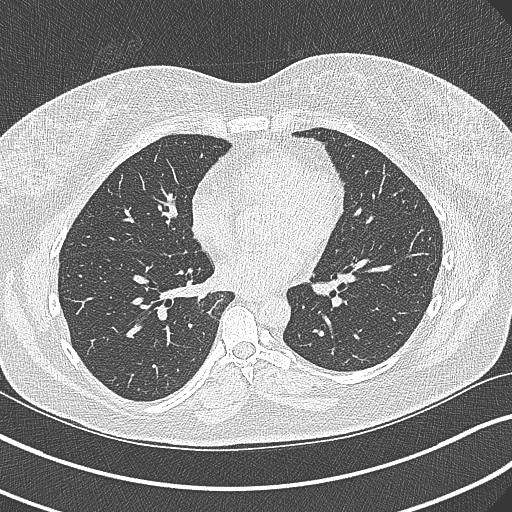
[im 149/282  lung]
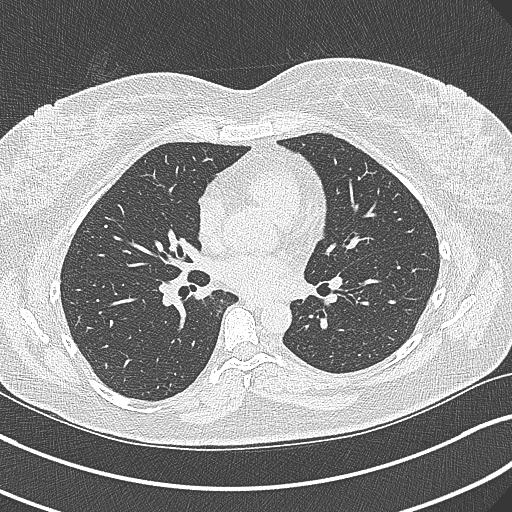
[im 182/282  lung]
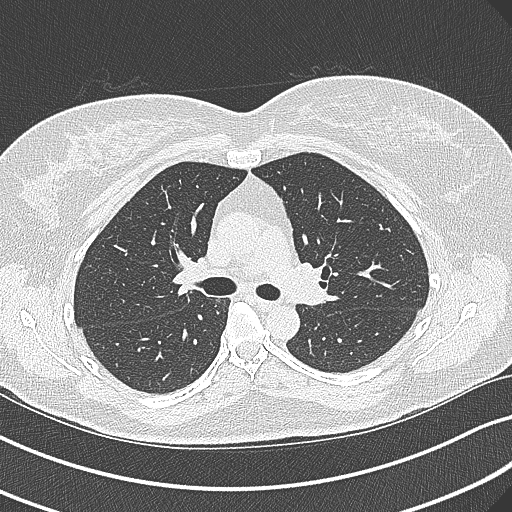
[im 199/282  mediastinal]
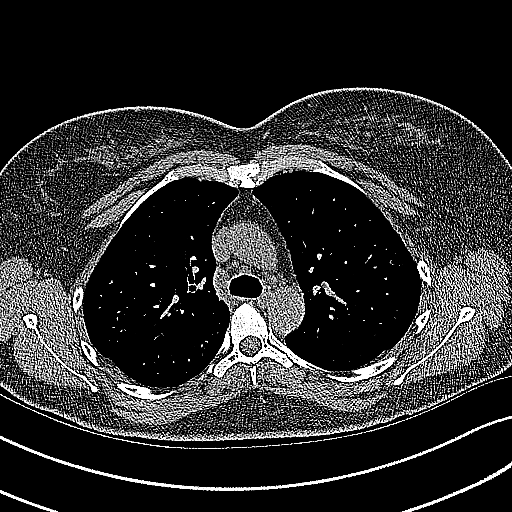
[im 199/282  lung]
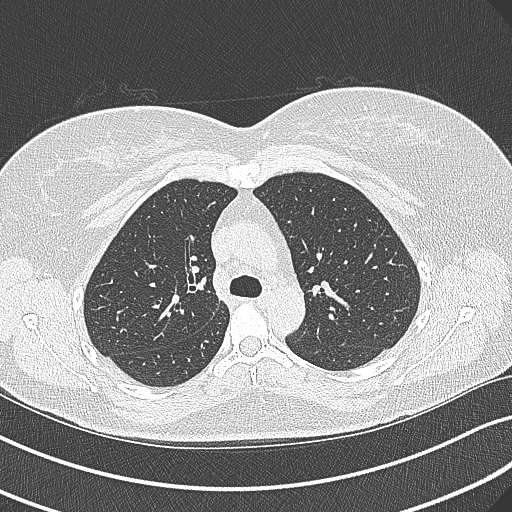
[im 215/282  lung]
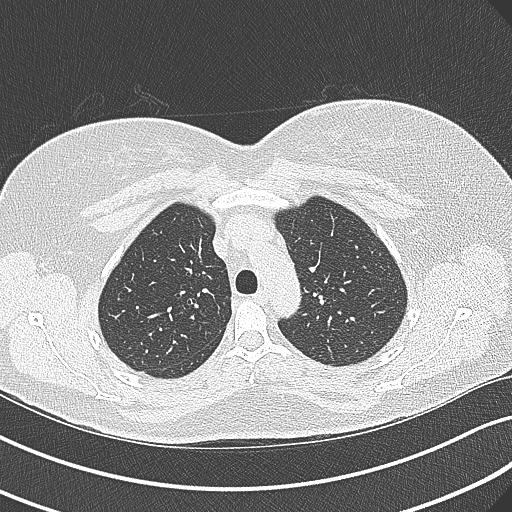
[im 248/282  lung]
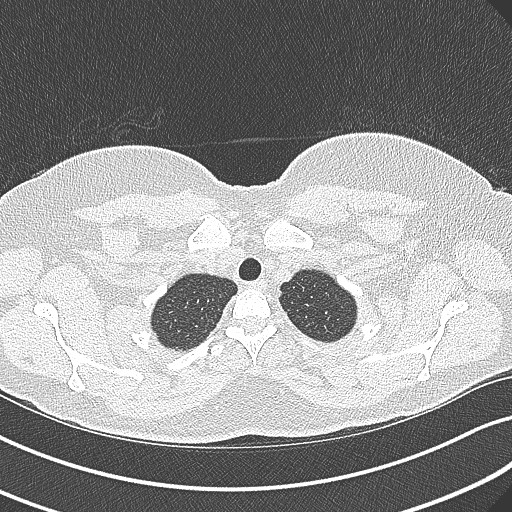
[im 265/282  lung]
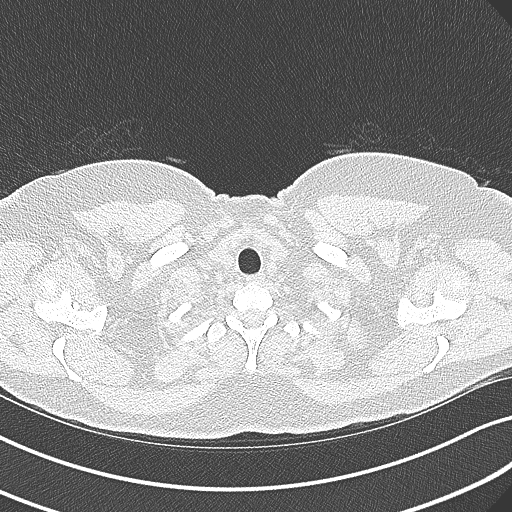

[Series 7: coronal · coronal · 0.59mm/px · 3 of 116 slices shown]
[im 24/116  lung]
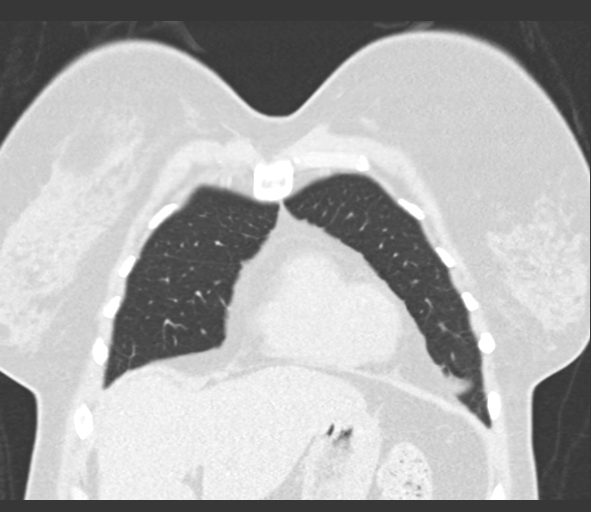
[im 47/116  lung]
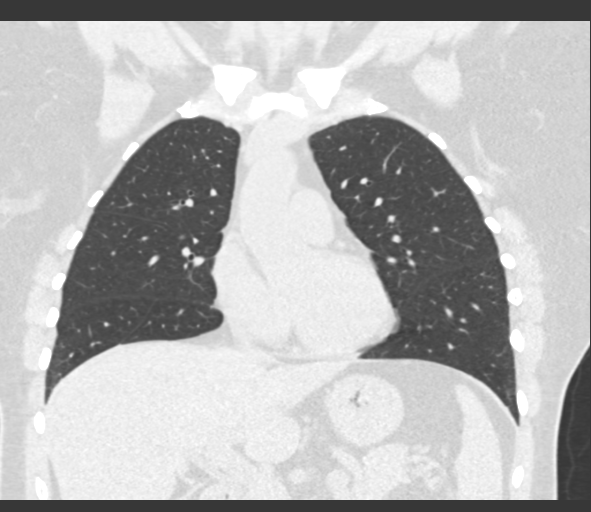
[im 70/116  lung]
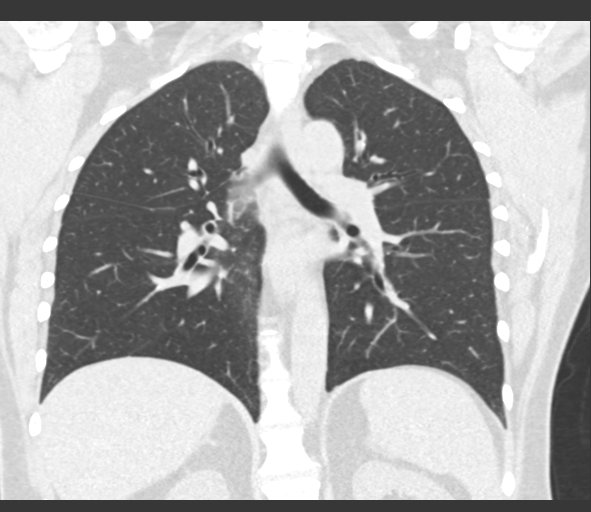

[15 of 36 positions shown; findings below may reference images not displayed]

FINDINGS: Cardiovascular: Vascular structures are unremarkable. Heart size
normal. No pericardial effusion.

Mediastinum/Nodes: No pathologically enlarged mediastinal or
axillary lymph nodes. Hilar regions are difficult to evaluate
without IV contrast but appear grossly unremarkable. Esophagus is
unremarkable.

Lungs/Pleura: Subtle subpleural reticular densities are seen in the
posterolateral aspects of both lower lobes and appear new from
07/08/2017. No traction bronchiectasis/bronchiolectasis,
architectural distortion or honeycombing. Lungs are otherwise clear.
No pleural fluid. Airway is unremarkable. There is air trapping.

Upper Abdomen: Visualized portions of the liver, adrenal glands,
kidneys, spleen, pancreas, stomach and bowel are grossly
unremarkable.

Musculoskeletal: No worrisome lytic or sclerotic lesions.
IMPRESSION: 1. Very subtle subpleural reticular densities in the posterolateral
aspects of both lower lobes, suspicious for interstitial lung
disease such as nonspecific interstitial pneumonitis. Findings are
indeterminate for UIP per consensus guidelines: Diagnosis of
Idiopathic Pulmonary Fibrosis: An Official ATS/ERS/JRS/ALAT Clinical
[id number]].
2. Air trapping is indicative of small airways disease.

## 2019-03-13 NOTE — Research (Signed)
CADFEM Informed Consent                  Subject Name:   Lisa Novak   Subject met inclusion and exclusion criteria.  The informed consent form, study requirements and expectations were reviewed with the subject and questions and concerns were addressed prior to the signing of the consent form.  The subject verbalized understanding of the trial requirements.  The subject agreed to participate in the CADFEM trial and signed the informed consent.  The informed consent was obtained prior to performance of any protocol-specific procedures for the subject.  A copy of the signed informed consent was given to the subject and a copy was placed in the subject's medical record. This patient was consented by Rico Sheehan on 03-05-2019 at 12:32 p.m.  [place] Chalmers, Research Assistant  03/05/2019 12:32 p.m.

## 2019-03-19 ENCOUNTER — Telehealth: Payer: Self-pay | Admitting: Internal Medicine

## 2019-03-19 NOTE — Telephone Encounter (Signed)
Spoke with the pt  She is asking for the results of her HRCT done 03/09/19  Please advise, thanks!

## 2019-03-24 NOTE — Telephone Encounter (Signed)
Done. thanks

## 2019-04-10 ENCOUNTER — Other Ambulatory Visit: Payer: Self-pay

## 2019-04-10 DIAGNOSIS — Z20822 Contact with and (suspected) exposure to covid-19: Secondary | ICD-10-CM

## 2019-04-13 LAB — NOVEL CORONAVIRUS, NAA: SARS-CoV-2, NAA: NOT DETECTED

## 2019-04-25 ENCOUNTER — Other Ambulatory Visit (HOSPITAL_COMMUNITY): Payer: Commercial Managed Care - PPO

## 2019-04-25 ENCOUNTER — Other Ambulatory Visit (HOSPITAL_COMMUNITY)
Admission: RE | Admit: 2019-04-25 | Discharge: 2019-04-25 | Disposition: A | Discharge status: Home / Self Care | Payer: Commercial Managed Care - PPO | Source: Ambulatory Visit | Attending: Internal Medicine | Admitting: Internal Medicine

## 2019-04-25 DIAGNOSIS — Z20828 Contact with and (suspected) exposure to other viral communicable diseases: Secondary | ICD-10-CM | POA: Diagnosis not present

## 2019-04-25 DIAGNOSIS — Z01812 Encounter for preprocedural laboratory examination: Secondary | ICD-10-CM | POA: Insufficient documentation

## 2019-04-26 LAB — NOVEL CORONAVIRUS, NAA (HOSP ORDER, SEND-OUT TO REF LAB; TAT 18-24 HRS): SARS-CoV-2, NAA: NOT DETECTED

## 2019-04-29 ENCOUNTER — Other Ambulatory Visit: Payer: Self-pay

## 2019-04-29 ENCOUNTER — Ambulatory Visit: Payer: Commercial Managed Care - PPO | Admitting: Internal Medicine

## 2019-04-29 DIAGNOSIS — J849 Interstitial pulmonary disease, unspecified: Secondary | ICD-10-CM | POA: Diagnosis not present

## 2019-04-29 LAB — PULMONARY FUNCTION TEST
DL/VA % pred: 110 %
DL/VA: 4.77 ml/min/mmHg/L
DLCO unc % pred: 98 %
DLCO unc: 21.23 ml/min/mmHg
FEF 25-75 Post: 3.6 L/sec
FEF 25-75 Pre: 3.62 L/sec
FEF2575-%Change-Post: 0 %
FEF2575-%Pred-Post: 125 %
FEF2575-%Pred-Pre: 126 %
FEV1-%Change-Post: 0 %
FEV1-%Pred-Post: 95 %
FEV1-%Pred-Pre: 95 %
FEV1-Post: 2.76 L
FEV1-Pre: 2.76 L
FEV1FVC-%Change-Post: 0 %
FEV1FVC-%Pred-Pre: 106 %
FEV6-%Change-Post: 0 %
FEV6-%Pred-Post: 89 %
FEV6-%Pred-Pre: 90 %
FEV6-Post: 3.19 L
FEV6-Pre: 3.21 L
FEV6FVC-%Pred-Post: 102 %
FEV6FVC-%Pred-Pre: 102 %
FVC-%Change-Post: 0 %
FVC-%Pred-Post: 87 %
FVC-%Pred-Pre: 88 %
FVC-Post: 3.19 L
FVC-Pre: 3.21 L
Post FEV1/FVC ratio: 87 %
Post FEV6/FVC ratio: 100 %
Pre FEV1/FVC ratio: 86 %
Pre FEV6/FVC Ratio: 100 %
RV % pred: 67 %
RV: 1.2 L
TLC % pred: 91 %
TLC: 4.71 L

## 2019-04-29 NOTE — Progress Notes (Signed)
Full PFT performed today. °

## 2019-04-30 ENCOUNTER — Telehealth (INDEPENDENT_AMBULATORY_CARE_PROVIDER_SITE_OTHER): Payer: Commercial Managed Care - PPO | Admitting: Internal Medicine

## 2019-04-30 ENCOUNTER — Encounter: Payer: Self-pay | Admitting: Internal Medicine

## 2019-04-30 DIAGNOSIS — J849 Interstitial pulmonary disease, unspecified: Secondary | ICD-10-CM | POA: Diagnosis not present

## 2019-04-30 NOTE — Progress Notes (Signed)
This visit was conducted virtually due to the worsening COVID pandemic.    Synopsis: Referred in Oct 2020 for DOE by Michel Harrow, PA-C  Subjective:   PATIENT ID: Lisa Novak GENDER: female DOB: Jan 31, 1971, MRN: [id number]  Chief Complaint  Patient presents with  . Follow-up    Trial of breo given, will need refill.    HPI Here for f/u of ILD.  History as below.   "48 year old woman with RA on MTX and orencia (antibody) p/w progressive DOE over past several years, worsened in past 6 months.  Extensive cardiac workup revealed no PE, normal echo, and normal CCTA.  Denies cough. +occasional wheezing.  Never hospitalized for breathing.  RA manifests in wrists, elbows, and back and is poorly controlled.  She noted the dyspnea was markedly worse with orencia leading to its discontinuation about 6 weeks ago.  She has had some improvement but not back to baseline.  Never smoker.  Has GERD on PPI, night-time cough worse when she does not take this.  She also finds a correlation with weight and breathing status."   Last visit, - Ordered PFTs ratio 0.81, FEV1 2.9 (95% pred), volumes look okay, DLCO WNL - ILD CT: air trapping and subtle reticular changes at bases - Trial of breo.  Overall feeling very well.  Breathing almost to baseline.  Breo seems to be helping.  She is off all DMARDs but will be trying new Rx soon as her arthritis started to flare requiring initiation of prednisone.  Denies any interim ER visits or hospitalizations.  ROS + symptoms in bold Fevers, chills, weight loss Nausea, vomiting, diarrhea Shortness of breath, wheezing, cough Chest pain, palpitations, lower ext edema   Past Medical History:  Diagnosis Date  . Diabetes type 2, controlled ([place])   . Diverticulitis   . GERD (gastroesophageal reflux disease)   . Hypertension   . PONV (postoperative nausea and vomiting)   . RA (rheumatoid arthritis) ([place])   . SVD (spontaneous vaginal delivery)    x 3  .  Umbilical hernia      Family History  Problem Relation Age of Onset  . COPD Mother      Past Surgical History:  Procedure Laterality Date  . ABDOMINAL HYSTERECTOMY    . ABDOMINOPLASTY    . APPENDECTOMY    . HERNIA REPAIR    . LAPAROSCOPIC APPENDECTOMY N/A 02/11/2014   Procedure: APPENDECTOMY LAPAROSCOPIC;  Surgeon: Autumn Messing III, MD;  Location: [facility];  Service: General;  Laterality: N/A;  . ROBOTIC ASSISTED LAPAROSCOPIC OVARIAN CYSTECTOMY Left 04/13/2014   Procedure: ROBOTIC ASSISTED LAPAROSCOPIC Left OVARIAN CYSTECTOMY, BILATERAL SALPINGECTOMY,lysis of adhesions;  Surgeon: Princess Bruins, MD;  Location: [facility];  Service: Gynecology;  Laterality: Left;  . WISDOM TOOTH EXTRACTION      Social History   Socioeconomic History  . Marital status: Married    Spouse name: Not on file  . Number of children: Not on file  . Years of education: Not on file  . Highest education level: Not on file  Occupational History  . Not on file  Social Needs  . Financial resource strain: Not on file  . Food insecurity    Worry: Not on file    Inability: Not on file  . Transportation needs    Medical: Not on file    Non-medical: Not on file  Tobacco Use  . Smoking status: Never Smoker  . Smokeless tobacco: Never Used  Substance and Sexual Activity  .  Alcohol use: No  . Drug use: No  . Sexual activity: Yes    Birth control/protection: Condom, Surgical    Comment: Hysterectony  Lifestyle  . Physical activity    Days per week: Not on file    Minutes per session: Not on file  . Stress: Not on file  Relationships  . Social Herbalist on phone: Not on file    Gets together: Not on file    Attends religious service: Not on file    Active member of club or organization: Not on file    Attends meetings of clubs or organizations: Not on file    Relationship status: Not on file  . Intimate partner violence    Fear of current or ex partner: Not on file    Emotionally abused: Not  on file    Physically abused: Not on file    Forced sexual activity: Not on file  Other Topics Concern  . Not on file  Social History Narrative  . Not on file     No Known Allergies   Outpatient Medications Prior to Visit  Medication Sig Dispense Refill  . calcium carbonate (TUMS - DOSED IN MG ELEMENTAL CALCIUM) 500 MG chewable tablet Chew 2 tablets by mouth daily as needed for indigestion or heartburn. Reported on 08/17/2015    . fluticasone furoate-vilanterol (BREO ELLIPTA) 100-25 MCG/INH AEPB Inhale 1 puff into the lungs daily. 38 each 11  . fluticasone furoate-vilanterol (BREO ELLIPTA) 100-25 MCG/INH AEPB Inhale 1 puff into the lungs daily. 1 each 0  . FOLIC ACID PO Take by mouth. 2 TABLE QD.    . lansoprazole (PREVACID) 15 MG capsule Take 15 mg by mouth daily at 12 noon.    Marland Kitchen losartan-hydrochlorothiazide (HYZAAR) 50-12.5 MG per tablet Take 1 tablet by mouth daily.    Marland Kitchen omeprazole (PRILOSEC) 20 MG capsule Take 1 capsule (20 mg total) by mouth daily. 90 capsule 3  . spironolactone (ALDACTONE) 100 MG tablet daily.    Marland Kitchen abatacept (ORENCIA) 250 MG injection Inject into the vein once a week.    . methotrexate (RHEUMATREX) 2.5 MG tablet Takes 6 tablets weekly.     No facility-administered medications prior to visit.     Objective:  Speaking in full sentences Good insight  There were no vitals filed for this visit.   on RA BMI Readings from Last 3 Encounters:  03/06/19 30.32 kg/m  01/27/19 29.79 kg/m  10/01/16 28.29 kg/m   Wt Readings from Last 3 Encounters:  03/06/19 179 lb 6.4 oz (81.4 kg)  01/27/19 176 lb 4 oz (79.9 kg)  10/01/16 170 lb (77.1 kg)     CBC    Component Value Date/Time   WBC 9.1 01/27/2019 1130   WBC 13.0 (H) 07/28/2018 2244   RBC 4.71 01/27/2019 1130   RBC 4.49 07/28/2018 2244   HGB 14.5 01/27/2019 1130   HCT 42.1 01/27/2019 1130   PLT 280 01/27/2019 1130   MCV 89 01/27/2019 1130   MCH 30.8 01/27/2019 1130   MCH 30.7 07/28/2018 2244   MCHC  34.4 01/27/2019 1130   MCHC 33.7 07/28/2018 2244   RDW 13.5 01/27/2019 1130   LYMPHSABS 1.2 02/11/2014 1035   MONOABS 0.7 02/11/2014 1035   EOSABS 0.1 02/11/2014 1035   BASOSABS 0.0 02/11/2014 1035     Chest Imaging: Read as normal, looks like mosaicism to me, no fibrosis  Pulmonary Functions Testing Results: PFT Results Latest Ref Rng & Units 04/29/2019  FVC-Pre L 3.21  FVC-Predicted Pre % 88  FVC-Post L 3.19  FVC-Predicted Post % 87  Pre FEV1/FVC % % 86  Post FEV1/FCV % % 87  FEV1-Pre L 2.76  FEV1-Predicted Pre % 95  FEV1-Post L 2.76  DLCO UNC% % 98  DLCO COR %Predicted % 110  TLC L 4.71  TLC % Predicted % 91  RV % Predicted % 67    FeNO: none  Pathology: none  Echocardiogram: Sept 2020 WNL  Heart Catheterization: none    Assessment & Plan:  # RA- associated vs. DMARD-associated ILD- PFTs preserved, mild on CT.  My suspicion given onset is that it is either orencia or MTX-associated.  Improved with breo and cessation of these meds.  # GERD  Discussion: - Continue breo and PPI - Cautious trial of another class of DMARD is reasonable per patient rheumatologist - Check PFTs and ILD protocol CT in 6 months f/u with me to discuss   Current Outpatient Medications:  .  calcium carbonate (TUMS - DOSED IN MG ELEMENTAL CALCIUM) 500 MG chewable tablet, Chew 2 tablets by mouth daily as needed for indigestion or heartburn. Reported on 08/17/2015, Disp: , Rfl:  .  fluticasone furoate-vilanterol (BREO ELLIPTA) 100-25 MCG/INH AEPB, Inhale 1 puff into the lungs daily., Disp: 38 each, Rfl: 11 .  fluticasone furoate-vilanterol (BREO ELLIPTA) 100-25 MCG/INH AEPB, Inhale 1 puff into the lungs daily., Disp: 1 each, Rfl: 0 .  FOLIC ACID PO, Take by mouth. 2 TABLE QD., Disp: , Rfl:  .  lansoprazole (PREVACID) 15 MG capsule, Take 15 mg by mouth daily at 12 noon., Disp: , Rfl:  .  losartan-hydrochlorothiazide (HYZAAR) 50-12.5 MG per tablet, Take 1 tablet by mouth daily., Disp: , Rfl:   .  omeprazole (PRILOSEC) 20 MG capsule, Take 1 capsule (20 mg total) by mouth daily., Disp: 90 capsule, Rfl: 3 .  spironolactone (ALDACTONE) 100 MG tablet, daily., Disp: , Rfl:  .  abatacept (ORENCIA) 250 MG injection, Inject into the vein once a week., Disp: , Rfl:    Candee Furbish, MD [facility] 04/30/2019 11:17 AM

## 2019-09-09 ENCOUNTER — Other Ambulatory Visit: Payer: Self-pay | Admitting: Obstetrics and Gynecology

## 2019-09-09 DIAGNOSIS — Z1231 Encounter for screening mammogram for malignant neoplasm of breast: Secondary | ICD-10-CM

## 2019-09-30 ENCOUNTER — Other Ambulatory Visit: Payer: Self-pay | Admitting: Internal Medicine

## 2019-09-30 ENCOUNTER — Other Ambulatory Visit: Payer: Self-pay

## 2019-09-30 ENCOUNTER — Ambulatory Visit (INDEPENDENT_AMBULATORY_CARE_PROVIDER_SITE_OTHER)
Admission: RE | Admit: 2019-09-30 | Discharge: 2019-09-30 | Disposition: A | Discharge status: Home / Self Care | Payer: Commercial Managed Care - PPO | Source: Ambulatory Visit | Attending: Internal Medicine | Admitting: Internal Medicine

## 2019-09-30 DIAGNOSIS — J849 Interstitial pulmonary disease, unspecified: Secondary | ICD-10-CM | POA: Diagnosis not present

## 2019-09-30 IMAGING — CT CT CHEST HIGH RESOLUTION W/O CM
2 of 7 series · 14 of 36 positions shown, 17 images · non-contrast
Comparison: 03/09/2019 and 09/11/2016.

CLINICAL DATA: Rheumatoid arthritis, interstitial lung disease.

EXAM:
CT CHEST WITHOUT CONTRAST
TECHNIQUE: Multidetector CT imaging of the chest was performed following the
standard protocol without intravenous contrast. High resolution
imaging of the lungs, as well as inspiratory and expiratory imaging,
was performed.

[Series 4: high resolution · axial · 0.68mm/px · z∈[-280,-46]mm · 11 of 281 slices shown, 14 images]
[im 24/281  mediastinal]
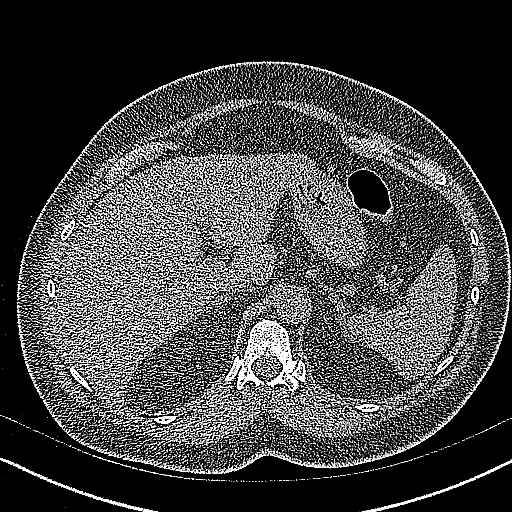
[im 24/281  lung]
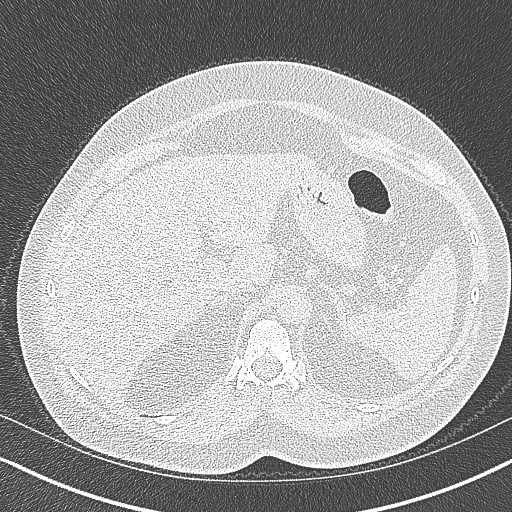
[im 47/281  lung]
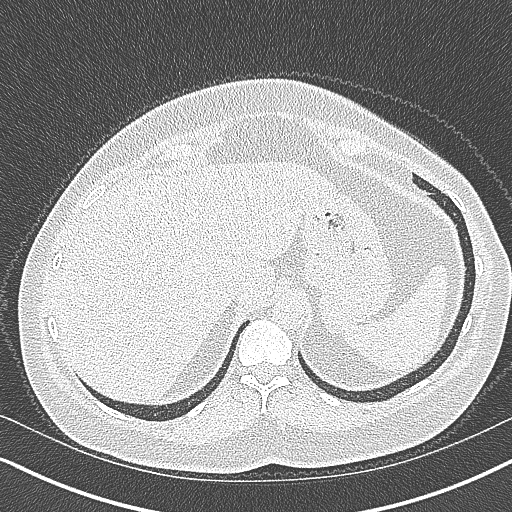
[im 71/281  lung]
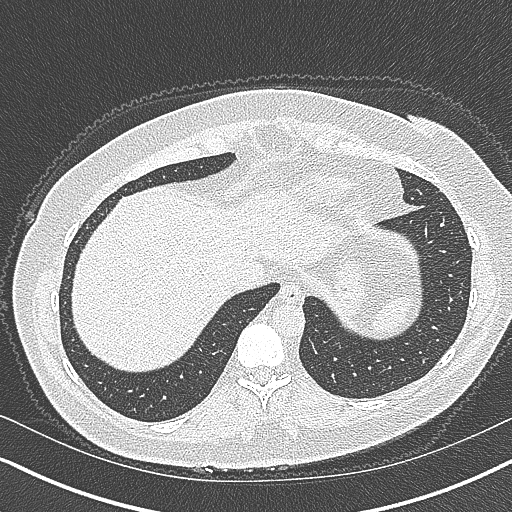
[im 94/281  lung]
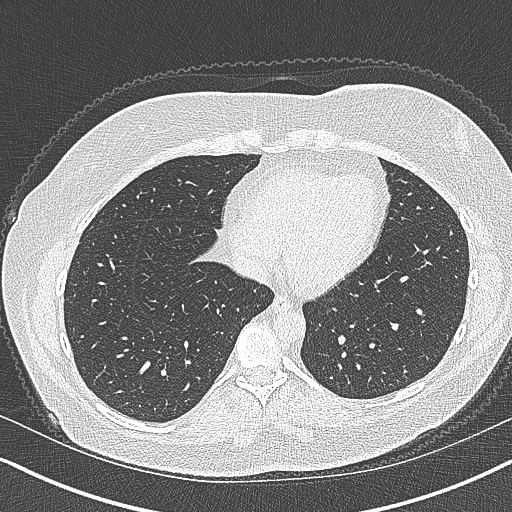
[im 117/281  mediastinal]
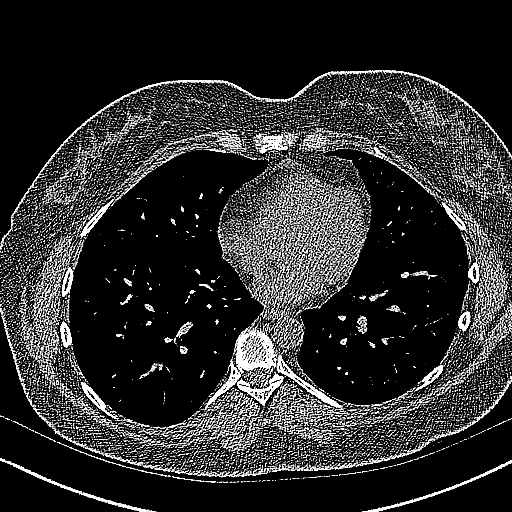
[im 117/281  lung]
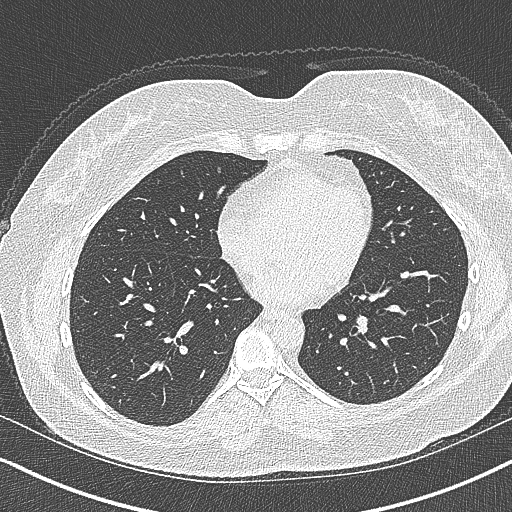
[im 141/281  lung]
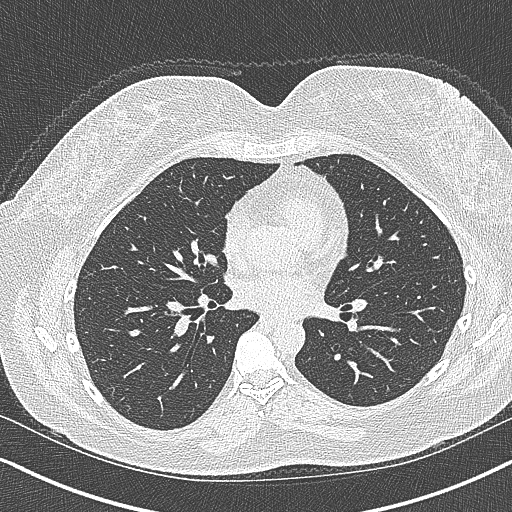
[im 164/281  lung]
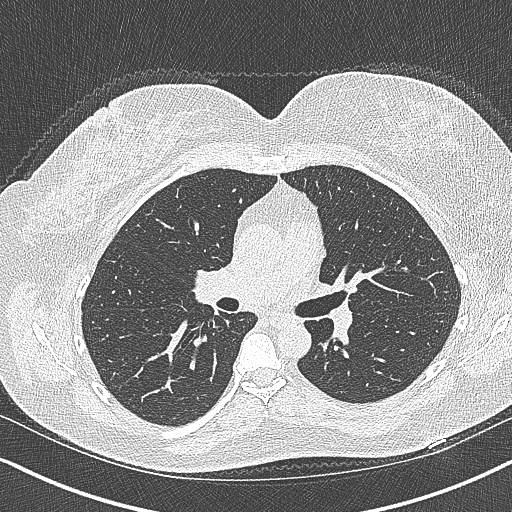
[im 187/281  lung]
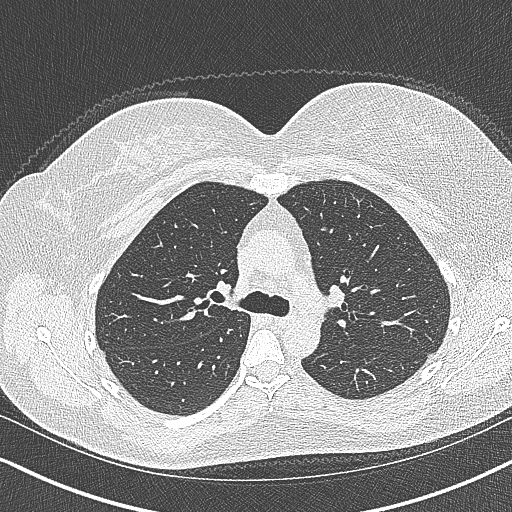
[im 211/281  mediastinal]
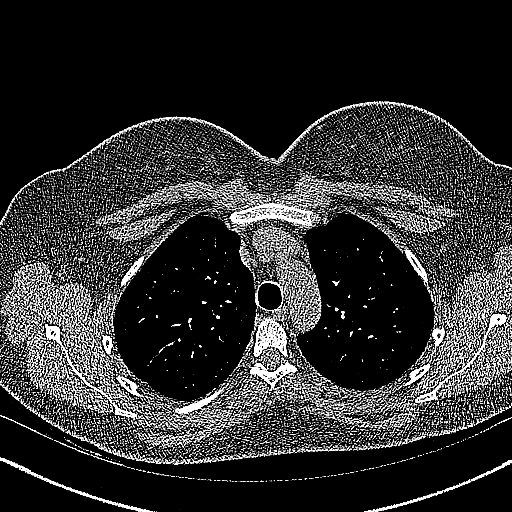
[im 211/281  lung]
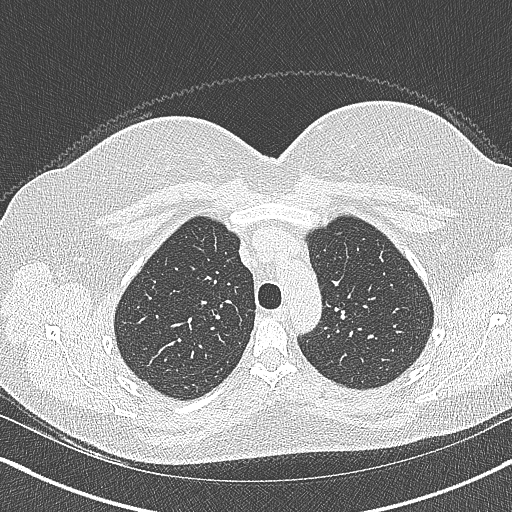
[im 234/281  lung]
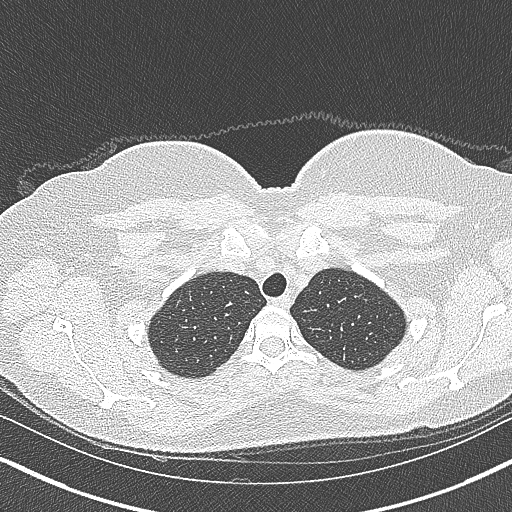
[im 257/281  lung]
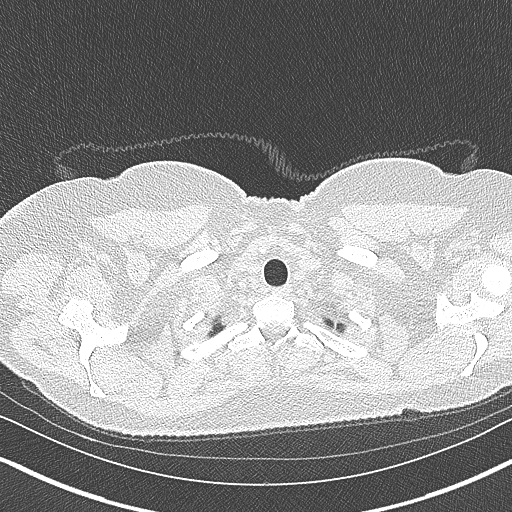

[Series 6: coronal · coronal · 0.59mm/px · 3 of 120 slices shown]
[im 24/120  lung]
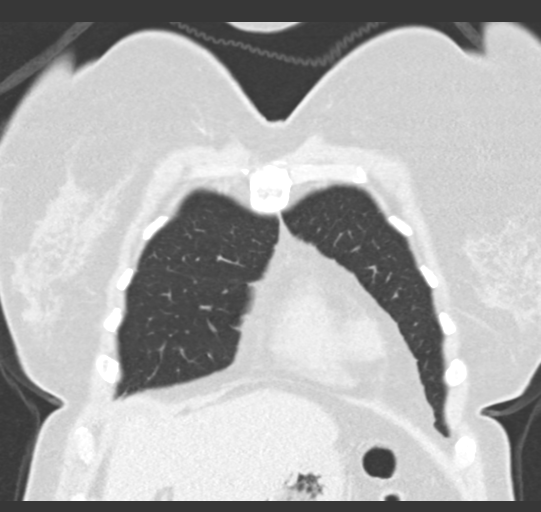
[im 48/120  lung]
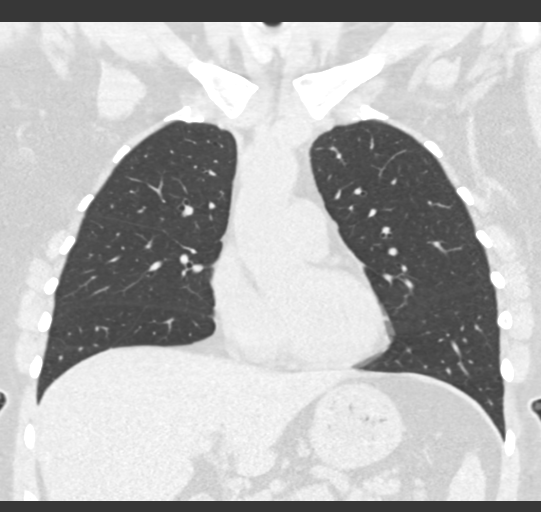
[im 72/120  lung]
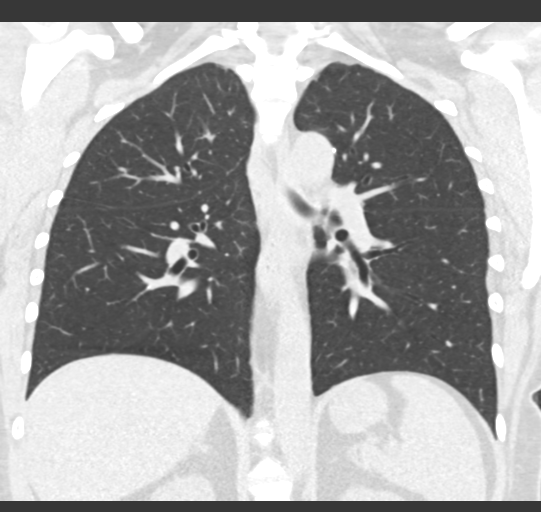

[14 of 36 positions shown; findings below may reference images not displayed]

FINDINGS: Cardiovascular: Vascular structures are unremarkable. Heart size
normal. No pericardial effusion.

Mediastinum/Nodes: No pathologically enlarged mediastinal or
axillary lymph nodes. Hilar regions are difficult to evaluate
without IV contrast but appear grossly unremarkable. Esophagus is
grossly unremarkable.

Lungs/Pleura: Mild pleuroparenchymal scarring in the apices
bilaterally. Mild central bronchiectasis. Negative for subpleural
reticulation, traction bronchiectasis/bronchiolectasis,
ground-glass, architectural distortion or honeycombing. No pleural
fluid. Airway is unremarkable.

Upper Abdomen: Visualized portions of the liver, adrenal glands,
kidneys, spleen, pancreas, stomach and bowel are grossly
unremarkable.

Musculoskeletal: None.
IMPRESSION: 1. Negative for interstitial lung disease. Subpleural reticular
densities in the posterolateral aspects of both lower lobes,
questioned on 03/09/2019, are not visualized today.
2. Mild central bronchiectasis.

## 2019-10-07 ENCOUNTER — Other Ambulatory Visit: Payer: Self-pay | Admitting: Obstetrics and Gynecology

## 2019-10-07 DIAGNOSIS — N6310 Unspecified lump in the right breast, unspecified quadrant: Secondary | ICD-10-CM

## 2019-10-20 ENCOUNTER — Ambulatory Visit
Admission: RE | Admit: 2019-10-20 | Discharge: 2019-10-20 | Disposition: A | Discharge status: Home / Self Care | Payer: Commercial Managed Care - PPO | Source: Ambulatory Visit | Attending: Obstetrics and Gynecology | Admitting: Obstetrics and Gynecology

## 2019-10-20 ENCOUNTER — Other Ambulatory Visit: Payer: Self-pay

## 2019-10-20 DIAGNOSIS — N6310 Unspecified lump in the right breast, unspecified quadrant: Secondary | ICD-10-CM

## 2019-10-20 IMAGING — US US BREAST*R* LIMITED INC AXILLA
1 series · 12 of 17 positions shown · non-contrast
Comparison: Previous exam(s).
COMPARISON: Previous exam(s).

Addendum:
CLINICAL DATA: Patient presents for palpable abnormality within the
right breast.

EXAM:
DIGITAL DIAGNOSTIC RIGHT MAMMOGRAM WITH CAD AND TOMO
ULTRASOUND RIGHT BREAST

[Series 1: us breast*right* limited inc axilla · 0.07mm/px · 12 of 17 slices shown]
[im 1/17]
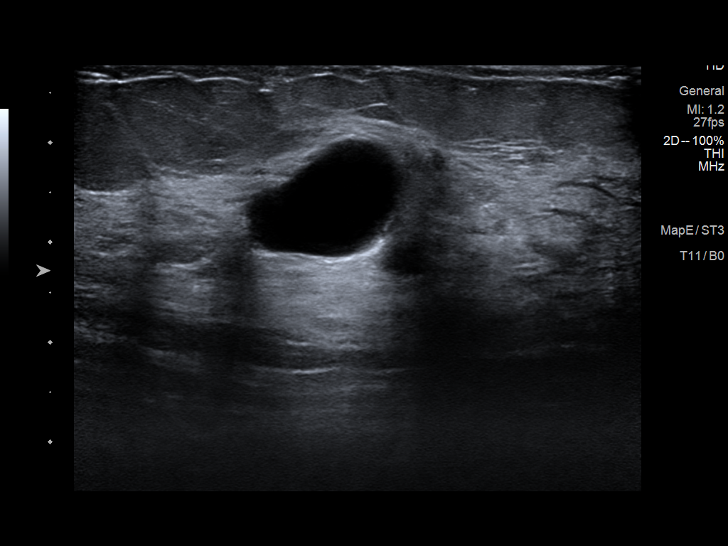
[im 3/17]
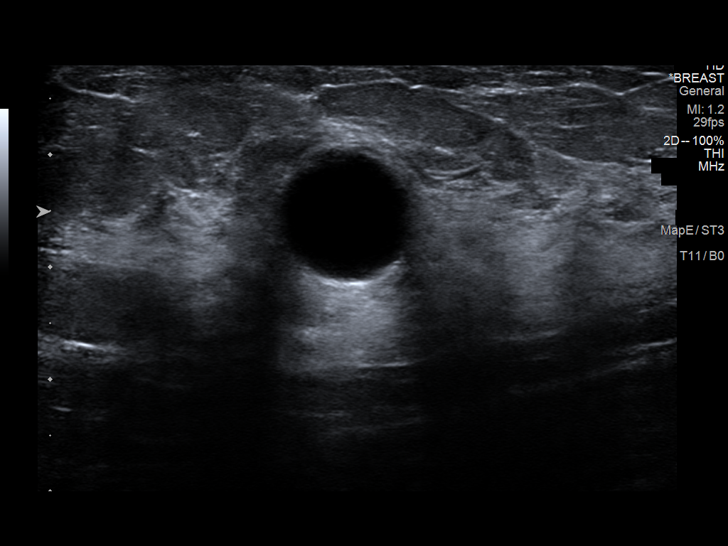
[im 4/17]
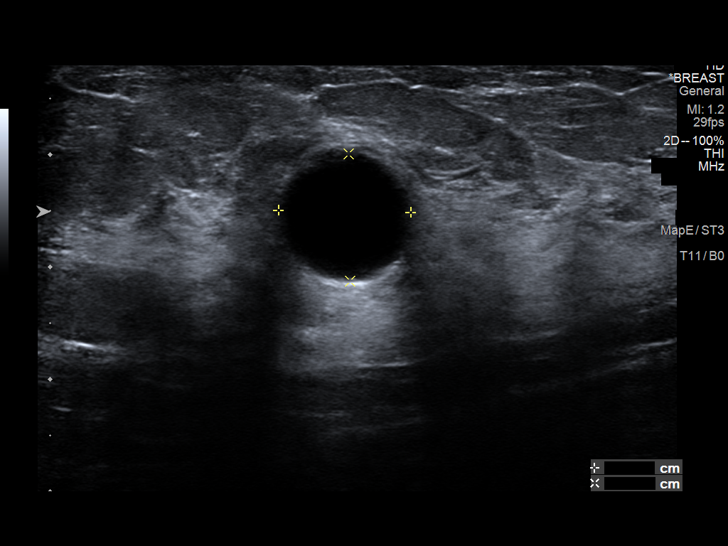
[im 5/17]
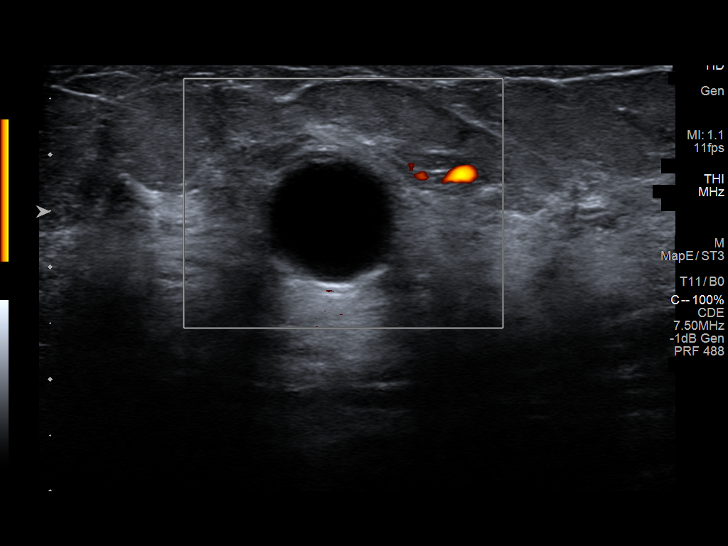
[im 7/17]
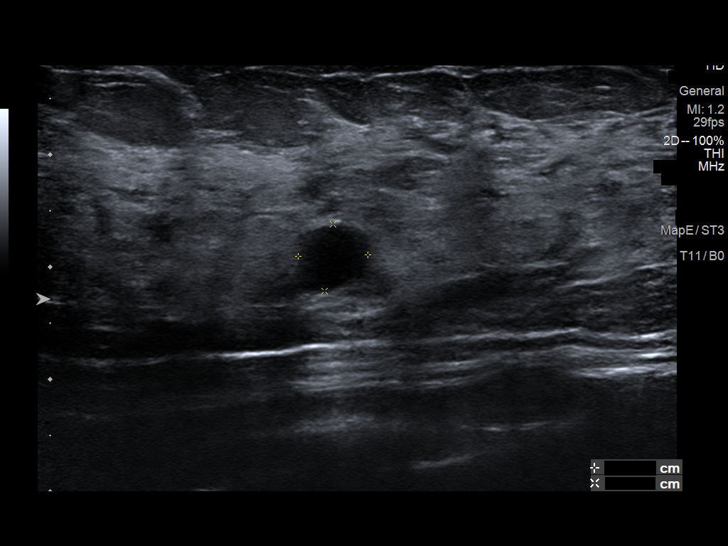
[im 8/17]
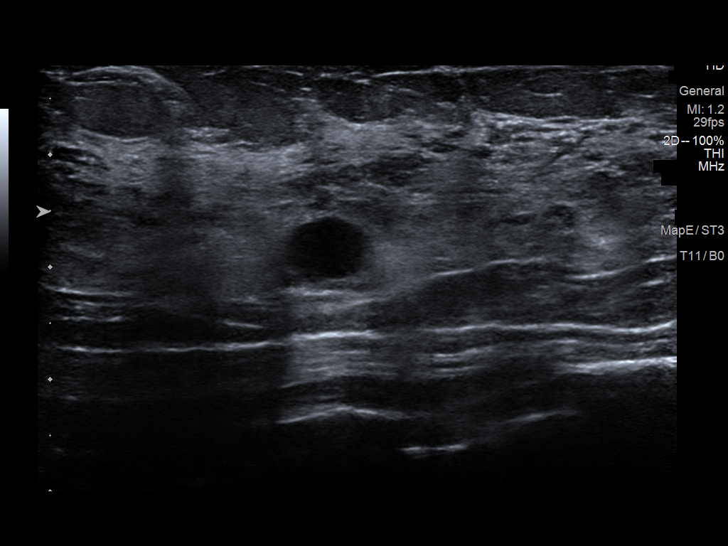
[im 10/17]
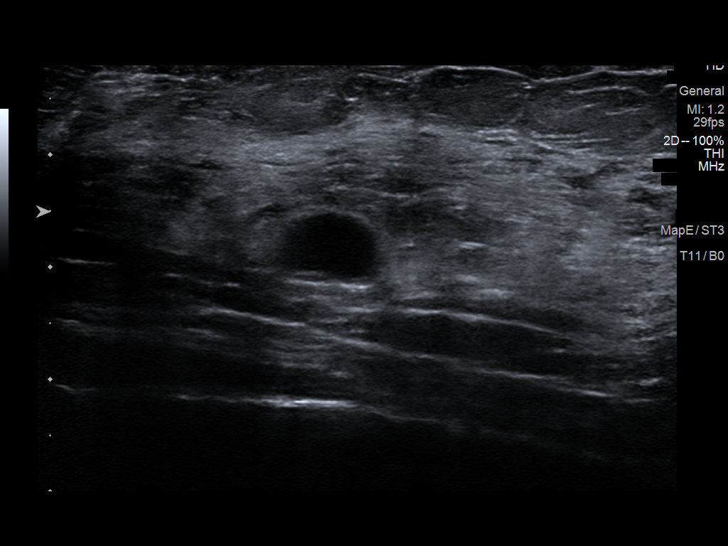
[im 11/17]
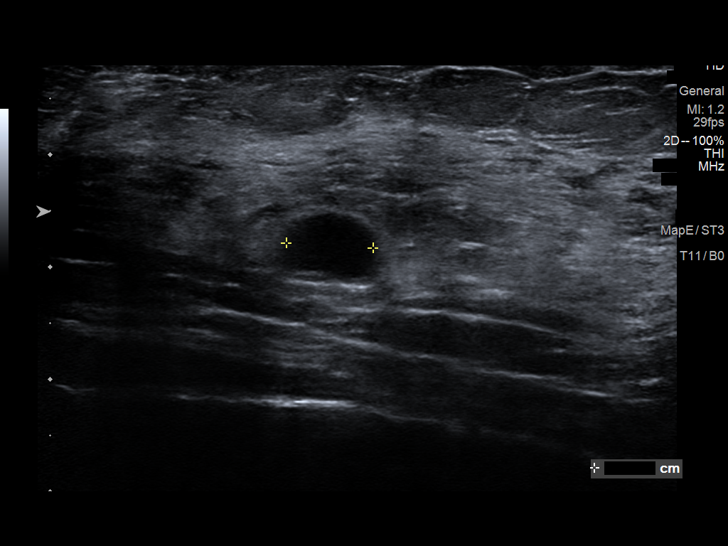
[im 13/17]
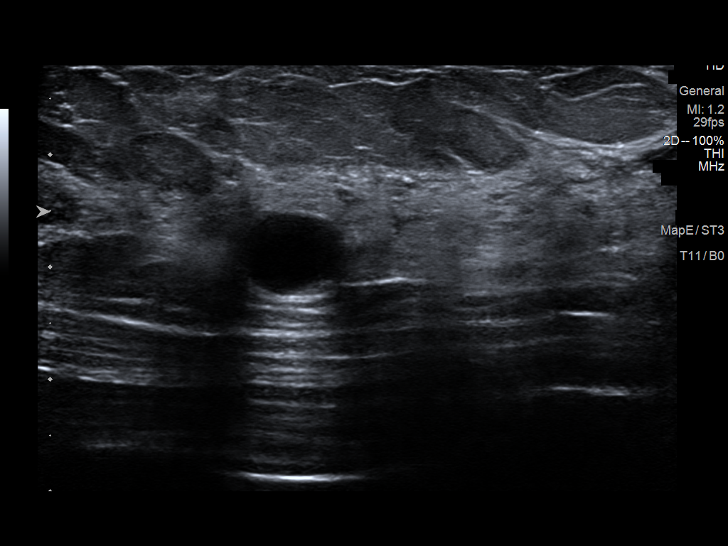
[im 14/17]
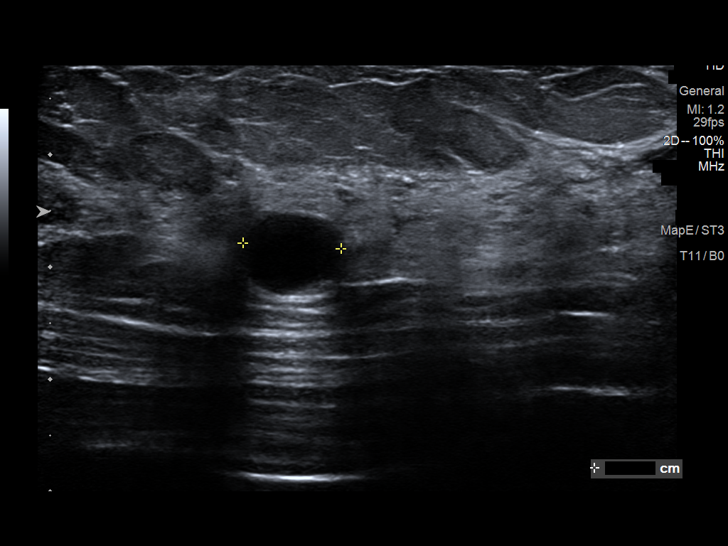
[im 15/17]
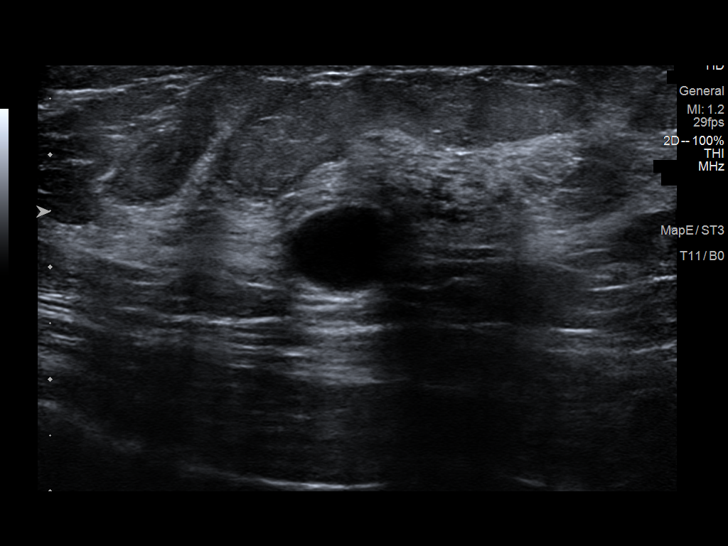
[im 17/17]
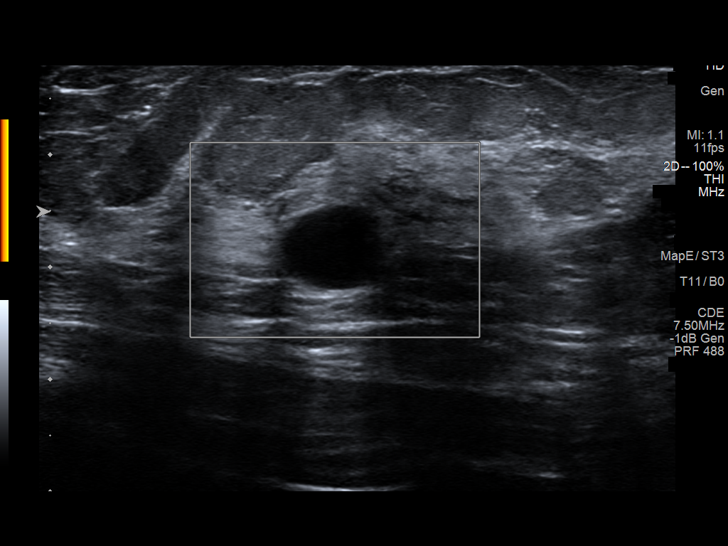

[12 of 17 positions shown; findings below may reference images not displayed]

ACR Breast Density Category c: The breast tissue is heterogeneously
dense, which may obscure small masses.
FINDINGS: Underlying the palpable marker within the superior anterior right
breast is an oval circumscribed mass. Additionally there is an oval
circumscribed mass within the outer aspect of the right breast
posterior depth. No additional masses, calcifications or distortion
identified.

Mammographic images were processed with CAD.

Targeted ultrasound is performed, showing a 1.6 x 1.2 x 1.3 cm cyst
right breast 12 o'clock position 3 cm from nipple at the site of
palpable concern.

Within the right breast 9 o'clock position 4 cm from nipple there is
a 0.6 x 0.6 x 0.8 cm cyst.

Within the right breast 10 o'clock position 6 cm from nipple there
is a 0.9 x 0.8 x 0.9 cm cyst.
IMPRESSION: Palpable abnormality right breast compatible with a simple cyst.

Additional simple cysts within the right breast.

No mammographic evidence for malignancy.

RECOMMENDATION:
Continued clinical evaluation for right breast palpable area of
concern.

Screening mammogram in one year.(Code:S1-Z-9AW)

I have discussed the findings and recommendations with the patient.
If applicable, a reminder letter will be sent to the patient
regarding the next appointment.

BI-RADS CATEGORY  2: Benign.

ADDENDUM:
Addendum for correction of recommendation:

Return to annual screening mammography [DATE].

*** End of Addendum ***
ACR Breast Density Category c: The breast tissue is heterogeneously
dense, which may obscure small masses.
FINDINGS: Underlying the palpable marker within the superior anterior right
breast is an oval circumscribed mass. Additionally there is an oval
circumscribed mass within the outer aspect of the right breast
posterior depth. No additional masses, calcifications or distortion
identified.

Mammographic images were processed with CAD.

Targeted ultrasound is performed, showing a 1.6 x 1.2 x 1.3 cm cyst
right breast 12 o'clock position 3 cm from nipple at the site of
palpable concern.

Within the right breast 9 o'clock position 4 cm from nipple there is
a 0.6 x 0.6 x 0.8 cm cyst.

Within the right breast 10 o'clock position 6 cm from nipple there
is a 0.9 x 0.8 x 0.9 cm cyst.
IMPRESSION: Palpable abnormality right breast compatible with a simple cyst.

Additional simple cysts within the right breast.

No mammographic evidence for malignancy.

RECOMMENDATION:
Continued clinical evaluation for right breast palpable area of
concern.

Screening mammogram in one year.(Code:S1-Z-9AW)

I have discussed the findings and recommendations with the patient.
If applicable, a reminder letter will be sent to the patient
regarding the next appointment.

BI-RADS CATEGORY  2: Benign.

## 2019-10-20 IMAGING — MG MM DIGITAL DIAGNOSTIC UNILAT*R* W/ TOMO W/ CAD
6 series · 6 of 18 positions shown · non-contrast
Comparison: Previous exam(s).
COMPARISON: Previous exam(s).

Addendum:
CLINICAL DATA: Patient presents for palpable abnormality within the
right breast.

EXAM:
DIGITAL DIAGNOSTIC RIGHT MAMMOGRAM WITH CAD AND TOMO
ULTRASOUND RIGHT BREAST

[R MLO synth-2D (1 of 2)]
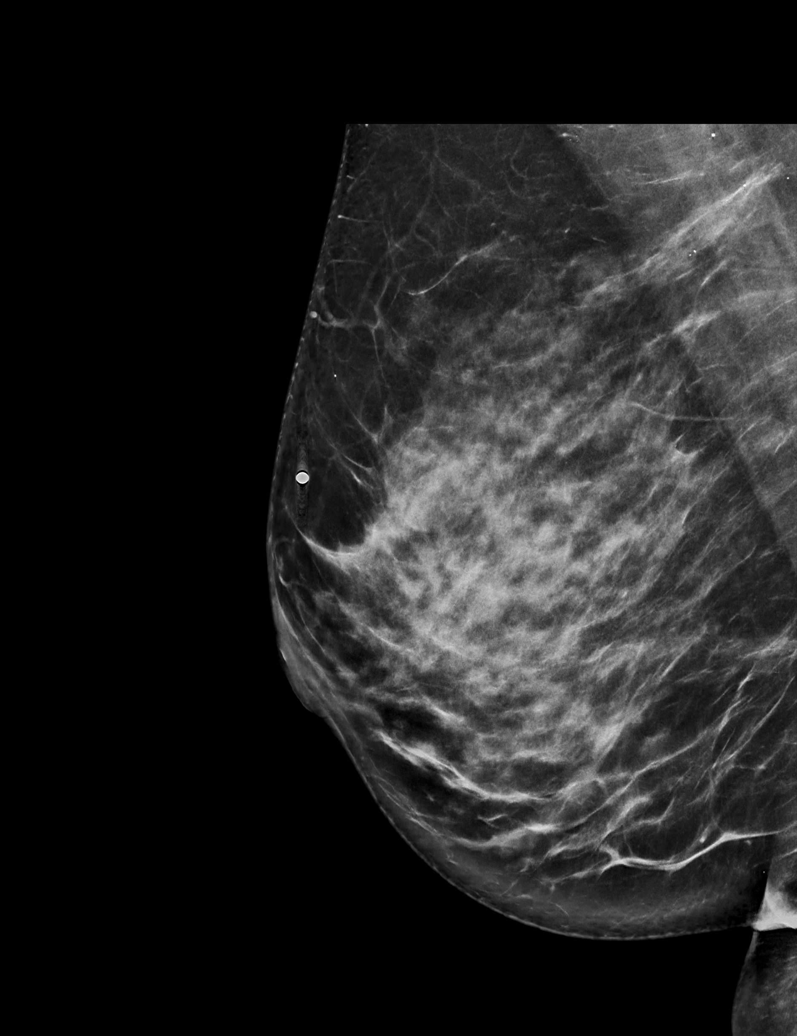

[R CC synth-2D]
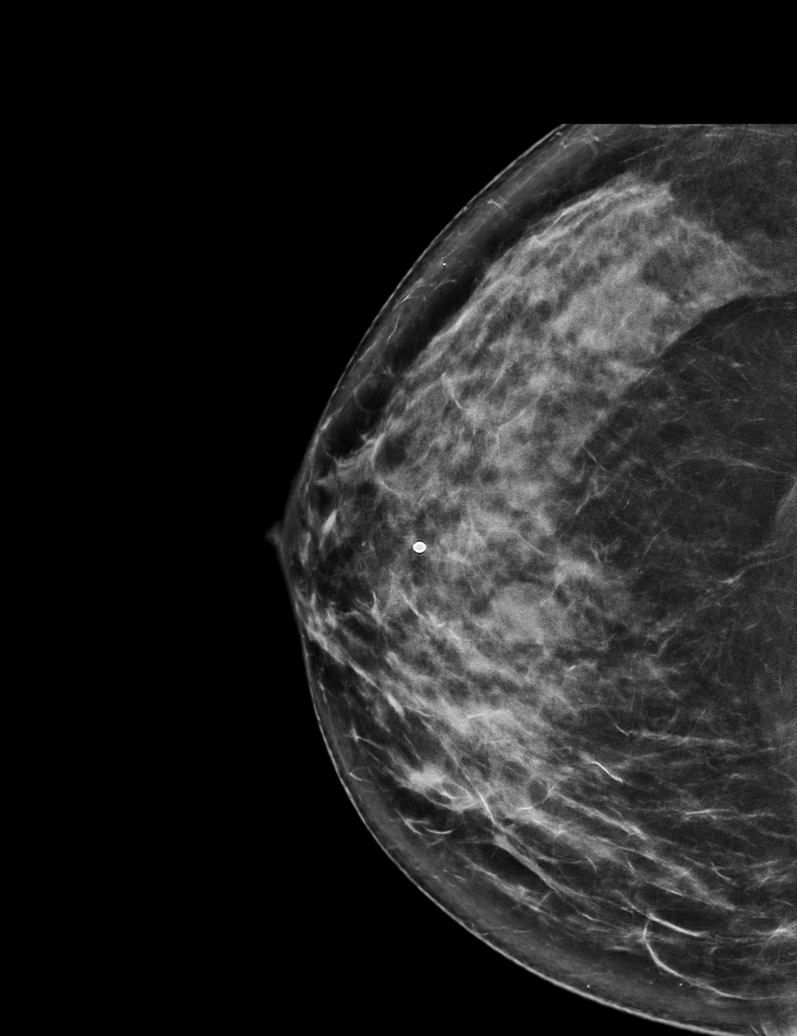

[R MLO synth-2D (2 of 2)]
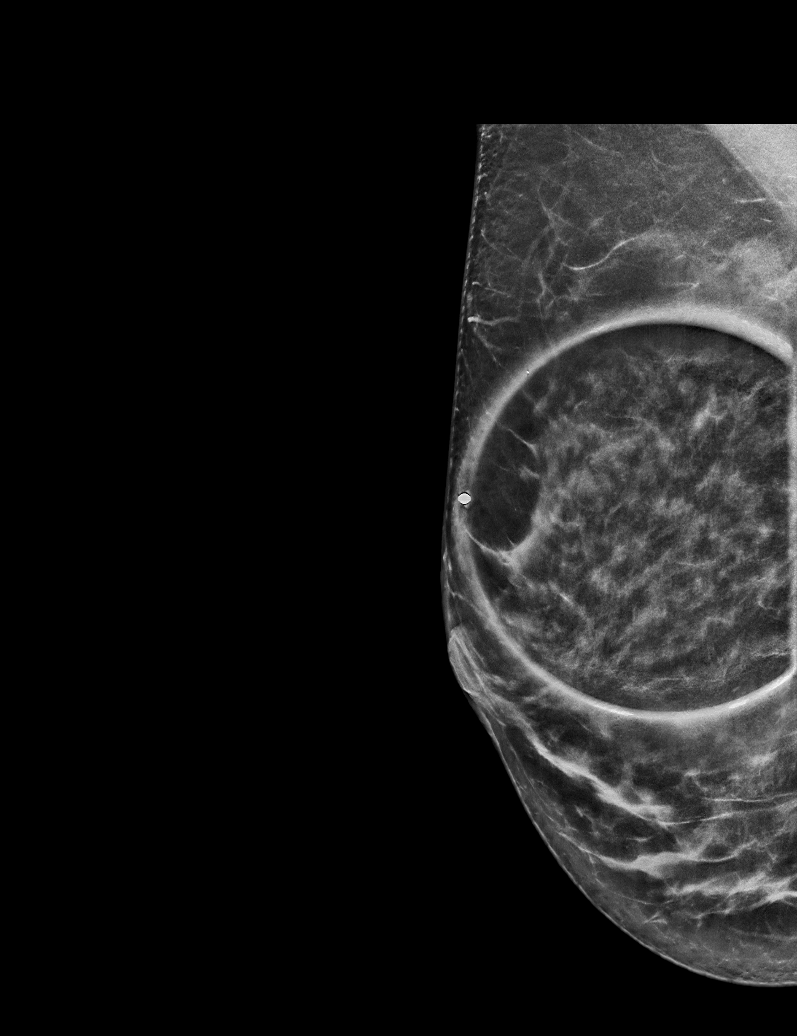

[R CC tomo · tomo slice 39/77.0]
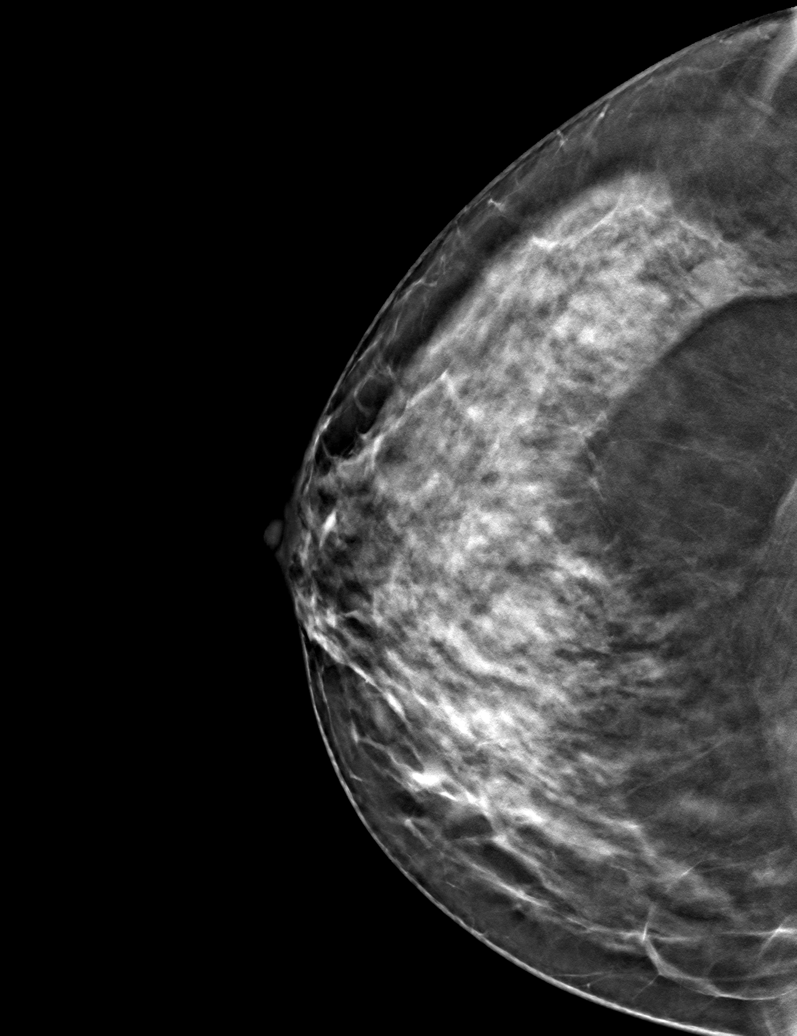

[R MLO tomo (1 of 2) · tomo slice 41/82.0]
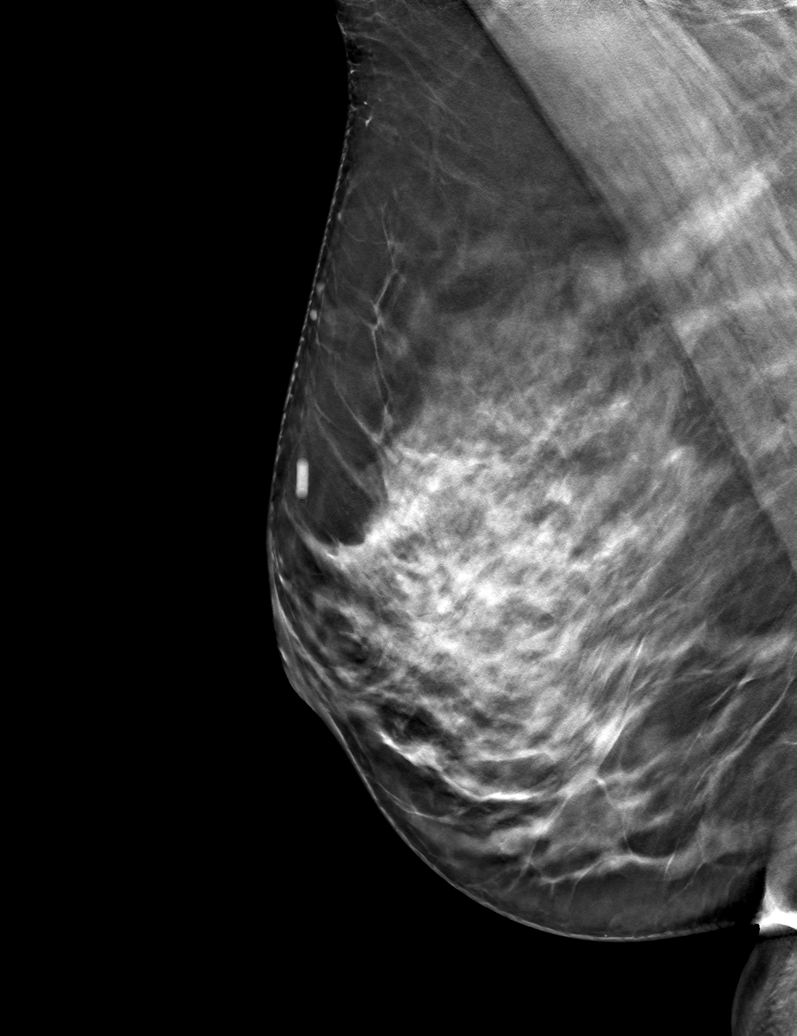

[R MLO tomo (2 of 2) · tomo slice 35/70.0]
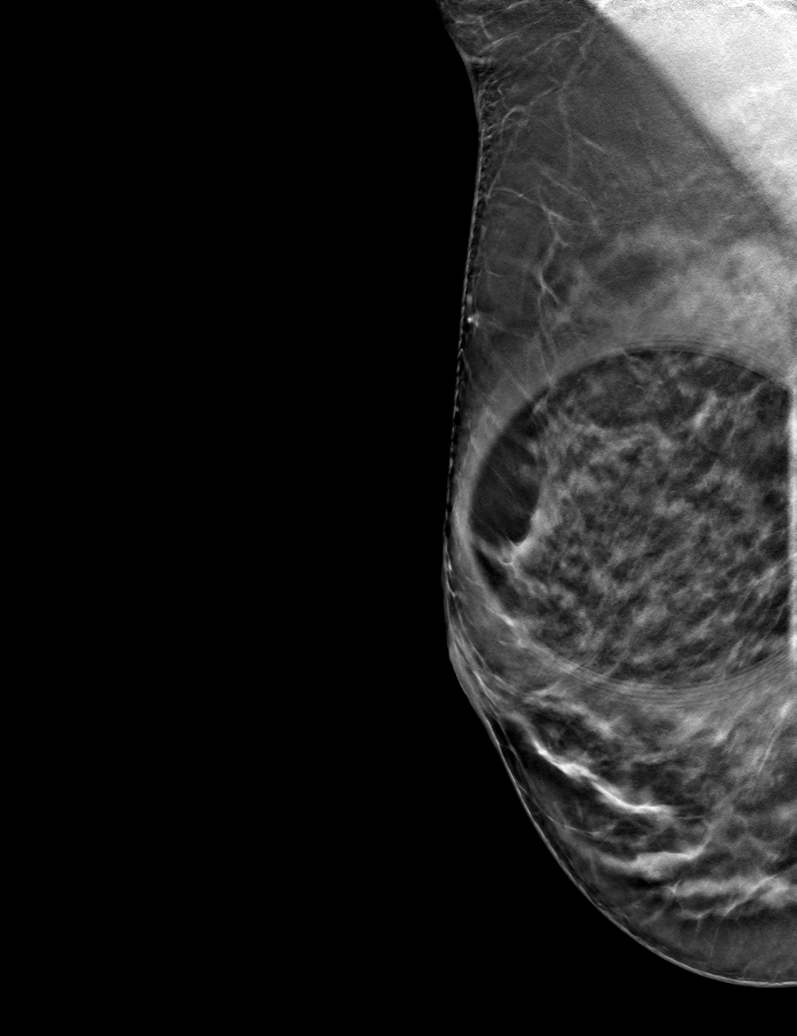

[6 of 18 positions shown; findings below may reference images not displayed]

ACR Breast Density Category c: The breast tissue is heterogeneously
dense, which may obscure small masses.
FINDINGS: Underlying the palpable marker within the superior anterior right
breast is an oval circumscribed mass. Additionally there is an oval
circumscribed mass within the outer aspect of the right breast
posterior depth. No additional masses, calcifications or distortion
identified.

Mammographic images were processed with CAD.

Targeted ultrasound is performed, showing a 1.6 x 1.2 x 1.3 cm cyst
right breast 12 o'clock position 3 cm from nipple at the site of
palpable concern.

Within the right breast 9 o'clock position 4 cm from nipple there is
a 0.6 x 0.6 x 0.8 cm cyst.

Within the right breast 10 o'clock position 6 cm from nipple there
is a 0.9 x 0.8 x 0.9 cm cyst.
IMPRESSION: Palpable abnormality right breast compatible with a simple cyst.

Additional simple cysts within the right breast.

No mammographic evidence for malignancy.

RECOMMENDATION:
Continued clinical evaluation for right breast palpable area of
concern.

Screening mammogram in one year.(Code:S1-Z-9AW)

I have discussed the findings and recommendations with the patient.
If applicable, a reminder letter will be sent to the patient
regarding the next appointment.

BI-RADS CATEGORY  2: Benign.

ADDENDUM:
Addendum for correction of recommendation:

Return to annual screening mammography [DATE].

*** End of Addendum ***
ACR Breast Density Category c: The breast tissue is heterogeneously
dense, which may obscure small masses.
FINDINGS: Underlying the palpable marker within the superior anterior right
breast is an oval circumscribed mass. Additionally there is an oval
circumscribed mass within the outer aspect of the right breast
posterior depth. No additional masses, calcifications or distortion
identified.

Mammographic images were processed with CAD.

Targeted ultrasound is performed, showing a 1.6 x 1.2 x 1.3 cm cyst
right breast 12 o'clock position 3 cm from nipple at the site of
palpable concern.

Within the right breast 9 o'clock position 4 cm from nipple there is
a 0.6 x 0.6 x 0.8 cm cyst.

Within the right breast 10 o'clock position 6 cm from nipple there
is a 0.9 x 0.8 x 0.9 cm cyst.
IMPRESSION: Palpable abnormality right breast compatible with a simple cyst.

Additional simple cysts within the right breast.

No mammographic evidence for malignancy.

RECOMMENDATION:
Continued clinical evaluation for right breast palpable area of
concern.

Screening mammogram in one year.(Code:S1-Z-9AW)

I have discussed the findings and recommendations with the patient.
If applicable, a reminder letter will be sent to the patient
regarding the next appointment.

BI-RADS CATEGORY  2: Benign.

## 2019-11-09 ENCOUNTER — Other Ambulatory Visit: Payer: Self-pay

## 2019-11-09 ENCOUNTER — Ambulatory Visit (INDEPENDENT_AMBULATORY_CARE_PROVIDER_SITE_OTHER): Payer: Commercial Managed Care - PPO | Admitting: Internal Medicine

## 2019-11-09 ENCOUNTER — Ambulatory Visit
Admission: RE | Admit: 2019-11-09 | Discharge: 2019-11-09 | Disposition: A | Discharge status: Home / Self Care | Payer: Commercial Managed Care - PPO | Source: Ambulatory Visit | Attending: Obstetrics and Gynecology | Admitting: Obstetrics and Gynecology

## 2019-11-09 DIAGNOSIS — R9389 Abnormal findings on diagnostic imaging of other specified body structures: Secondary | ICD-10-CM | POA: Diagnosis not present

## 2019-11-09 DIAGNOSIS — Z1231 Encounter for screening mammogram for malignant neoplasm of breast: Secondary | ICD-10-CM

## 2019-11-09 IMAGING — MG DIGITAL SCREENING BILAT W/ TOMO W/ CAD
8 series · 8 of 24 positions shown · non-contrast
Comparison: Previous exam(s).

CLINICAL DATA: Screening.

EXAM:
DIGITAL SCREENING BILATERAL MAMMOGRAM WITH TOMO AND CAD

[L MLO synth-2D]
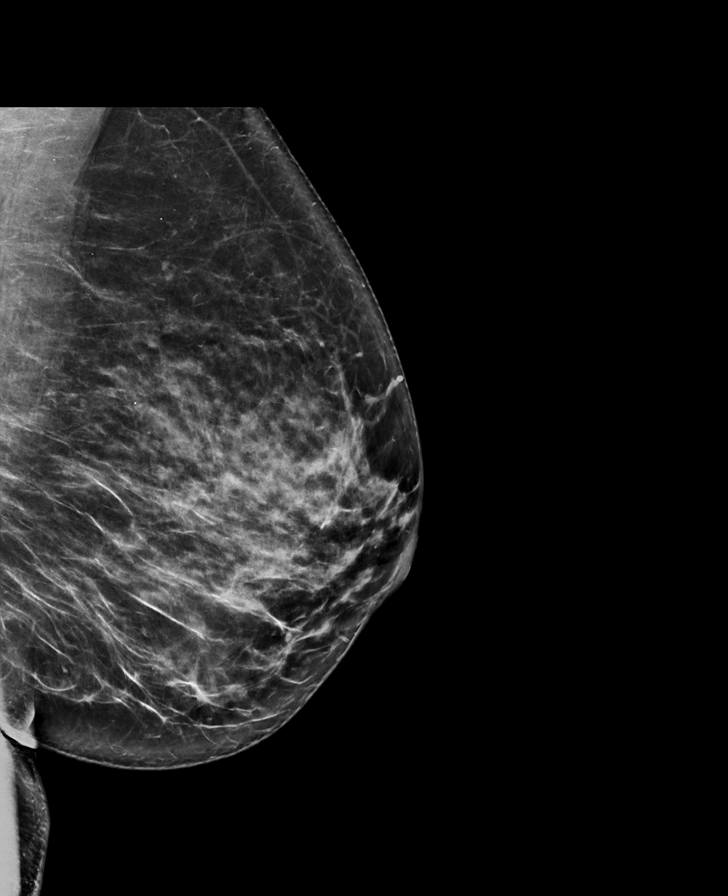

[L CC synth-2D]
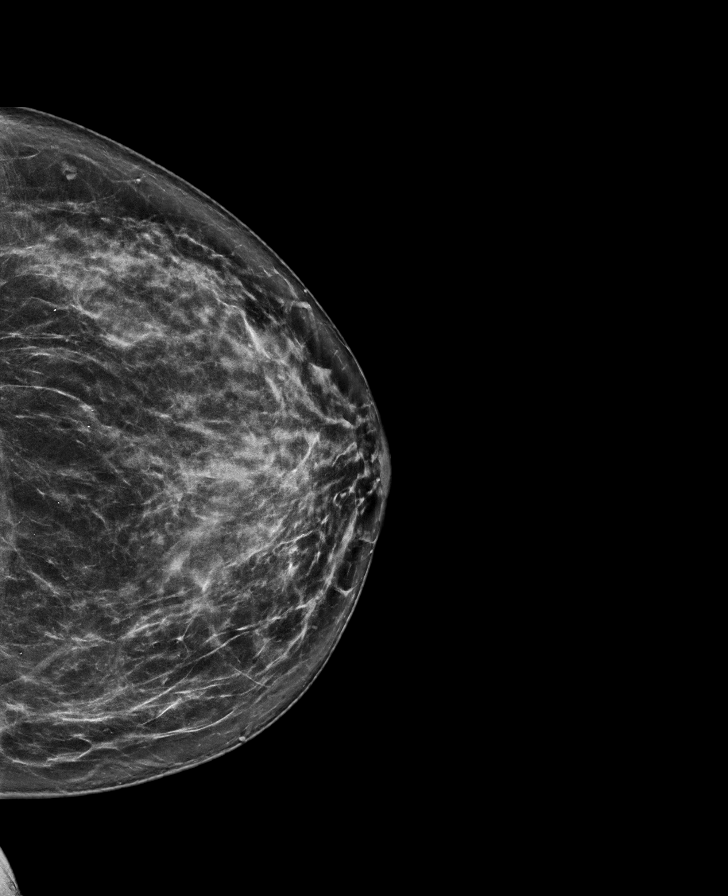

[R MLO synth-2D]
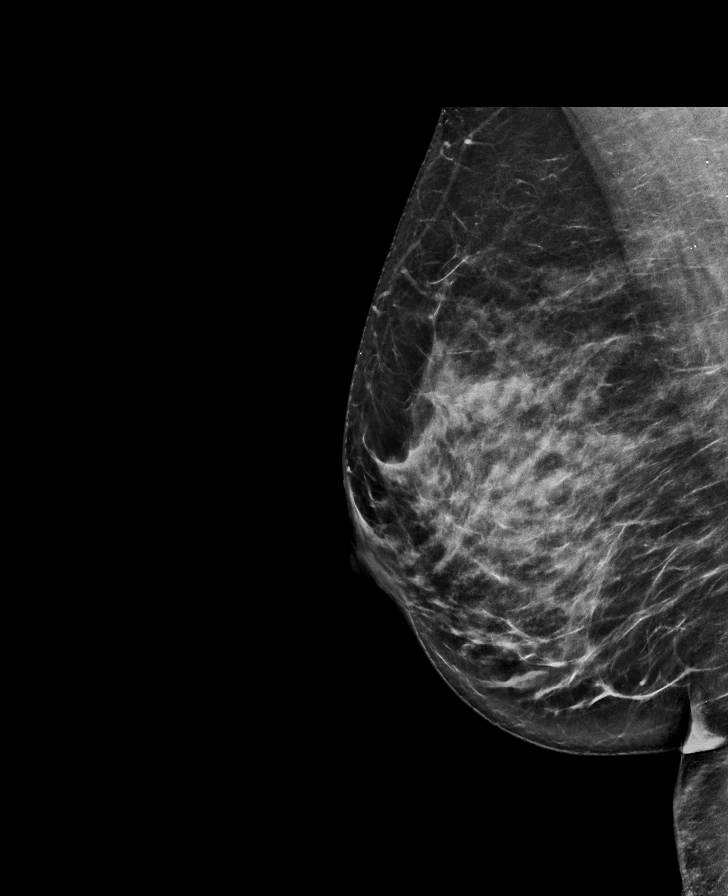

[R CC synth-2D]
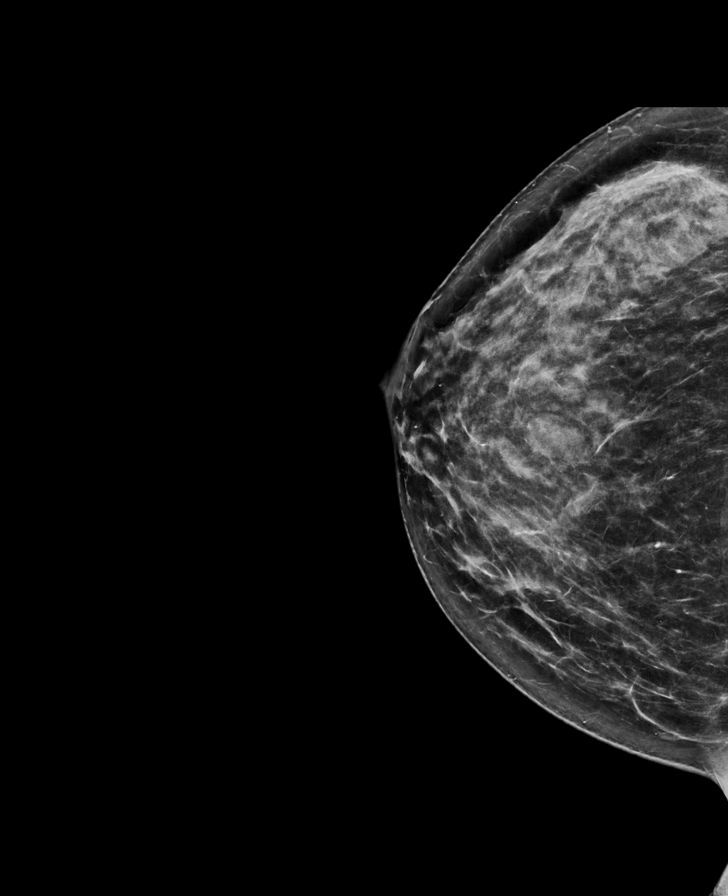

[R CC tomo · tomo slice 37/73.0]
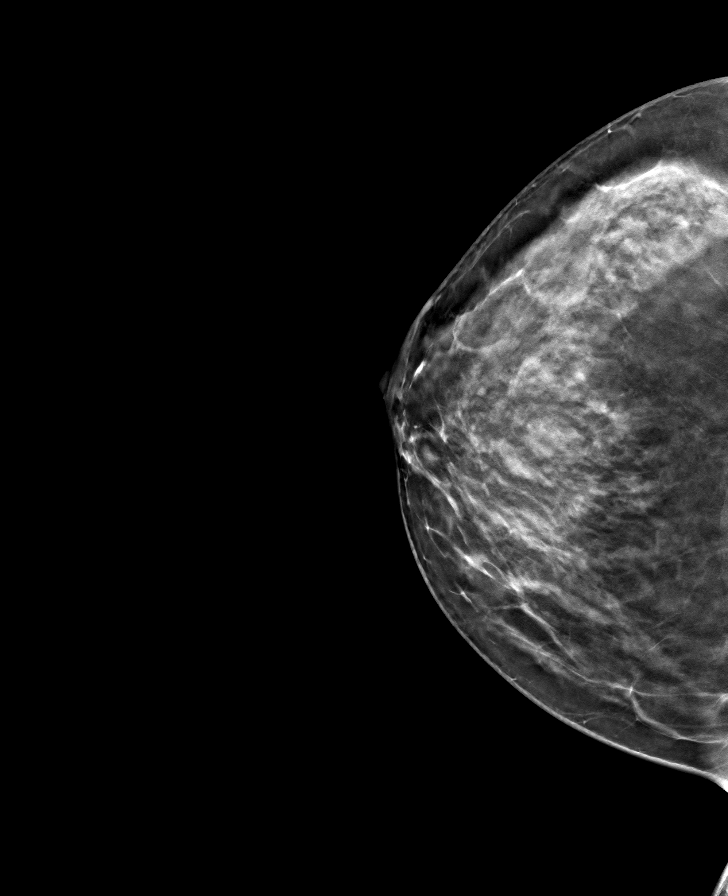

[L CC tomo · tomo slice 41/80.0]
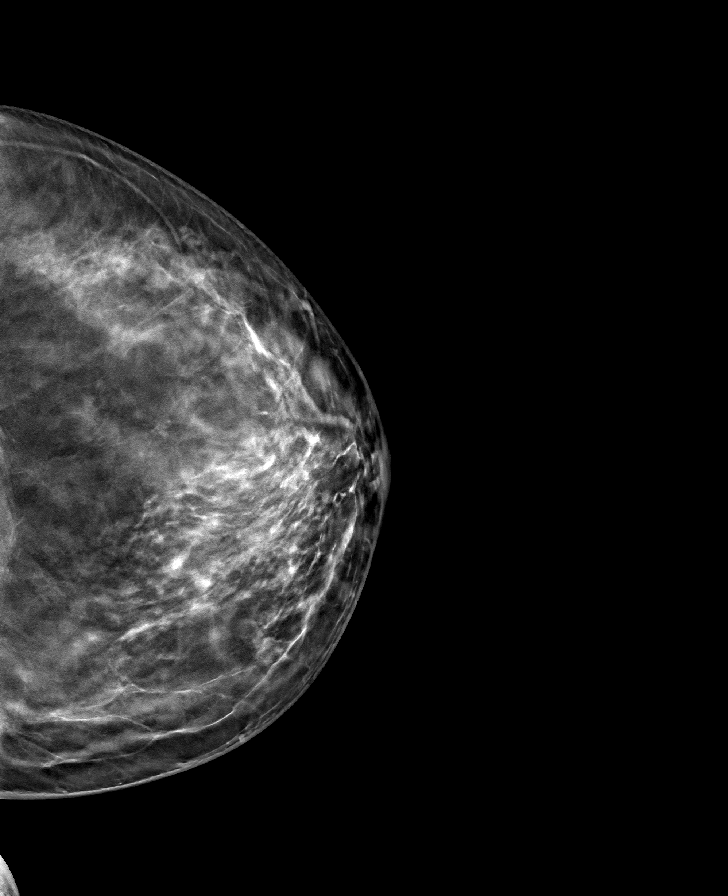

[R MLO tomo · tomo slice 41/80.0]
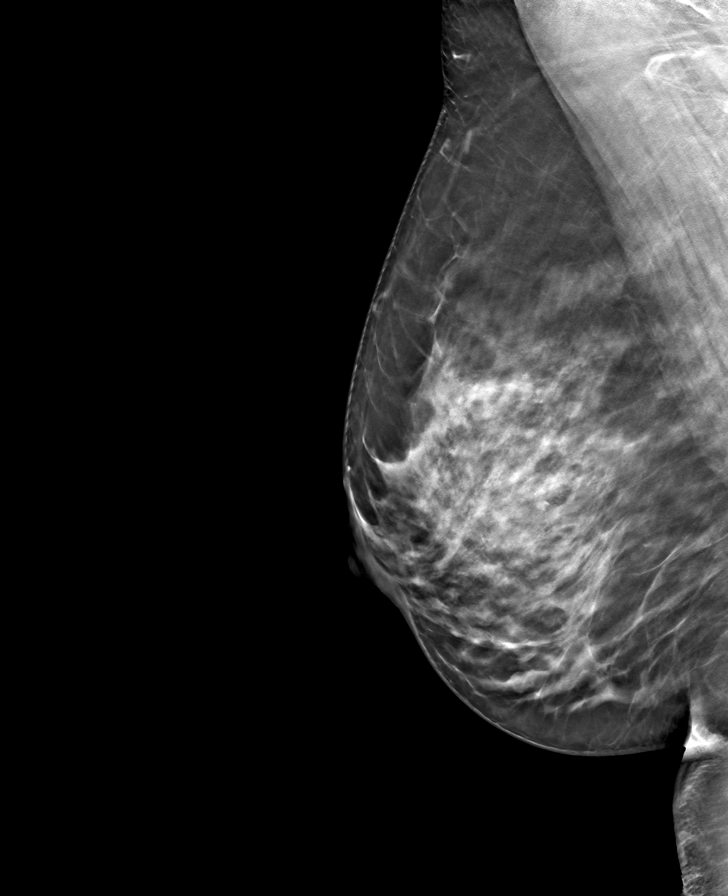

[L MLO tomo · tomo slice 41/82.0]
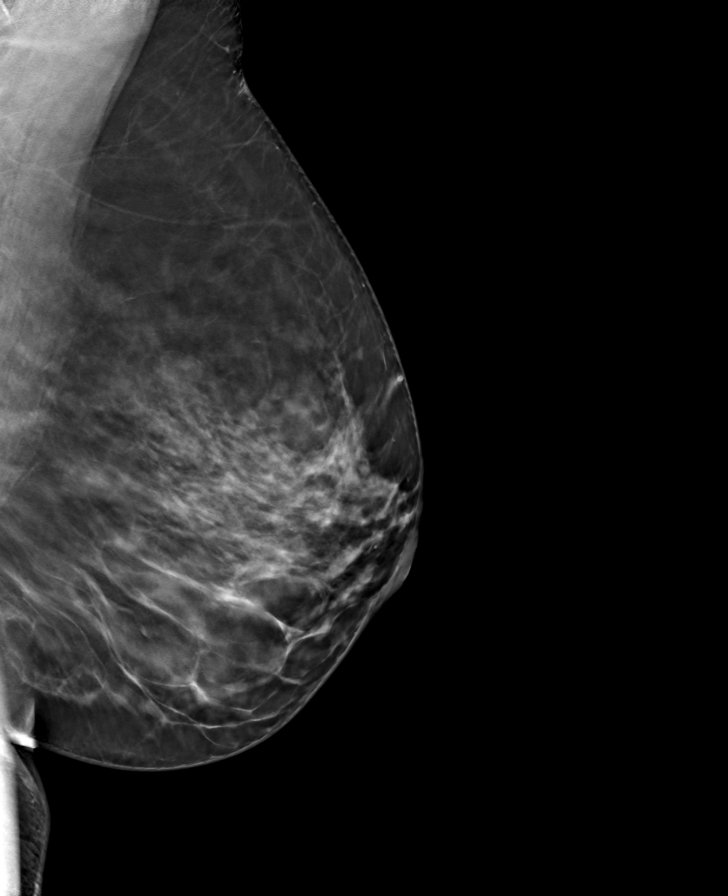

[8 of 24 positions shown; findings below may reference images not displayed]

ACR Breast Density Category c: The breast tissue is heterogeneously
dense, which may obscure small masses.
FINDINGS: There are no findings suspicious for malignancy. Images were
processed with CAD.
IMPRESSION: No mammographic evidence of malignancy. A result letter of this
screening mammogram will be mailed directly to the patient.

RECOMMENDATION:
Screening mammogram in one year. (Code:[id number])

BI-RADS CATEGORY  1: Negative.

## 2019-11-09 MED ORDER — OMEPRAZOLE 20 MG PO CPDR: 20.0000 mg | DELAYED_RELEASE_CAPSULE | Freq: Every day | ORAL | 3 refills | Status: DC

## 2019-11-09 MED ORDER — BREO ELLIPTA 100-25 MCG/INH IN AEPB: 1.0000 | INHALATION_SPRAY | Freq: Every day | RESPIRATORY_TRACT | 11 refills | Status: DC

## 2019-11-09 NOTE — Progress Notes (Signed)
Phone visit.    Synopsis: Referred in Oct 2020 for DOE by Michel Harrow, PA-C  Subjective:   PATIENT ID: Lisa Novak GENDER: female DOB: [birth year], MRN: [id number]   HPI Here for f/u of possible ILD.  History as below.   "49 year old woman with RA on MTX and orencia (antibody) p/w progressive DOE over past several years, worsened in past 6 months.  Extensive cardiac workup revealed no PE, normal echo, and normal CCTA.  Denies cough. +occasional wheezing.  Never hospitalized for breathing.  RA manifests in wrists, elbows, and back and is poorly controlled.  She noted the dyspnea was markedly worse with orencia leading to its discontinuation about 6 weeks ago.  She has had some improvement but not back to baseline.  Never smoker.  Has GERD on PPI, night-time cough worse when she does not take this.  She also finds a correlation with weight and breathing status."  Her repeat CT shows resolution of the air trapping and no signs of ILD indicating the previous CT findings were likely from the MTX. Patient is doing great with breo and omeprazole. MMRC 0.  No ER visits or hospitalizations  ROS + symptoms in bold Fevers, chills, weight loss Nausea, vomiting, diarrhea Shortness of breath, wheezing, cough Chest pain, palpitations, lower ext edema   Past Medical History:  Diagnosis Date  . Diabetes type 2, controlled ([place])   . Diverticulitis   . GERD (gastroesophageal reflux disease)   . Hypertension   . PONV (postoperative nausea and vomiting)   . RA (rheumatoid arthritis) ([place])   . SVD (spontaneous vaginal delivery)    x 3  . Umbilical hernia      Family History  Problem Relation Age of Onset  . COPD Mother      Past Surgical History:  Procedure Laterality Date  . ABDOMINAL HYSTERECTOMY    . ABDOMINOPLASTY    . APPENDECTOMY    . HERNIA REPAIR    . LAPAROSCOPIC APPENDECTOMY N/A 02/11/2014   Procedure: APPENDECTOMY LAPAROSCOPIC;  Surgeon: Autumn Messing III, MD;   Location: [facility];  Service: General;  Laterality: N/A;  . ROBOTIC ASSISTED LAPAROSCOPIC OVARIAN CYSTECTOMY Left 04/13/2014   Procedure: ROBOTIC ASSISTED LAPAROSCOPIC Left OVARIAN CYSTECTOMY, BILATERAL SALPINGECTOMY,lysis of adhesions;  Surgeon: Princess Bruins, MD;  Location: [facility];  Service: Gynecology;  Laterality: Left;  . WISDOM TOOTH EXTRACTION      Social History   Socioeconomic History  . Marital status: Married    Spouse name: Not on file  . Number of children: Not on file  . Years of education: Not on file  . Highest education level: Not on file  Occupational History  . Not on file  Tobacco Use  . Smoking status: Never Smoker  . Smokeless tobacco: Never Used  Substance and Sexual Activity  . Alcohol use: No  . Drug use: No  . Sexual activity: Yes    Birth control/protection: Condom, Surgical    Comment: Hysterectony  Other Topics Concern  . Not on file  Social History Narrative  . Not on file   Social Determinants of Health   Financial Resource Strain:   . Difficulty of Paying Living Expenses:   Food Insecurity:   . Worried About Charity fundraiser in the Last Year:   . Arboriculturist in the Last Year:   Transportation Needs:   . Film/video editor (Medical):   Marland Kitchen Lack of Transportation (Non-Medical):   Physical Activity:   .  Days of Exercise per Week:   . Minutes of Exercise per Session:   Stress:   . Feeling of Stress :   Social Connections:   . Frequency of Communication with Friends and Family:   . Frequency of Social Gatherings with Friends and Family:   . Attends Religious Services:   . Active Member of Clubs or Organizations:   . Attends Archivist Meetings:   Marland Kitchen Marital Status:   Intimate Partner Violence:   . Fear of Current or Ex-Partner:   . Emotionally Abused:   Marland Kitchen Physically Abused:   . Sexually Abused:      No Known Allergies   Outpatient Medications Prior to Visit  Medication Sig Dispense Refill  . abatacept  (ORENCIA) 250 MG injection Inject into the vein once a week.    . calcium carbonate (TUMS - DOSED IN MG ELEMENTAL CALCIUM) 500 MG chewable tablet Chew 2 tablets by mouth daily as needed for indigestion or heartburn. Reported on 08/17/2015    . FOLIC ACID PO Take by mouth. 2 TABLE QD.    Marland Kitchen losartan-hydrochlorothiazide (HYZAAR) 50-12.5 MG per tablet Take 1 tablet by mouth daily.    Marland Kitchen spironolactone (ALDACTONE) 100 MG tablet daily.    . fluticasone furoate-vilanterol (BREO ELLIPTA) 100-25 MCG/INH AEPB Inhale 1 puff into the lungs daily. 38 each 11  . fluticasone furoate-vilanterol (BREO ELLIPTA) 100-25 MCG/INH AEPB Inhale 1 puff into the lungs daily. 1 each 0  . lansoprazole (PREVACID) 15 MG capsule Take 15 mg by mouth daily at 12 noon.    Marland Kitchen omeprazole (PRILOSEC) 20 MG capsule Take 1 capsule (20 mg total) by mouth daily. 90 capsule 3   No facility-administered medications prior to visit.    Objective:  Speaking in full sentences Good insight  There were no vitals filed for this visit.   on RA BMI Readings from Last 3 Encounters:  03/06/19 30.32 kg/m  01/27/19 29.79 kg/m  10/01/16 28.29 kg/m   Wt Readings from Last 3 Encounters:  03/06/19 179 lb 6.4 oz (81.4 kg)  01/27/19 176 lb 4 oz (79.9 kg)  10/01/16 170 lb (77.1 kg)     CBC    Component Value Date/Time   WBC 9.1 01/27/2019 1130   WBC 13.0 (H) 07/28/2018 2244   RBC 4.71 01/27/2019 1130   RBC 4.49 07/28/2018 2244   HGB 14.5 01/27/2019 1130   HCT 42.1 01/27/2019 1130   PLT 280 01/27/2019 1130   MCV 89 01/27/2019 1130   MCH 30.8 01/27/2019 1130   MCH 30.7 07/28/2018 2244   MCHC 34.4 01/27/2019 1130   MCHC 33.7 07/28/2018 2244   RDW 13.5 01/27/2019 1130   LYMPHSABS 1.2 02/11/2014 1035   MONOABS 0.7 02/11/2014 1035   EOSABS 0.1 02/11/2014 1035   BASOSABS 0.0 02/11/2014 1035     Chest Imaging: Read as normal, looks like mosaicism to me, no fibrosis  Pulmonary Functions Testing Results: PFT Results Latest Ref Rng &  Units 04/29/2019  FVC-Pre L 3.21  FVC-Predicted Pre % 88  FVC-Post L 3.19  FVC-Predicted Post % 87  Pre FEV1/FVC % % 86  Post FEV1/FCV % % 87  FEV1-Pre L 2.76  FEV1-Predicted Pre % 95  FEV1-Post L 2.76  DLCO UNC% % 98  DLCO COR %Predicted % 110  TLC L 4.71  TLC % Predicted % 91  RV % Predicted % 67    FeNO: none  Pathology: none  Echocardiogram: Sept 2020 WNL  Heart Catheterization: none  Repeat  CT 09/30/19 IMPRESSION: 1. Negative for interstitial lung disease. Subpleural reticular densities in the posterolateral aspects of both lower lobes, questioned on 03/09/2019, are not visualized today. 2. Mild central bronchiectasis.    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  Resolved on repeat CT and by symptoms.  # GERD- controlled with PPI  Discussion: - Continue breo and PPI - Patient can f/u with our office PRN if breathing worsens   Current Outpatient Medications:  .  abatacept (ORENCIA) 250 MG injection, Inject into the vein once a week., Disp: , Rfl:  .  calcium carbonate (TUMS - DOSED IN MG ELEMENTAL CALCIUM) 500 MG chewable tablet, Chew 2 tablets by mouth daily as needed for indigestion or heartburn. Reported on 08/17/2015, Disp: , Rfl:  .  fluticasone furoate-vilanterol (BREO ELLIPTA) 100-25 MCG/INH AEPB, Inhale 1 puff into the lungs daily., Disp: 30 each, Rfl: 11 .  FOLIC ACID PO, Take by mouth. 2 TABLE QD., Disp: , Rfl:  .  losartan-hydrochlorothiazide (HYZAAR) 50-12.5 MG per tablet, Take 1 tablet by mouth daily., Disp: , Rfl:  .  omeprazole (PRILOSEC) 20 MG capsule, Take 1 capsule (20 mg total) by mouth daily., Disp: 90 capsule, Rfl: 3 .  spironolactone (ALDACTONE) 100 MG tablet, daily., Disp: , Rfl:   24 minutes spent on visit.  Candee Furbish, MD Trainer Pulmonary Critical Care 11/09/2019 11:48 AM

## 2019-11-09 NOTE — Patient Instructions (Signed)
-   Repeat CT looks good! - Continue breo and omeprazole - I do not think you need further followup unless your breathing worsens

## 2019-12-03 ENCOUNTER — Other Ambulatory Visit: Payer: Self-pay | Admitting: Orthopedic Surgery

## 2019-12-03 DIAGNOSIS — M533 Sacrococcygeal disorders, not elsewhere classified: Secondary | ICD-10-CM

## 2019-12-03 DIAGNOSIS — G8929 Other chronic pain: Secondary | ICD-10-CM

## 2019-12-03 DIAGNOSIS — M4326 Fusion of spine, lumbar region: Secondary | ICD-10-CM

## 2019-12-04 ENCOUNTER — Ambulatory Visit
Admission: RE | Admit: 2019-12-04 | Discharge: 2019-12-04 | Disposition: A | Discharge status: Home / Self Care | Payer: Commercial Managed Care - PPO | Source: Ambulatory Visit | Attending: Orthopedic Surgery | Admitting: Orthopedic Surgery

## 2019-12-04 DIAGNOSIS — M4326 Fusion of spine, lumbar region: Secondary | ICD-10-CM

## 2019-12-04 DIAGNOSIS — G8929 Other chronic pain: Secondary | ICD-10-CM

## 2019-12-04 IMAGING — CT CT PELVIS W/O CM
2 of 6 series · 14 of 46 positions shown, 16 images · non-contrast
Comparison: MRI 01/16/2016, CT 10/21/2012

CLINICAL DATA: Chronic left SI joint pain for 5 years after injury

EXAM:
CT PELVIS WITHOUT CONTRAST
TECHNIQUE: Multidetector CT imaging of the pelvis was performed following the
standard protocol without intravenous contrast.

[Series 8: pelvis 2.00 br40 s3 axial st · axial · 0.54mm/px · z∈[+1234,+1434]mm · 11 of 116 slices shown, 13 images (1 of 2)]
[im 8/116  soft-tissue]
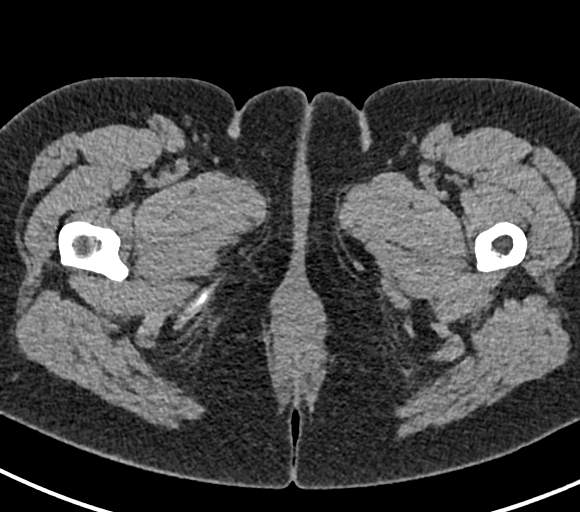
[im 8/116  bone]
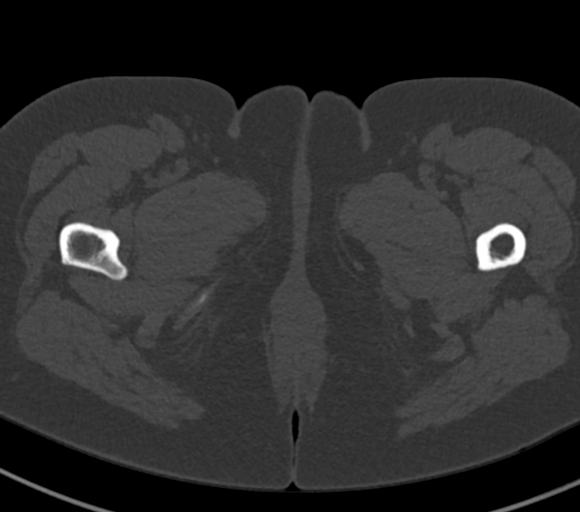
[im 16/116  soft-tissue]
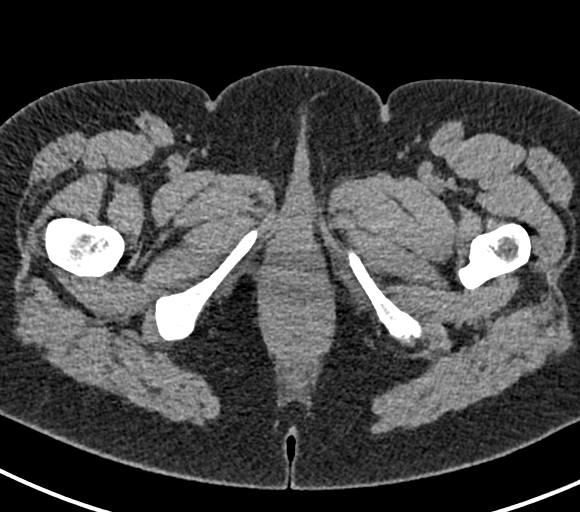
[im 31/116  soft-tissue]
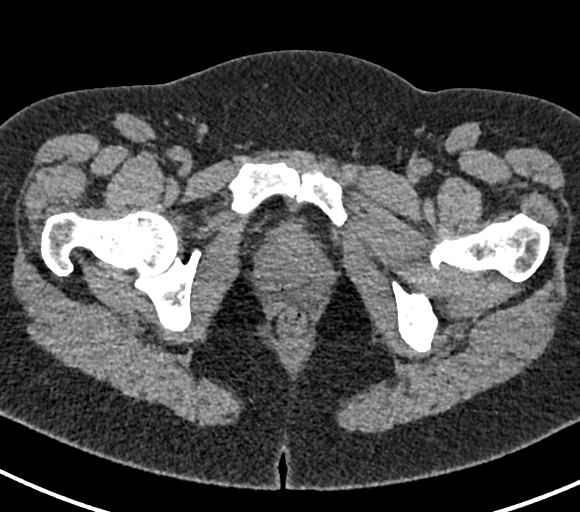
[im 39/116  soft-tissue]
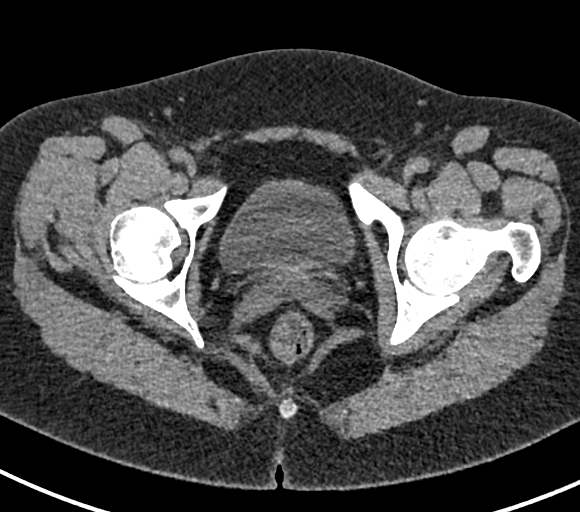
[im 47/116  soft-tissue]
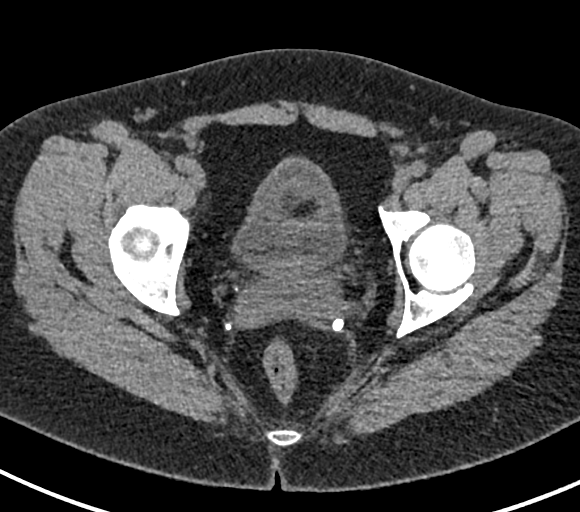
[im 62/116  soft-tissue]
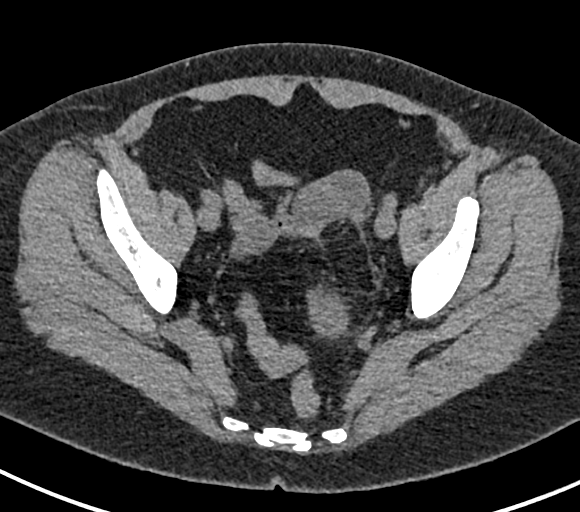
[im 70/116  soft-tissue]
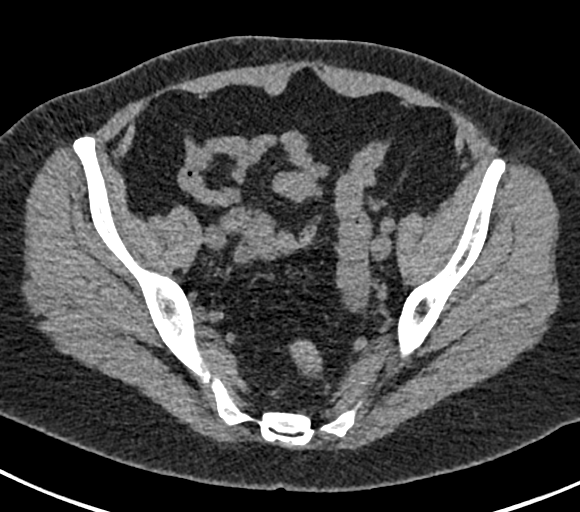
[im 77/116  soft-tissue]
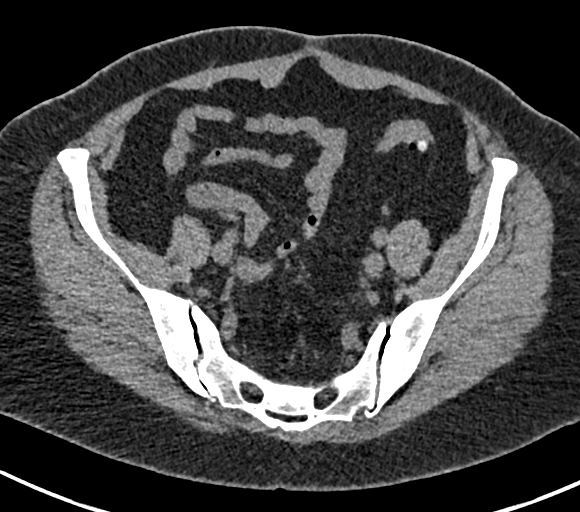
[im 85/116  soft-tissue]
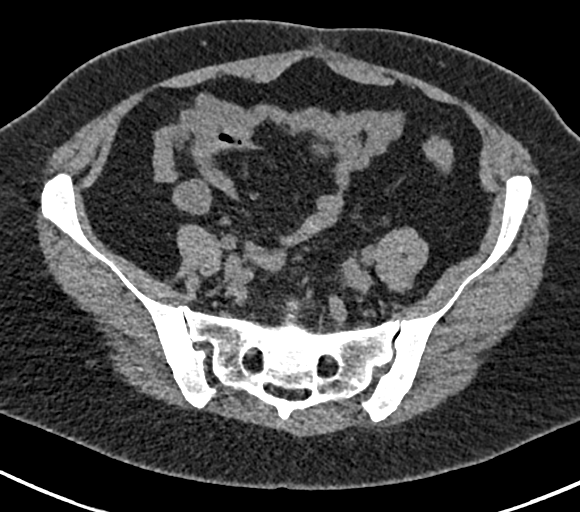
[im 85/116  bone]
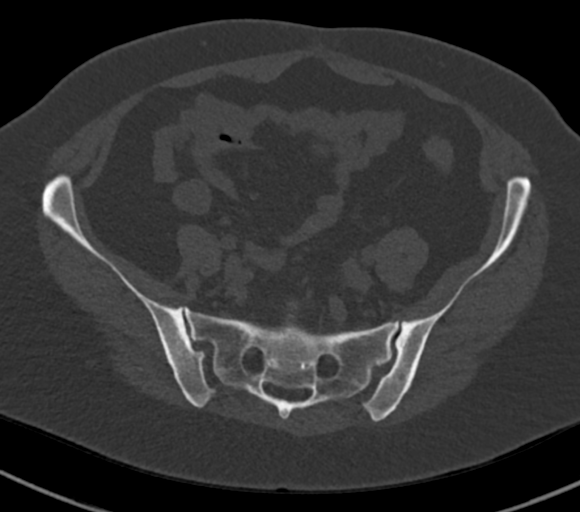
[im 100/116  soft-tissue]
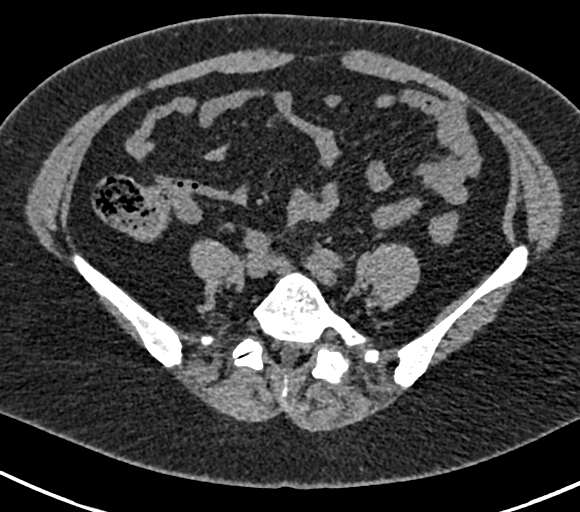
[im 108/116  soft-tissue]
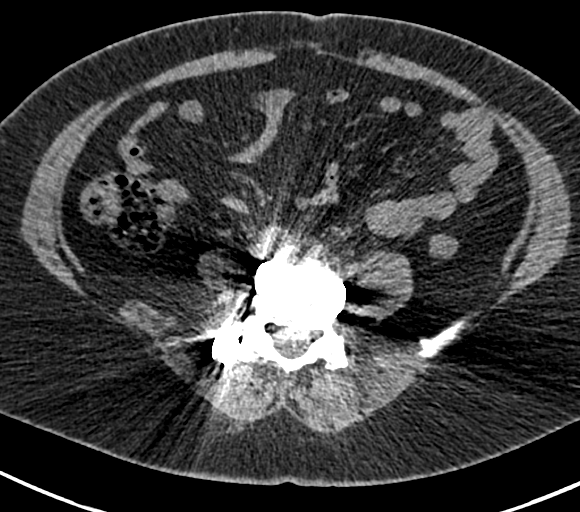

[Series 10: pelvis 2.00 br40 s3 axial st · coronal · 0.46mm/px · 3 of 137 slices shown (2 of 2)]
[im 35/137  soft-tissue]
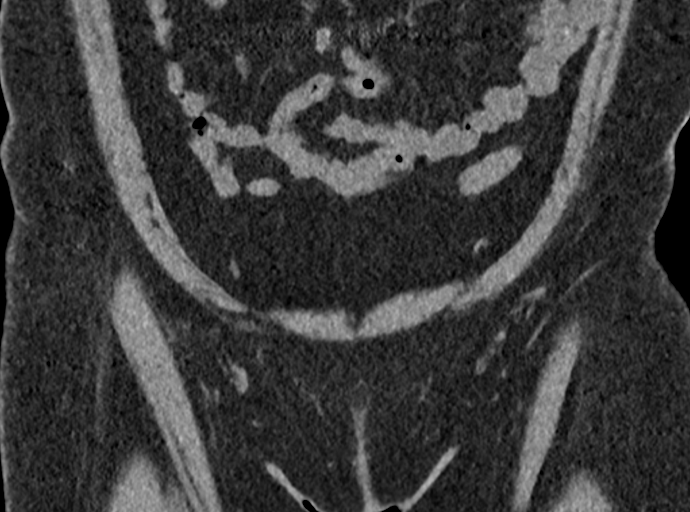
[im 69/137  soft-tissue]
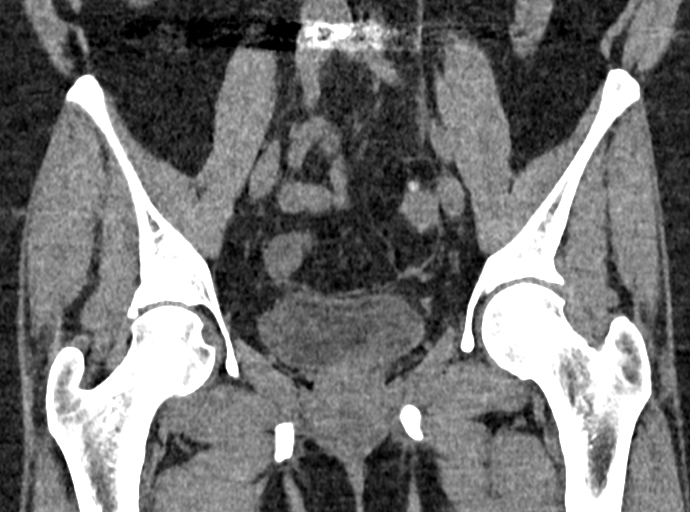
[im 103/137  soft-tissue]
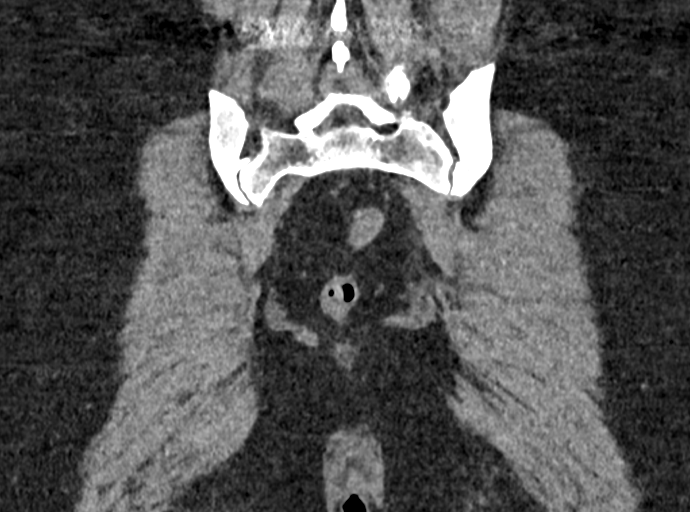

[14 of 46 positions shown; findings below may reference images not displayed]

FINDINGS: Urinary Tract:  No abnormality visualized.

Bowel: Sigmoid diverticulosis with stranding and a trace amount of
fluid adjacent to the distal sigmoid colon (series 8, images 59-64).
No organized fluid collection or abscess. No extraluminal air is
seen in the pelvis. No dilated loops of bowel.

Vascular/Lymphatic: No pathologically enlarged lymph nodes. No
significant vascular abnormality seen.

Reproductive:  Prior hysterectomy.  No adnexal mass.

Other: Tiny fat containing umbilical hernia. Surgical clips at the
base of the cecum.

Musculoskeletal: Partially visualized lumbar fusion hardware at the
L4-5 level. Sclerosis is again seen along the iliac side of the
bilateral SI joints similar in appearance to prior CT 10/21/2012. No
SI joint widening or erosion. There is mild degenerative spurring
along the inferior margins of the bilateral SI joints. No adjacent
soft tissue stranding or fluid collection. Bilateral hip joint
spaces are maintained. Tiny femoral head marginal osteophytosis on
the left. Mild arthropathy of the pubic symphysis. No acute
fracture, dislocation, or pelvic diastasis. No suspicious bone
lesion.
IMPRESSION: 1. Sigmoid diverticulosis with mild stranding and a trace amount of
fluid adjacent to the distal sigmoid colon, concerning for early
acute diverticulitis. No organized fluid collection or abscess. No
extraluminal air is seen in the pelvis.
2. Persistent sclerosis along the iliac side of the bilateral SI
joints without widening or erosion, similar in appearance to prior
CT 10/21/2012. Findings may reflect sequela of prior sacroiliitis.
No CT evidence to suggest active sacroiliitis.
3. Mild arthropathy of the pubic symphysis and left hip.

These results will be called to the ordering clinician or
representative by the Radiologist Assistant, and communication
documented in the PACS or [REDACTED].

## 2020-03-10 ENCOUNTER — Other Ambulatory Visit: Payer: Self-pay

## 2020-03-10 ENCOUNTER — Ambulatory Visit: Payer: Commercial Managed Care - PPO | Attending: Neurosurgery

## 2020-03-10 ENCOUNTER — Ambulatory Visit: Payer: Commercial Managed Care - PPO | Admitting: Physical Therapy

## 2020-03-10 DIAGNOSIS — M545 Low back pain, unspecified: Secondary | ICD-10-CM | POA: Diagnosis present

## 2020-03-10 DIAGNOSIS — M79662 Pain in left lower leg: Secondary | ICD-10-CM | POA: Diagnosis present

## 2020-03-10 DIAGNOSIS — R262 Difficulty in walking, not elsewhere classified: Secondary | ICD-10-CM | POA: Diagnosis present

## 2020-03-10 DIAGNOSIS — M79605 Pain in left leg: Secondary | ICD-10-CM

## 2020-03-10 DIAGNOSIS — M25552 Pain in left hip: Secondary | ICD-10-CM | POA: Diagnosis present

## 2020-03-10 NOTE — Therapy (Addendum)
[facility] [place], [place], [address] Phone: [phone]   Fax:  [phone]  Physical Therapy Evaluation  Patient Details  Name: Lisa Novak MRN: [id number] Date of Birth: [birth year] Referring Provider (PT): Than, Elmyra Ricks, MD    Encounter Date: 03/10/2020   PT End of Session - 03/10/20 1745    Visit Number 1    Number of Visits 9    Date for PT Re-Evaluation 04/09/20    Authorization Type [place] at visit 6 and visit 10    PT Start Time 1745    PT Stop Time 1830    PT Time Calculation (min) 45 min    Activity Tolerance Patient tolerated treatment well    Behavior During Therapy [facility] for tasks assessed/performed           Past Medical History:  Diagnosis Date  . Diabetes type 2, controlled ([place])   . Diverticulitis   . GERD (gastroesophageal reflux disease)   . Hypertension   . PONV (postoperative nausea and vomiting)   . RA (rheumatoid arthritis) ([place])   . SVD (spontaneous vaginal delivery)    x 3  . Umbilical hernia     Past Surgical History:  Procedure Laterality Date  . ABDOMINAL HYSTERECTOMY    . ABDOMINOPLASTY    . APPENDECTOMY    . HERNIA REPAIR    . LAPAROSCOPIC APPENDECTOMY N/A 02/11/2014   Procedure: APPENDECTOMY LAPAROSCOPIC;  Surgeon: Autumn Messing III, MD;  Location: [facility];  Service: General;  Laterality: N/A;  . ROBOTIC ASSISTED LAPAROSCOPIC OVARIAN CYSTECTOMY Left 04/13/2014   Procedure: ROBOTIC ASSISTED LAPAROSCOPIC Left OVARIAN CYSTECTOMY, BILATERAL SALPINGECTOMY,lysis of adhesions;  Surgeon: Princess Bruins, MD;  Location: [facility];  Service: Gynecology;  Laterality: Left;  . WISDOM TOOTH EXTRACTION      There were no vitals filed for this visit.    Subjective Assessment - 03/10/20 1749    Subjective Patient states that about 11 weeks ago (around December 29, 2019), she had her left SI joint fused. Since then, she is having increased pain compared to before  surgery. Pt also had L4-5 fusion 2 years ago but did not have these particular symptoms following this surgery. Pt has symptoms in L hip radiating down to groin and posterior thigh, sometimes all the way through ankle.    Pertinent History see extensive list of pertinent PMH    Limitations Sitting;Lifting;Standing;Walking;House hold activities    How long can you sit comfortably? Needs to move at least once an hour to readjust/get comfortable    How long can you stand comfortably? 15 minutes at the most before feeling aching and numbness down left leg    How long can you walk comfortably? 5 minutes before feeling a "catching" in L anterior hip    Patient Stated Goals "just want to get back to normal household things", walking around stores, standing, sitting for work    Currently in Pain? Yes    Pain Score 8     Pain Location Hip    Pain Orientation Left;Proximal    Pain Descriptors / Indicators Aching;Sharp;Shooting;Other (Comment);Burning;Dull   feels swollen   Pain Type Acute pain    Pain Radiating Towards L groin, posterior thigh and occasionally down posterior lower leg    Pain Onset More than a month ago    Pain Frequency Constant   some days are better than others   Aggravating Factors  sitting, walking, standing, increased activity, carrying objects (increased  load), bending forwards, getting into bed    Pain Relieving Factors heat, ice, lying on left side    Effect of Pain on Daily Activities difficulty performing all daily activities due to pain              [facility] PT Assessment - 03/10/20 0001      Assessment   Medical Diagnosis  Lumbago with sciatica, left side M54.42, Other chronic pain G89.29    Referring Provider (PT) Than, Elmyra Ricks, MD     Onset Date/Surgical Date 12/29/19    Hand Dominance Right    Next MD Visit Pt not sure - possibly in 4 months or so    Prior Therapy Not for this particular issue      Precautions   Precautions Other (comment)    Precaution  Comments Per pt, limit lifting to one gallon of milk for 6 months      Restrictions   Weight Bearing Restrictions No      Balance Screen   Has the patient fallen in the past 6 months No    Has the patient had a decrease in activity level because of a fear of falling?  Yes    Is the patient reluctant to leave their home because of a fear of falling?  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 - currently gets up often for pain relief    Leisure household activities, walking, standing      Cognition   Overall Cognitive Status Within Functional Limits for tasks assessed      Observation/Other Assessments   Observations Antalgic gait upon walking back to therapy gym; decreased stance time on LLE    Focus on Therapeutic Outcomes (FOTO)  68% limited; predicted 44% limited      Sensation   Additional Comments TTP along L glute med and piriformis; no significant sensation deficits noted upon palpation to LLE compared to RLE      ROM / Strength   AROM / PROM / Strength AROM;PROM;Strength      AROM   Overall AROM Comments Trunk rotation to either side equal with no symptom aggravation    AROM Assessment Site Lumbar;Hip    Lumbar Flexion 30   30 deg before pinching in L groin   Lumbar Extension 10    Lumbar - Right Side Bend 20    Lumbar - Left Side Bend 20      PROM   PROM Assessment Site Lumbar;Hip      Strength    Overall Strength Comments B knee FL and EXT 5/5    Strength Assessment Site Hip    Right/Left Hip Right;Left    Right Hip Flexion 5/5    Left Hip Flexion 5/5    Left Hip ABduction 4-/5  Special Tests   Other special tests Long sit test: Performed MET with pt in supine due to posterior innominate rotation noted in RLE during long sit test - L hamstring and R quad activation 8 x 5 seconds each. Long sit test reassessed after with no apparent R posterior innominate rotation noted.      Ambulation/Gait   Gait Comments Patient demonstrates antalgic gait with decreased stance time on LLE.      High Level Balance   High Level Balance Comments Patient able to stand with narrow base of support without LOB but has pain/symptoms in weightbearing - tends to favor/offload L side                      Objective measurements completed on examination: See above findings.       [facility] Adult PT Treatment/Exercise - 03/10/20 0001      Self-Care   Self-Care Other Self-Care Comments    Other Self-Care Comments  Reviewed FOTO, HEP, education on anatomy of condition. Education provided regarding use of METs to assist with muscle imbalances, anatomy of piriformis and sciatic nerve.      Exercises   Exercises Knee/Hip      Knee/Hip Exercises: Stretches   Piriformis Stretch Left;2 reps;30 seconds      Knee/Hip Exercises: Supine   Bridges Strengthening;Both;10 reps    Other Supine Knee/Hip Exercises Performed MET with pt in supine due to posterior innominate rotation noted in RLE during long sit test - L hamstring and R quad activation 8 x 5 seconds each. Long sit test reassessed after with no apparent R posterior innominate rotation noted.      Knee/Hip Exercises: Sidelying   Hip ABduction Strengthening;Left;15 reps      Manual Therapy   Manual Therapy Soft tissue mobilization;Myofascial release    Manual therapy comments STM and myofascial release along L glute med and piriformis.  STM around surgical site from L SI joint fusion.                  PT Education - 03/10/20 1832    Education Details Reviewed FOTO, HEP, education on anatomy of condition. Education provided regarding use of METs to assist with muscle imbalances, anatomy of piriformis and sciatic nerve.    Person(s) Educated Patient    Methods Explanation;Demonstration;Tactile cues;Verbal cues    Comprehension Verbalized understanding;Returned demonstration            PT Short Term Goals - 03/10/20 1842      PT SHORT TERM GOAL #1   Title Patient will be independent with initial HEP.    Baseline Pt given HEP at initial evaluation 03/10/2020    Time 2    Period Weeks    Status New    Target Date 03/24/20      PT SHORT TERM GOAL #2   Title Pt will be able to walk >10 minutes without significant increase in pain.    Baseline Pt has significant pain after 5 minutes of walking.    Time 2    Period Weeks    Status New    Target Date 03/24/20      PT SHORT TERM GOAL #3   Title Pt will be able to stand for 30 minutes without significant increase in pain.    Baseline Pt has significant pain after 20 minutes of standing.    Time 2    Period Weeks    Status New    Target Date  03/24/20      PT SHORT TERM GOAL #4   Title Patient will be able to perform > 30 degrees lumbar flexion in standing with < 3/10 pain in L groin.    Baseline Pt has ~5/10 pain with 30 degrees lumbar flexion in standing.    Time 2    Period Weeks    Status New             PT Long Term Goals - 03/10/20 1846      PT LONG TERM GOAL #1   Title Patient will be independent with advanced HEP.    Baseline Patient currently received initial HEP on eval 03/10/2020.    Time 4    Period Weeks    Status New    Target Date 04/07/20      PT LONG TERM GOAL #2   Title Patient will improve FOTO score from 68% limited to 50% limited or better.    Baseline 68% limited; predicted 44% limited    Time 4    Period Weeks     Status New    Target Date 04/07/20      PT LONG TERM GOAL #3   Title Patient will be able to sit for at least 2 hours without having significant increase in pain.    Baseline Patient currently hasto readjust constantly and gets up at least once per hour when sitting.    Time 4    Period Weeks    Status New    Target Date 04/07/20      PT LONG TERM GOAL #4   Title Patient will be able to perform community ambulation with normalized gait pattern for at least 1 hour without needing to take a seated rest break.    Baseline Pt currently is able to tolerate 5 minutes of walking before pain becomes severe. Pt demonstrates antalgic gait.    Time 4    Period Weeks    Status New    Target Date 04/07/20                  Plan - 03/10/20 1834    Clinical Impression Statement Patient presents to OPPT with complaints of pain along L SI joint and hip that radiates down to L groin and posterior L thigh (occasionally down to L foot). Pt states pain has been present since fusion of L SI joint on December 29, 2019. Pt demonstrates antalgic gait pattern with decreased stance time on LLE due to increased pain with loading. Patient has good strength in BLE but has pain with most functional activities. Pt will benefit from skilled PT intervention to decrease pain and increase functional mobility.    Personal Factors and Comorbidities Comorbidity 1;Comorbidity 3+;Comorbidity 2    Comorbidities see extensive list of pertinent PMH    Examination-Activity Limitations Bend;Bed Mobility;Lift;Locomotion Level;Sit;Sleep;Squat;Stairs;Stand    Examination-Participation Restrictions Community Activity;Driving;Cleaning;Shop    Stability/Clinical Decision Making Evolving/Moderate complexity    Clinical Decision Making Moderate    Rehab Potential Good    PT Frequency 2x / week    PT Duration 4 weeks    PT Treatment/Interventions ADLs/Self Care Home Management;Electrical Stimulation;Moist Heat;Neuromuscular  re-education;Balance training;Therapeutic exercise;Therapeutic activities;Functional mobility training;Stair training;Gait training;Patient/family education;Manual techniques;Dry needling;Passive range of motion;Scar mobilization;Taping;Vasopneumatic Device    PT Next Visit Plan Review HEP, STM and myofascial release of L glute med, piriformis, hip flexors; scar mobilization/desensitization at L SI joint, reassess long sit test    PT Home Exercise Plan [id number] - piriformis  stretch, bridges, sidelying hip ABD    Consulted and Agree with Plan of Care Patient           Patient will benefit from skilled therapeutic intervention in order to improve the following deficits and impairments:  Abnormal gait, Decreased activity tolerance, Decreased mobility, Decreased strength, Decreased scar mobility, Decreased endurance, Decreased range of motion, Difficulty walking  Visit Diagnosis: Difficulty in walking, not elsewhere classified  Pain in left hip  Pain in left leg  Pain in left lower leg  Acute left-sided low back pain, unspecified whether sciatica present     Problem List Patient Active Problem List   Diagnosis Date Noted  . Ovarian cyst 04/13/2014  . Appendicitis, acute 02/11/2014   Haydee Monica, PT, DPT 03/10/20 7:28 PM  [facility] [address] [place], [place], [address] Phone: [phone]   Fax:  [phone]  Name: Lisa Novak MRN: [id number] Date of Birth: [birth year]

## 2020-03-11 NOTE — Addendum Note (Signed)
Addended by: Selinda Eon C on: 03/11/2020 02:49 PM   Modules accepted: Orders

## 2020-03-11 NOTE — Addendum Note (Signed)
Addended by: Selinda Eon C on: 03/11/2020 12:16 PM   Modules accepted: Orders

## 2020-03-21 ENCOUNTER — Other Ambulatory Visit: Payer: Self-pay

## 2020-03-21 ENCOUNTER — Ambulatory Visit: Payer: Commercial Managed Care - PPO

## 2020-03-21 DIAGNOSIS — M25552 Pain in left hip: Secondary | ICD-10-CM

## 2020-03-21 DIAGNOSIS — M79662 Pain in left lower leg: Secondary | ICD-10-CM

## 2020-03-21 DIAGNOSIS — M79605 Pain in left leg: Secondary | ICD-10-CM

## 2020-03-21 DIAGNOSIS — R262 Difficulty in walking, not elsewhere classified: Secondary | ICD-10-CM

## 2020-03-21 DIAGNOSIS — M545 Low back pain, unspecified: Secondary | ICD-10-CM

## 2020-03-21 NOTE — Therapy (Signed)
[place], [place], [address] Phone: [phone]   Fax:  [phone]  Physical Therapy Treatment  Patient Details  Name: Lisa Novak MRN: [id number] Date of Birth: 01/04/1971 Referring Provider (PT): Than, Elmyra Ricks, MD    Encounter Date: 03/21/2020   PT End of Session - 03/21/20 0832    Visit Number 2    Number of Visits 9    Date for PT Re-Evaluation 04/09/20    Authorization Type [place] at visit 6 and visit 10    PT Start Time [phone]   pt arrived late   PT Stop Time 0911    PT Time Calculation (min) 39 min    Activity Tolerance Patient tolerated treatment well    Behavior During Therapy [facility] for tasks assessed/performed           Past Medical History:  Diagnosis Date   Diabetes type 2, controlled ([place])    Diverticulitis    GERD (gastroesophageal reflux disease)    Hypertension    PONV (postoperative nausea and vomiting)    RA (rheumatoid arthritis) ([place])    SVD (spontaneous vaginal delivery)    x 3   Umbilical hernia     Past Surgical History:  Procedure Laterality Date   ABDOMINAL HYSTERECTOMY     ABDOMINOPLASTY     APPENDECTOMY     HERNIA REPAIR     LAPAROSCOPIC APPENDECTOMY N/A 02/11/2014   Procedure: APPENDECTOMY LAPAROSCOPIC;  Surgeon: Autumn Messing III, MD;  Location: [place];  Service: General;  Laterality: N/A;   ROBOTIC ASSISTED LAPAROSCOPIC OVARIAN CYSTECTOMY Left 04/13/2014   Procedure: ROBOTIC ASSISTED LAPAROSCOPIC Left OVARIAN CYSTECTOMY, BILATERAL SALPINGECTOMY,lysis of adhesions;  Surgeon: Princess Bruins, MD;  Location: [facility];  Service: Gynecology;  Laterality: Left;   WISDOM TOOTH EXTRACTION      There were no vitals filed for this visit.   Subjective Assessment - 03/21/20 0832    Subjective Patient states she has been having increased pain with any activity, especially over the weekend. Pt reports having a "couple of good days"  after previous session but also had limited activity, which may have contributed to ease of symptoms.    Pertinent History see extensive list of pertinent PMH    Limitations Sitting;Lifting;Standing;Walking;House hold activities    How long can you sit comfortably? Needs to move at least once an hour to readjust/get comfortable    How long can you stand comfortably? 15 minutes at the most before feeling aching and numbness down left leg    How long can you walk comfortably? 5 minutes before feeling a "catching" in L anterior hip    Patient Stated Goals "just want to get back to normal household things", walking around stores, standing, sitting for work    Currently in Pain? Yes    Pain Score 7     Pain Location Hip   L SI joint   Pain Orientation Left;Proximal    Pain Descriptors / Indicators Aching;Dull    Pain Type Acute pain    Pain Onset More than a month ago              [facility] PT Assessment - 03/21/20 0001      Assessment   Medical Diagnosis  Lumbago with sciatica, left side M54.42, Other chronic pain G89.29    Referring Provider (PT) Than, Elmyra Ricks, MD  [place] Adult PT Treatment/Exercise - 03/21/20 0001      Ambulation/Gait   Gait Comments Pt with antalgic gait demonstrating decreased stance time on LLE upon arrival but was able to ambulate with a more normalized gait pattern at end of session (slightly antalgic still)      Self-Care   Self-Care Other Self-Care Comments    Other Self-Care Comments  Education regarding use of rolling pin, foam roller, and/or tennis balls at home for self STM and myofascial release in addition to HEP and L hip FL and ABD stretches.      Knee/Hip Exercises: Stretches   Piriformis Stretch Left;2 reps;30 seconds    Other Knee/Hip Stretches Modified Thomas stretch LLE 4 x 30 sec each      Knee/Hip Exercises: Aerobic   Nustep L5 x 5 min      Knee/Hip Exercises: Standing   Step Down Limitations Heel taps  with LLE as stance leg - 6" step 2 x 10; cues for optimal technique    SLS LLE SLS on dynadisc 8 x 10 sec each      Knee/Hip Exercises: Supine   Bridges Strengthening;Both;20 reps    Bridges Limitations BLE on green physioball    Other Supine Knee/Hip Exercises Transversus abdominus activation while alternating kicks/marches in hooklying position 20x each LE      Knee/Hip Exercises: Sidelying   Hip ABduction Strengthening;Left;15 reps      Manual Therapy   Manual Therapy Soft tissue mobilization;Myofascial release;Passive ROM    Manual therapy comments STM and myofascial release along L glute med, piriformis, TFL, hip flexors    Soft tissue mobilization Roller along L hip flexors, IT band, TFL, and glute med    Passive ROM L hip ABD stretch in Ober position from gentle overpressure from PT 4 x 30 sec                  PT Education - 03/21/20 1025    Education Details Education regarding use of rolling pin, foam roller, and/or tennis balls at home for self STM and myofascial release in addition to HEP and L hip FL and ABD stretches.    Person(s) Educated Patient    Methods Explanation;Demonstration;Tactile cues;Verbal cues    Comprehension Verbalized understanding;Returned demonstration            PT Short Term Goals - 03/10/20 1842      PT SHORT TERM GOAL #1   Title Patient will be independent with initial HEP.    Baseline Pt given HEP at initial evaluation 03/10/2020    Time 2    Period Weeks    Status New    Target Date 03/24/20      PT SHORT TERM GOAL #2   Title Pt will be able to walk >10 minutes without significant increase in pain.    Baseline Pt has significant pain after 5 minutes of walking.    Time 2    Period Weeks    Status New    Target Date 03/24/20      PT SHORT TERM GOAL #3   Title Pt will be able to stand for 30 minutes without significant increase in pain.    Baseline Pt has significant pain after 20 minutes of standing.    Time 2    Period  Weeks    Status New    Target Date 03/24/20      PT SHORT TERM GOAL #4   Title Patient will be able to perform >  30 degrees lumbar flexion in standing with < 3/10 pain in L groin.    Baseline Pt has ~5/10 pain with 30 degrees lumbar flexion in standing.    Time 2    Period Weeks    Status New             PT Long Term Goals - 03/10/20 1846      PT LONG TERM GOAL #1   Title Patient will be independent with advanced HEP.    Baseline Patient currently received initial HEP on eval 03/10/2020.    Time 4    Period Weeks    Status New    Target Date 04/07/20      PT LONG TERM GOAL #2   Title Patient will improve FOTO score from 68% limited to 50% limited or better.    Baseline 68% limited; predicted 44% limited    Time 4    Period Weeks    Status New    Target Date 04/07/20      PT LONG TERM GOAL #3   Title Patient will be able to sit for at least 2 hours without having significant increase in pain.    Baseline Patient currently hasto readjust constantly and gets up at least once per hour when sitting.    Time 4    Period Weeks    Status New    Target Date 04/07/20      PT LONG TERM GOAL #4   Title Patient will be able to perform community ambulation with normalized gait pattern for at least 1 hour without needing to take a seated rest break.    Baseline Pt currently is able to tolerate 5 minutes of walking before pain becomes severe. Pt demonstrates antalgic gait.    Time 4    Period Weeks    Status New    Target Date 04/07/20                 Plan - 03/21/20 0837    Clinical Impression Statement Patient tolerated session well with no complaints of significant increase in pain. Pt with TTP along L hip flexors and ABD that was eased following STM, myofascial release, and use of roller. Pt was able to tolerate weightbearing and gait with decreased symptoms at end of session.    Personal Factors and Comorbidities Comorbidity 1;Comorbidity 3+;Comorbidity 2     Comorbidities see extensive list of pertinent PMH    Examination-Activity Limitations Bend;Bed Mobility;Lift;Locomotion Level;Sit;Sleep;Squat;Stairs;Stand    Examination-Participation Restrictions Community Activity;Driving;Cleaning;Shop    Stability/Clinical Decision Making Evolving/Moderate complexity    Rehab Potential Good    PT Frequency 2x / week    PT Duration 4 weeks    PT Treatment/Interventions ADLs/Self Care Home Management;Electrical Stimulation;Moist Heat;Neuromuscular re-education;Balance training;Therapeutic exercise;Therapeutic activities;Functional mobility training;Stair training;Gait training;Patient/family education;Manual techniques;Dry needling;Passive range of motion;Scar mobilization;Taping;Vasopneumatic Device    PT Next Visit Plan Discuss response after previous session, STM and myofascial release of L glute med, piriformis, hip flexors, hip ABD; scar mobilization/desensitization at L SI joint if TTP, progress LLE strengthening    PT Home Exercise Plan [id number] - piriformis stretch, bridges, sidelying hip ABD    Consulted and Agree with Plan of Care Patient           Patient will benefit from skilled therapeutic intervention in order to improve the following deficits and impairments:  Abnormal gait, Decreased activity tolerance, Decreased mobility, Decreased strength, Decreased scar mobility, Decreased endurance, Decreased range of motion, Difficulty walking  Visit Diagnosis:  Difficulty in walking, not elsewhere classified  Pain in left hip  Pain in left leg  Pain in left lower leg  Acute left-sided low back pain, unspecified whether sciatica present     Problem List Patient Active Problem List   Diagnosis Date Noted   Ovarian cyst 04/13/2014   Appendicitis, acute 02/11/2014     Haydee Monica, PT, DPT 03/21/20 10:35 AM  [facility] [address] [place], [place], [address] Phone: [phone]    Fax:  [phone]  Name: Gerica Koble MRN: [id number] Date of Birth: [birth year]

## 2020-03-24 ENCOUNTER — Ambulatory Visit: Payer: Commercial Managed Care - PPO

## 2020-03-24 ENCOUNTER — Other Ambulatory Visit: Payer: Self-pay

## 2020-03-24 DIAGNOSIS — M25552 Pain in left hip: Secondary | ICD-10-CM

## 2020-03-24 DIAGNOSIS — R262 Difficulty in walking, not elsewhere classified: Secondary | ICD-10-CM

## 2020-03-24 DIAGNOSIS — M79605 Pain in left leg: Secondary | ICD-10-CM

## 2020-03-24 DIAGNOSIS — M79662 Pain in left lower leg: Secondary | ICD-10-CM

## 2020-03-24 DIAGNOSIS — M545 Low back pain, unspecified: Secondary | ICD-10-CM

## 2020-03-24 NOTE — Therapy (Signed)
[place], [place], [address] Phone: [phone]   Fax:  [phone]  Physical Therapy Treatment  Patient Details  Name: Lisa Novak MRN: [id number] Date of Birth: 1971-02-14 Referring Provider (PT): Than, Elmyra Ricks, MD    Encounter Date: 03/24/2020   PT End of Session - 03/24/20 0914    Visit Number 3    Number of Visits 9    Date for PT Re-Evaluation 04/09/20    Authorization Type [place] at visit 6 and visit 10    PT Start Time 0915    PT Stop Time [id number]    PT Time Calculation (min) 38 min    Activity Tolerance Patient tolerated treatment well    Behavior During Therapy [facility] for tasks assessed/performed           Past Medical History:  Diagnosis Date  . Diabetes type 2, controlled ([place])   . Diverticulitis   . GERD (gastroesophageal reflux disease)   . Hypertension   . PONV (postoperative nausea and vomiting)   . RA (rheumatoid arthritis) ([place])   . SVD (spontaneous vaginal delivery)    x 3  . Umbilical hernia     Past Surgical History:  Procedure Laterality Date  . ABDOMINAL HYSTERECTOMY    . ABDOMINOPLASTY    . APPENDECTOMY    . HERNIA REPAIR    . LAPAROSCOPIC APPENDECTOMY N/A 02/11/2014   Procedure: APPENDECTOMY LAPAROSCOPIC;  Surgeon: Autumn Messing III, MD;  Location: [place];  Service: General;  Laterality: N/A;  . ROBOTIC ASSISTED LAPAROSCOPIC OVARIAN CYSTECTOMY Left 04/13/2014   Procedure: ROBOTIC ASSISTED LAPAROSCOPIC Left OVARIAN CYSTECTOMY, BILATERAL SALPINGECTOMY,lysis of adhesions;  Surgeon: Princess Bruins, MD;  Location: [facility];  Service: Gynecology;  Laterality: Left;  . WISDOM TOOTH EXTRACTION      There were no vitals filed for this visit.   Subjective Assessment - 03/24/20 0914    Subjective "I am having some soreness again today." Pt reports feeling relief the night of her last session but had return of symptoms beginning the next day after  performing day to day activities.    Pertinent History see extensive list of pertinent PMH    Limitations Sitting;Lifting;Standing;Walking;House hold activities    How long can you sit comfortably? Needs to move at least once an hour to readjust/get comfortable    How long can you stand comfortably? 15 minutes at the most before feeling aching and numbness down left leg    How long can you walk comfortably? 5 minutes before feeling a "catching" in L anterior hip    Patient Stated Goals "just want to get back to normal household things", walking around stores, standing, sitting for work    Currently in Pain? Yes    Pain Score 6     Pain Location Hip   L SI joint to L hip   Pain Orientation Left;Proximal    Pain Descriptors / Indicators Aching;Dull;Sore    Pain Type Acute pain    Pain Onset More than a month ago              [facility] PT Assessment - 03/24/20 0001      Assessment   Medical Diagnosis  Lumbago with sciatica, left side M54.42, Other chronic pain G89.29    Referring Provider (PT) Than, Elmyra Ricks, MD  [place] Adult PT Treatment/Exercise - 03/24/20 0001      Self-Care   Self-Care Other Self-Care Comments    Other Self-Care Comments  Reviewed HEP and addition of interventions, discussed potential dry needling in L piriformis/glute med for next session (pt very interested as she has had relief from dry needling in neck and low back musculature previously)      Knee/Hip Exercises: Stretches   Passive Hamstring Stretch Left;2 reps;30 seconds    Passive Hamstring Stretch Limitations green strap in supine    Hip Flexor Stretch Left;2 reps;30 seconds    Hip Flexor Stretch Limitations standing while holding ankle - cues for form    ITB Stretch Left;2 reps;30 seconds      Knee/Hip Exercises: Aerobic   Nustep L5 x 5 min      Knee/Hip Exercises: Standing   Hip Extension Stengthening;Left;20 reps;Knee straight    Extension Limitations 3# ankle  weight    Forward Step Up Left;20 reps;Step Height: 8"    Forward Step Up Limitations with RLE hip flexion after step up then back down (LLE as stance leg on step); pt felt "pinching" at proximal L hip flexor during initiation of concentric and eccentric contraction (beginning of step up and just before descent)    SLS LLE SLS on BOSU ball 3 x 20 sec      Knee/Hip Exercises: Supine   Bridges Strengthening;Both;20 reps    Bridges Limitations BLE on green physioball; attempted single leg (LLE) bridges without physioball but too difficult at this time    Straight Leg Raises Strengthening;Left;20 reps    Straight Leg Raises Limitations 3# ankle weight    Other Supine Knee/Hip Exercises 30x dead bugs (15x each UE/LE)      Knee/Hip Exercises: Sidelying   Hip ABduction Strengthening;Left;20 reps    Hip ABduction Limitations 3# ankle weight    Clams 20x green theraband      Manual Therapy   Manual Therapy Soft tissue mobilization;Myofascial release;Passive ROM    Manual therapy comments STM, myofascial release, and IASTM along L glute med, piriformis, and L hip flexors                  PT Education - 03/24/20 0920    Education Details Reviewed HEP and addition of interventions, discussed potential dry needling in L piriformis/glute med for next session (pt very interested as she has had relief from dry needling in neck and low back musculature previously)    Person(s) Educated Patient    Methods Explanation;Demonstration    Comprehension Verbalized understanding;Returned demonstration            PT Short Term Goals - 03/10/20 1842      PT SHORT TERM GOAL #1   Title Patient will be independent with initial HEP.    Baseline Pt given HEP at initial evaluation 03/10/2020    Time 2    Period Weeks    Status New    Target Date 03/24/20      PT SHORT TERM GOAL #2   Title Pt will be able to walk >10 minutes without significant increase in pain.    Baseline Pt has significant pain  after 5 minutes of walking.    Time 2    Period Weeks    Status New    Target Date 03/24/20      PT SHORT TERM GOAL #3   Title Pt will be able to stand for 30 minutes without significant increase in pain.  Baseline Pt has significant pain after 20 minutes of standing.    Time 2    Period Weeks    Status New    Target Date 03/24/20      PT SHORT TERM GOAL #4   Title Patient will be able to perform > 30 degrees lumbar flexion in standing with < 3/10 pain in L groin.    Baseline Pt has ~5/10 pain with 30 degrees lumbar flexion in standing.    Time 2    Period Weeks    Status New             PT Long Term Goals - 03/10/20 1846      PT LONG TERM GOAL #1   Title Patient will be independent with advanced HEP.    Baseline Patient currently received initial HEP on eval 03/10/2020.    Time 4    Period Weeks    Status New    Target Date 04/07/20      PT LONG TERM GOAL #2   Title Patient will improve FOTO score from 68% limited to 50% limited or better.    Baseline 68% limited; predicted 44% limited    Time 4    Period Weeks    Status New    Target Date 04/07/20      PT LONG TERM GOAL #3   Title Patient will be able to sit for at least 2 hours without having significant increase in pain.    Baseline Patient currently hasto readjust constantly and gets up at least once per hour when sitting.    Time 4    Period Weeks    Status New    Target Date 04/07/20      PT LONG TERM GOAL #4   Title Patient will be able to perform community ambulation with normalized gait pattern for at least 1 hour without needing to take a seated rest break.    Baseline Pt currently is able to tolerate 5 minutes of walking before pain becomes severe. Pt demonstrates antalgic gait.    Time 4    Period Weeks    Status New    Target Date 04/07/20                 Plan - 03/24/20 0914    Clinical Impression Statement Pt tolerated addition of hip strengthening exercises without significant  complaints of pain. Pt shows continued TTP at proximal L hip flexors, L glute med, and L piriformis. Pt expressed high interest in trigger point dry needling for next session if possible.    Personal Factors and Comorbidities Comorbidity 1;Comorbidity 3+;Comorbidity 2    Comorbidities see extensive list of pertinent PMH    Examination-Activity Limitations Bend;Bed Mobility;Lift;Locomotion Level;Sit;Sleep;Squat;Stairs;Stand    Examination-Participation Restrictions Community Activity;Driving;Cleaning;Shop    Stability/Clinical Decision Making Evolving/Moderate complexity    Rehab Potential Good    PT Frequency 2x / week    PT Duration 4 weeks    PT Treatment/Interventions ADLs/Self Care Home Management;Electrical Stimulation;Moist Heat;Neuromuscular re-education;Balance training;Therapeutic exercise;Therapeutic activities;Functional mobility training;Stair training;Gait training;Patient/family education;Manual techniques;Dry needling;Passive range of motion;Scar mobilization;Taping;Vasopneumatic Device    PT Next Visit Plan Discuss response after previous session, TPDN potentially, STM and myofascial release of L glute med, piriformis, hip flexors, hip ABD, progress LLE strengthening    PT Home Exercise Plan [id number] - piriformis stretch, bridges, sidelying hip ABD, modified Thomas stretch, supine ITB stretch, seated HS stretch, standing hip EXT, clamshell with green theraband, supine dead bug  Consulted and Agree with Plan of Care Patient           Patient will benefit from skilled therapeutic intervention in order to improve the following deficits and impairments:  Abnormal gait, Decreased activity tolerance, Decreased mobility, Decreased strength, Decreased scar mobility, Decreased endurance, Decreased range of motion, Difficulty walking  Visit Diagnosis: Difficulty in walking, not elsewhere classified  Pain in left hip  Pain in left leg  Pain in left lower leg  Acute left-sided low  back pain, unspecified whether sciatica present     Problem List Patient Active Problem List   Diagnosis Date Noted  . Ovarian cyst 04/13/2014  . Appendicitis, acute 02/11/2014     Haydee Monica, PT, DPT 03/24/20 1:04 PM  [facility] [address] [place], [place], [address] Phone: [phone]   Fax:  [phone]  Name: Lisa Novak MRN: [id number] Date of Birth: [birth year]

## 2020-03-28 ENCOUNTER — Ambulatory Visit: Payer: Commercial Managed Care - PPO | Attending: Neurosurgery

## 2020-03-28 ENCOUNTER — Other Ambulatory Visit: Payer: Self-pay

## 2020-03-28 DIAGNOSIS — M25552 Pain in left hip: Secondary | ICD-10-CM

## 2020-03-28 DIAGNOSIS — M79662 Pain in left lower leg: Secondary | ICD-10-CM | POA: Diagnosis present

## 2020-03-28 DIAGNOSIS — M545 Low back pain, unspecified: Secondary | ICD-10-CM | POA: Diagnosis present

## 2020-03-28 DIAGNOSIS — R262 Difficulty in walking, not elsewhere classified: Secondary | ICD-10-CM | POA: Diagnosis present

## 2020-03-28 DIAGNOSIS — M79605 Pain in left leg: Secondary | ICD-10-CM | POA: Diagnosis present

## 2020-03-28 NOTE — Therapy (Signed)
[place], [place], [address] Phone: [phone]   Fax:  [phone]  Physical Therapy Treatment  Patient Details  Name: Lisa Novak MRN: [id number] Date of Birth: 10-Jan-1971 Referring Provider (PT): Than, Elmyra Ricks, MD    Encounter Date: 03/28/2020   PT End of Session - 03/28/20 1300    Visit Number 4    Number of Visits 9    Date for PT Re-Evaluation 04/09/20    Authorization Type [place] at visit 6 and visit 10    PT Start Time 1215    PT Stop Time 1256    PT Time Calculation (min) 41 min    Activity Tolerance Patient tolerated treatment well    Behavior During Therapy [facility] for tasks assessed/performed           Past Medical History:  Diagnosis Date  . Diabetes type 2, controlled ([place])   . Diverticulitis   . GERD (gastroesophageal reflux disease)   . Hypertension   . PONV (postoperative nausea and vomiting)   . RA (rheumatoid arthritis) ([place])   . SVD (spontaneous vaginal delivery)    x 3  . Umbilical hernia     Past Surgical History:  Procedure Laterality Date  . ABDOMINAL HYSTERECTOMY    . ABDOMINOPLASTY    . APPENDECTOMY    . HERNIA REPAIR    . LAPAROSCOPIC APPENDECTOMY N/A 02/11/2014   Procedure: APPENDECTOMY LAPAROSCOPIC;  Surgeon: Autumn Messing III, MD;  Location: [facility];  Service: General;  Laterality: N/A;  . ROBOTIC ASSISTED LAPAROSCOPIC OVARIAN CYSTECTOMY Left 04/13/2014   Procedure: ROBOTIC ASSISTED LAPAROSCOPIC Left OVARIAN CYSTECTOMY, BILATERAL SALPINGECTOMY,lysis of adhesions;  Surgeon: Princess Bruins, MD;  Location: [facility];  Service: Gynecology;  Laterality: Left;  . WISDOM TOOTH EXTRACTION      There were no vitals filed for this visit.   Subjective Assessment - 03/28/20 1219    Subjective "Today's been a decent day. The weekend was off and on, but I was able to walk a good 45 minutes to an hour on uneven ground and felt fine after that too."     Pertinent History see extensive list of pertinent PMH    Limitations Sitting;Lifting;Standing;Walking;House hold activities    How long can you sit comfortably? Needs to move at least once an hour to readjust/get comfortable    How long can you stand comfortably? 15 minutes at the most before feeling aching and numbness down left leg    How long can you walk comfortably? 5 minutes before feeling a "catching" in L anterior hip    Patient Stated Goals "just want to get back to normal household things", walking around stores, standing, sitting for work    Currently in Pain? Yes    Pain Score 3     Pain Location Hip   L SI joint to L hip   Pain Orientation Left;Proximal    Pain Descriptors / Indicators Aching;Dull;Sore    Pain Onset More than a month ago              [facility] PT Assessment - 03/28/20 0001      Assessment   Medical Diagnosis  Lumbago with sciatica, left side M54.42, Other chronic pain G89.29    Referring Provider (PT) Than, Elmyra Ricks, MD                          [facility] Adult PT Treatment/Exercise - 03/28/20  0001      Self-Care   Self-Care Other Self-Care Comments    Other Self-Care Comments  Reviewed HEP and progression of exercises; discussed that TPDN is still an available option in the future, explained use of foam roller and stretch strap for ITB and VL tightness.      Knee/Hip Exercises: Stretches   ITB Stretch Left;2 reps;30 seconds    ITB Stretch Limitations with green stretch strap      Knee/Hip Exercises: Aerobic   Nustep L7 x 5 min      Knee/Hip Exercises: Standing   Heel Raises 20 reps;Both    Heel Raises Limitations on foam pad    Forward Lunges Both;5 reps    Hip Extension Stengthening;Left;20 reps;Knee straight    Extension Limitations 3# ankle weight    Forward Step Up Left;20 reps;Step Height: 8"    Forward Step Up Limitations with RLE hip flexion after step up then back down (LLE as stance leg on step); pt felt "pinching" at proximal  L hip flexor during initiation of concentric and eccentric contraction (beginning of step up and just before descent)    Step Down Right;15 reps;Step Height: 6"    Step Down Limitations Heel taps     Functional Squat 10 reps    Functional Squat Limitations on blue side of BOSU     SLS LLE SLS on BOSU ball 3 x 20 sec      Knee/Hip Exercises: Supine   Bridges Strengthening;Both;20 reps    Bridges Limitations BLE on green physioball    Straight Leg Raises Strengthening;Left;20 reps    Straight Leg Raises Limitations 3# ankle weight    Other Supine Knee/Hip Exercises 30x dead bugs (15x each UE/LE) - 2# ankle weights      Knee/Hip Exercises: Sidelying   Hip ABduction Strengthening;Left;20 reps    Hip ABduction Limitations 3# ankle weight      Manual Therapy   Manual Therapy Soft tissue mobilization;Myofascial release    Manual therapy comments IASTM along L glute med and piriformis    Soft tissue mobilization Roller along L hip flexors, IT band, TFL, and glute med    Myofascial Release STM and myofascial release along L glue med and piriformis                  PT Education - 03/28/20 1304    Education Details Reviewed HEP and progression of exercises; discussed that TPDN is still an available option in the future, explained use of foam roller and stretch strap for ITB and VL tightness.    Person(s) Educated Patient    Methods Explanation;Demonstration;Tactile cues    Comprehension Verbalized understanding;Returned demonstration            PT Short Term Goals - 03/10/20 1842      PT SHORT TERM GOAL #1   Title Patient will be independent with initial HEP.    Baseline Pt given HEP at initial evaluation 03/10/2020    Time 2    Period Weeks    Status New    Target Date 03/24/20      PT SHORT TERM GOAL #2   Title Pt will be able to walk >10 minutes without significant increase in pain.    Baseline Pt has significant pain after 5 minutes of walking.    Time 2    Period  Weeks    Status New    Target Date 03/24/20      PT SHORT TERM GOAL #3  Title Pt will be able to stand for 30 minutes without significant increase in pain.    Baseline Pt has significant pain after 20 minutes of standing.    Time 2    Period Weeks    Status New    Target Date 03/24/20      PT SHORT TERM GOAL #4   Title Patient will be able to perform > 30 degrees lumbar flexion in standing with < 3/10 pain in L groin.    Baseline Pt has ~5/10 pain with 30 degrees lumbar flexion in standing.    Time 2    Period Weeks    Status New             PT Long Term Goals - 03/10/20 1846      PT LONG TERM GOAL #1   Title Patient will be independent with advanced HEP.    Baseline Patient currently received initial HEP on eval 03/10/2020.    Time 4    Period Weeks    Status New    Target Date 04/07/20      PT LONG TERM GOAL #2   Title Patient will improve FOTO score from 68% limited to 50% limited or better.    Baseline 68% limited; predicted 44% limited    Time 4    Period Weeks    Status New    Target Date 04/07/20      PT LONG TERM GOAL #3   Title Patient will be able to sit for at least 2 hours without having significant increase in pain.    Baseline Patient currently hasto readjust constantly and gets up at least once per hour when sitting.    Time 4    Period Weeks    Status New    Target Date 04/07/20      PT LONG TERM GOAL #4   Title Patient will be able to perform community ambulation with normalized gait pattern for at least 1 hour without needing to take a seated rest break.    Baseline Pt currently is able to tolerate 5 minutes of walking before pain becomes severe. Pt demonstrates antalgic gait.    Time 4    Period Weeks    Status New    Target Date 04/07/20                 Plan - 03/28/20 1300    Clinical Impression Statement Pt demonstrated good tolerance to interventions today with minimal complaints of fatigue but no increased pain. Pt has  tightness along L TFL, IT band, and hip flexors and TTP along L glute med and piriformis which was all addressed with STM, IASTM, use of roller, and stretches. Pt did not want to go forward with TPDN this session since symptoms were not aggravated but stated she may want to next session.    Personal Factors and Comorbidities Comorbidity 1;Comorbidity 3+;Comorbidity 2    Comorbidities see extensive list of pertinent PMH    Examination-Activity Limitations Bend;Bed Mobility;Lift;Locomotion Level;Sit;Sleep;Squat;Stairs;Stand    Examination-Participation Restrictions Community Activity;Driving;Cleaning;Shop    Stability/Clinical Decision Making Evolving/Moderate complexity    Rehab Potential Good    PT Frequency 2x / week    PT Duration 4 weeks    PT Treatment/Interventions ADLs/Self Care Home Management;Electrical Stimulation;Moist Heat;Neuromuscular re-education;Balance training;Therapeutic exercise;Therapeutic activities;Functional mobility training;Stair training;Gait training;Patient/family education;Manual techniques;Dry needling;Passive range of motion;Scar mobilization;Taping;Vasopneumatic Device    PT Next Visit Plan Discuss response after previous session, TPDN potentially, STM and myofascial release of  L glute med, piriformis, hip flexors, hip ABD, progress LLE strengthening and CKC exercises    PT Home Exercise Plan [id number] - piriformis stretch, bridges, sidelying hip ABD, modified Thomas stretch, supine ITB stretch, seated HS stretch, standing hip EXT, clamshell with green theraband, supine dead bug    Consulted and Agree with Plan of Care Patient           Patient will benefit from skilled therapeutic intervention in order to improve the following deficits and impairments:  Abnormal gait, Decreased activity tolerance, Decreased mobility, Decreased strength, Decreased scar mobility, Decreased endurance, Decreased range of motion, Difficulty walking  Visit Diagnosis: Difficulty in  walking, not elsewhere classified  Pain in left hip  Pain in left leg  Pain in left lower leg  Acute left-sided low back pain, unspecified whether sciatica present     Problem List Patient Active Problem List   Diagnosis Date Noted  . Ovarian cyst 04/13/2014  . Appendicitis, acute 02/11/2014    Haydee Monica, PT, DPT 03/28/20 1:11 PM  [facility] [address] [place], [place], [address] Phone: [phone]   Fax:  [phone]  Name: Lisa Novak MRN: [id number] Date of Birth: May 22, 1971

## 2020-03-30 ENCOUNTER — Ambulatory Visit: Payer: Commercial Managed Care - PPO

## 2020-03-30 ENCOUNTER — Other Ambulatory Visit: Payer: Self-pay

## 2020-03-30 DIAGNOSIS — M25552 Pain in left hip: Secondary | ICD-10-CM

## 2020-03-30 DIAGNOSIS — R262 Difficulty in walking, not elsewhere classified: Secondary | ICD-10-CM

## 2020-03-30 DIAGNOSIS — M79662 Pain in left lower leg: Secondary | ICD-10-CM

## 2020-03-30 DIAGNOSIS — M545 Low back pain, unspecified: Secondary | ICD-10-CM

## 2020-03-30 DIAGNOSIS — M79605 Pain in left leg: Secondary | ICD-10-CM

## 2020-03-30 NOTE — Therapy (Signed)
[place], [place], [address] Phone: [phone]   Fax:  [phone]  Physical Therapy Treatment  Patient Details  Name: Lisa Novak MRN: [id number] Date of Birth: 10-30-1970 Referring Provider (PT): Than, Elmyra Ricks, MD    Encounter Date: 03/30/2020   PT End of Session - 03/30/20 1453    Visit Number 5    Number of Visits 9    Date for PT Re-Evaluation 04/09/20    Authorization Type [place] at visit 6 and visit 10    PT Start Time 1446   pt arrived late   PT Stop Time 1526    PT Time Calculation (min) 40 min    Activity Tolerance Patient tolerated treatment well    Behavior During Therapy [facility] for tasks assessed/performed           Past Medical History:  Diagnosis Date  . Diabetes type 2, controlled ([place])   . Diverticulitis   . GERD (gastroesophageal reflux disease)   . Hypertension   . PONV (postoperative nausea and vomiting)   . RA (rheumatoid arthritis) ([place])   . SVD (spontaneous vaginal delivery)    x 3  . Umbilical hernia     Past Surgical History:  Procedure Laterality Date  . ABDOMINAL HYSTERECTOMY    . ABDOMINOPLASTY    . APPENDECTOMY    . HERNIA REPAIR    . LAPAROSCOPIC APPENDECTOMY N/A 02/11/2014   Procedure: APPENDECTOMY LAPAROSCOPIC;  Surgeon: Autumn Messing III, MD;  Location: [facility];  Service: General;  Laterality: N/A;  . ROBOTIC ASSISTED LAPAROSCOPIC OVARIAN CYSTECTOMY Left 04/13/2014   Procedure: ROBOTIC ASSISTED LAPAROSCOPIC Left OVARIAN CYSTECTOMY, BILATERAL SALPINGECTOMY,lysis of adhesions;  Surgeon: Princess Bruins, MD;  Location: [facility];  Service: Gynecology;  Laterality: Left;  . WISDOM TOOTH EXTRACTION      There were no vitals filed for this visit.   Subjective Assessment - 03/30/20 1449    Subjective Pt reports feeling pretty good still with some continued soreness in L hip and pinching in L anterior groin region.    Pertinent History see  extensive list of pertinent PMH    Limitations Sitting;Lifting;Standing;Walking;House hold activities    How long can you sit comfortably? Needs to move at least once an hour to readjust/get comfortable    How long can you stand comfortably? 15 minutes at the most before feeling aching and numbness down left leg    How long can you walk comfortably? 5 minutes before feeling a "catching" in L anterior hip    Patient Stated Goals "just want to get back to normal household things", walking around stores, standing, sitting for work    Currently in Pain? Yes    Pain Score 4     Pain Location Hip   L SI joint   Pain Orientation Left;Proximal    Pain Descriptors / Indicators Aching;Dull;Sore    Pain Type Acute pain    Pain Onset More than a month ago              [facility] PT Assessment - 03/30/20 0001      Assessment   Medical Diagnosis  Lumbago with sciatica, left side M54.42, Other chronic pain G89.29    Referring Provider (PT) Than, Elmyra Ricks, MD                          [facility] Adult PT Treatment/Exercise - 03/30/20 0001  Self-Care   Self-Care Other Self-Care Comments    Other Self-Care Comments  Pt had to cancel next Tuesday's appointment and won't return to PT until next Thursday, so PT and pt discussed consistency with exercises and stretches at home over the next week. Education regarding use of handle of butterknife along L hip flexors for self-IASTM and STM to help with tightness/area where pt feels pinching.      Knee/Hip Exercises: Stretches   Hip Flexor Stretch Left;2 reps;30 seconds    Hip Flexor Stretch Limitations standing while holding ankle - cues for form    ITB Stretch Left;2 reps;30 seconds    ITB Stretch Limitations with green stretch strap      Knee/Hip Exercises: Aerobic   Nustep L6 x 6 min      Knee/Hip Exercises: Standing   Forward Step Up Left;20 reps;Step Height: 8"    Forward Step Up Limitations with RLE drive after step up with LLE; pt  did not experience pinching initially but began to feel it again in L groin during last 4 reps      Knee/Hip Exercises: Sidelying   Hip ABduction Strengthening;Left;20 reps    Hip ABduction Limitations 3# ankle weight      Manual Therapy   Manual Therapy Soft tissue mobilization;Myofascial release    Manual therapy comments STM and myofascial release along L glute med and proximal hip flexors    Soft tissue mobilization Roller along L hip flexors, IT band, TFL, and glute med                  PT Education - 03/30/20 1745    Education Details Pt had to cancel next Tuesday's appointment and won't return to PT until next Thursday, so PT and pt discussed consistency with exercises and stretches at home over the next week. Education regarding use of handle of butterknife along L hip flexors for self-IASTM and STM to help with tightness/area where pt feels pinching. Pt still interested in potential dry needling next session depending on severity of symptoms.    Person(s) Educated Patient    Methods Explanation;Demonstration    Comprehension Verbalized understanding;Returned demonstration            PT Short Term Goals - 03/10/20 1842      PT SHORT TERM GOAL #1   Title Patient will be independent with initial HEP.    Baseline Pt given HEP at initial evaluation 03/10/2020    Time 2    Period Weeks    Status New    Target Date 03/24/20      PT SHORT TERM GOAL #2   Title Pt will be able to walk >10 minutes without significant increase in pain.    Baseline Pt has significant pain after 5 minutes of walking.    Time 2    Period Weeks    Status New    Target Date 03/24/20      PT SHORT TERM GOAL #3   Title Pt will be able to stand for 30 minutes without significant increase in pain.    Baseline Pt has significant pain after 20 minutes of standing.    Time 2    Period Weeks    Status New    Target Date 03/24/20      PT SHORT TERM GOAL #4   Title Patient will be able to  perform > 30 degrees lumbar flexion in standing with < 3/10 pain in L groin.    Baseline Pt  has ~5/10 pain with 30 degrees lumbar flexion in standing.    Time 2    Period Weeks    Status New             PT Long Term Goals - 03/10/20 1846      PT LONG TERM GOAL #1   Title Patient will be independent with advanced HEP.    Baseline Patient currently received initial HEP on eval 03/10/2020.    Time 4    Period Weeks    Status New    Target Date 04/07/20      PT LONG TERM GOAL #2   Title Patient will improve FOTO score from 68% limited to 50% limited or better.    Baseline 68% limited; predicted 44% limited    Time 4    Period Weeks    Status New    Target Date 04/07/20      PT LONG TERM GOAL #3   Title Patient will be able to sit for at least 2 hours without having significant increase in pain.    Baseline Patient currently hasto readjust constantly and gets up at least once per hour when sitting.    Time 4    Period Weeks    Status New    Target Date 04/07/20      PT LONG TERM GOAL #4   Title Patient will be able to perform community ambulation with normalized gait pattern for at least 1 hour without needing to take a seated rest break.    Baseline Pt currently is able to tolerate 5 minutes of walking before pain becomes severe. Pt demonstrates antalgic gait.    Time 4    Period Weeks    Status New    Target Date 04/07/20                 Plan - 03/30/20 1747    Clinical Impression Statement Pt continues to have TTP in L glute med, proximal hip flexors, and along IT band. Manual therapy was emphasized during today's session to allow for STM and myofascial release of trigger points in L glute med and at proximal hip flexors. Pt experienced relief with manual therapy followed by stretches. Pt was able to perform step ups without feeling pinching in L groin until last 4 reps today. Pt stated she would be interested in dry needling depending on severity of symptoms  during next session.    Personal Factors and Comorbidities Comorbidity 1;Comorbidity 3+;Comorbidity 2    Comorbidities see extensive list of pertinent PMH    Examination-Activity Limitations Bend;Bed Mobility;Lift;Locomotion Level;Sit;Sleep;Squat;Stairs;Stand    Examination-Participation Restrictions Community Activity;Driving;Cleaning;Shop    Stability/Clinical Decision Making Evolving/Moderate complexity    Rehab Potential Good    PT Frequency 2x / week    PT Duration 4 weeks    PT Treatment/Interventions ADLs/Self Care Home Management;Electrical Stimulation;Moist Heat;Neuromuscular re-education;Balance training;Therapeutic exercise;Therapeutic activities;Functional mobility training;Stair training;Gait training;Patient/family education;Manual techniques;Dry needling;Passive range of motion;Scar mobilization;Taping;Vasopneumatic Device    PT Next Visit Plan Discuss response after previous session, TPDN potentially, STM and myofascial release of L glute med, piriformis, hip flexors, hip ABD, progress LLE strengthening and CKC exercises    PT Home Exercise Plan [id number] - piriformis stretch, bridges, sidelying hip ABD, modified Thomas stretch, supine ITB stretch, seated HS stretch, standing hip EXT, clamshell with green theraband, supine dead bug    Consulted and Agree with Plan of Care Patient           Patient will benefit  from skilled therapeutic intervention in order to improve the following deficits and impairments:  Abnormal gait, Decreased activity tolerance, Decreased mobility, Decreased strength, Decreased scar mobility, Decreased endurance, Decreased range of motion, Difficulty walking  Visit Diagnosis: Difficulty in walking, not elsewhere classified  Pain in left hip  Pain in left leg  Pain in left lower leg  Acute left-sided low back pain, unspecified whether sciatica present     Problem List Patient Active Problem List   Diagnosis Date Noted  . Ovarian cyst  04/13/2014  . Appendicitis, acute 02/11/2014    Haydee Monica, PT, DPT 03/30/20 5:54 PM  [facility] [address] [place], [place], [address] Phone: [phone]   Fax:  [phone]  Name: Lisa Novak MRN: [id number] Date of Birth: 25-Sep-1970

## 2020-04-07 ENCOUNTER — Other Ambulatory Visit: Payer: Self-pay

## 2020-04-07 ENCOUNTER — Ambulatory Visit: Payer: Commercial Managed Care - PPO

## 2020-04-07 DIAGNOSIS — M545 Low back pain, unspecified: Secondary | ICD-10-CM

## 2020-04-07 DIAGNOSIS — R262 Difficulty in walking, not elsewhere classified: Secondary | ICD-10-CM

## 2020-04-07 DIAGNOSIS — M79662 Pain in left lower leg: Secondary | ICD-10-CM

## 2020-04-07 DIAGNOSIS — M79605 Pain in left leg: Secondary | ICD-10-CM

## 2020-04-07 DIAGNOSIS — M25552 Pain in left hip: Secondary | ICD-10-CM

## 2020-04-07 NOTE — Therapy (Signed)
[place], [place], [address] Phone: [phone]   Fax:  [phone]  Physical Therapy Treatment/Re-Evaluation  Patient Details  Name: Lisa Novak MRN: [id number] Date of Birth: [birth year] Referring Provider (PT): Than, Elmyra Ricks, MD    Encounter Date: 04/07/2020   PT End of Session - 04/07/20 0932    Visit Number 6    Number of Visits 17    Date for PT Re-Evaluation 05/07/20    Authorization Type [place] at visit 10    PT Start Time [phone]   pt arrived late   PT Stop Time 0958    PT Time Calculation (min) 40 min    Activity Tolerance Patient tolerated treatment well    Behavior During Therapy [facility] for tasks assessed/performed           Past Medical History:  Diagnosis Date  . Diabetes type 2, controlled ([place])   . Diverticulitis   . GERD (gastroesophageal reflux disease)   . Hypertension   . PONV (postoperative nausea and vomiting)   . RA (rheumatoid arthritis) ([place])   . SVD (spontaneous vaginal delivery)    x 3  . Umbilical hernia     Past Surgical History:  Procedure Laterality Date  . ABDOMINAL HYSTERECTOMY    . ABDOMINOPLASTY    . APPENDECTOMY    . HERNIA REPAIR    . LAPAROSCOPIC APPENDECTOMY N/A 02/11/2014   Procedure: APPENDECTOMY LAPAROSCOPIC;  Surgeon: Autumn Messing III, MD;  Location: [facility];  Service: General;  Laterality: N/A;  . ROBOTIC ASSISTED LAPAROSCOPIC OVARIAN CYSTECTOMY Left 04/13/2014   Procedure: ROBOTIC ASSISTED LAPAROSCOPIC Left OVARIAN CYSTECTOMY, BILATERAL SALPINGECTOMY,lysis of adhesions;  Surgeon: Princess Bruins, MD;  Location: [facility];  Service: Gynecology;  Laterality: Left;  . WISDOM TOOTH EXTRACTION      There were no vitals filed for this visit.   Subjective Assessment - 04/07/20 0937    Subjective "It is really tender today." Pt reports hosting a birthday party for her husband over the weekend for which she was standing majority of the  evening. She states that she had 2 days after the party where she could barely stand up afterwards.    Pertinent History see extensive list of pertinent PMH    Limitations Sitting;Lifting;Standing;Walking;House hold activities    How long can you sit comfortably? Needs to move at least once an hour to readjust/get comfortable    How long can you stand comfortably? 15 minutes at the most before feeling aching and numbness down left leg    How long can you walk comfortably? 5 minutes before feeling a "catching" in L anterior hip    Patient Stated Goals "just want to get back to normal household things", walking around stores, standing, sitting for work    Currently in Pain? Yes    Pain Score 7     Pain Location Hip    Pain Orientation Left;Proximal    Pain Descriptors / Indicators Other (Comment)   pinching   Pain Type Acute pain    Pain Onset More than a month ago              [facility] PT Assessment - 04/07/20 0001      Assessment   Medical Diagnosis  Lumbago with sciatica, left side M54.42, Other chronic pain G89.29    Referring Provider (PT) Than, Elmyra Ricks, MD       Observation/Other Assessments   Focus on Therapeutic Outcomes (FOTO)  60%  limited; predicted 44% limited      AROM   Lumbar Flexion 80 without pinching in L groin    Lumbar Extension 30    Lumbar - Right Side Bend 20    Lumbar - Left Side Bend 20 with pinching in L groin    Lumbar - Right Rotation WFL    Lumbar - Left Rotation WFL but pinching in L groin      Strength   Overall Strength Comments B knee and ankle MMT 5/5 grossly    Right Hip Flexion 5/5    Left Hip Flexion 5/5    Left Hip ABduction 4+/5                         [facility] Adult PT Treatment/Exercise - 04/07/20 0001      Posture/Postural Control   Posture Comments Antalgic gait upon arrival today; pt demonstrated more normalized gait pattern leaving the clinic due to relief from TPDN followed up with interventions      Self-Care    Self-Care Other Self-Care Comments    Other Self-Care Comments  Pt education regarding TPDN and expected response, HEP, updated FOTO score.      Knee/Hip Exercises: Aerobic   Stationary Bike L4 x 5 min      Knee/Hip Exercises: Standing   Forward Step Up Left;20 reps;Step Height: 8"    Forward Step Up Limitations with RLE drive; pt able to perform without pinching in L groin today    Rocker Board 2 minutes    Rocker Board Limitations Brief rest break after 1 min; blue tilt board maintaining balance in neutral position    SLS LLE SLS on dynadisc 3 x 10 sec    Other Standing Knee Exercises Wall sit 2 x 20 seconds      Manual Therapy   Manual Therapy Joint mobilization;Soft tissue mobilization    Manual therapy comments Skilled observation and palpation performed with TPDN by [place].    Joint Mobilization Grades III and IV joint mobs at L transverse process lower lumbar spine to facilitate R rotation due to aggravated symptoms during L sidebending and EXT - pt able to perform these motions without pinching following joint mobs    Soft tissue mobilization Gentle STM along L piriformis briefly to follow up after TPDN            Trigger Point Dry Needling - 04/07/20 0001    Consent Given? Yes    Education Handout Provided --   Verbal education provided   Muscles Treated Back/Hip Piriformis    Piriformis Response Twitch response elicited                PT Education - 04/07/20 1028    Education Details Pt education regarding TPDN and expected response, HEP, updated FOTO score, continued POC.    Person(s) Educated Patient    Methods Explanation;Demonstration    Comprehension Verbalized understanding;Returned demonstration            PT Short Term Goals - 04/07/20 1024      PT SHORT TERM GOAL #1   Title Patient will be independent with initial HEP.    Baseline Pt given HEP at initial evaluation 03/10/2020    Time 2    Period Weeks    Status Achieved    Target  Date 03/24/20      PT SHORT TERM GOAL #2   Title Pt will be able to walk >10 minutes without  significant increase in pain.    Baseline Pt has significant pain after 5 minutes of walking.    Time 2    Period Weeks    Status Achieved    Target Date 03/24/20      PT SHORT TERM GOAL #3   Title Pt will be able to stand for 30 minutes without significant increase in pain.    Baseline Pt has significant pain after 20 minutes of standing; Pt stood for 3 hours for her husband's birthday party but had significant pain afterwards    Time 2    Period Weeks    Status On-going    Target Date 03/24/20      PT SHORT TERM GOAL #4   Title Patient will be able to perform > 30 degrees lumbar flexion in standing with < 3/10 pain in L groin.    Baseline Pt has ~5/10 pain with 30 degrees lumbar flexion in standing.    Time 2    Period Weeks    Status Achieved             PT Long Term Goals - 04/07/20 1025      PT LONG TERM GOAL #1   Title Patient will be independent with advanced HEP.    Baseline Patient currently received initial HEP on eval 03/10/2020. Update: continuing POC and will continue to add to HEP    Time 4    Period Weeks    Status On-going      PT LONG TERM GOAL #2   Title Patient will improve FOTO score from 68% limited to 50% limited or better.    Baseline 68% limited; predicted 44% limited. Update 04/07/20: 60% limitation today    Time 4    Period Weeks    Status On-going      PT LONG TERM GOAL #3   Title Patient will be able to sit for at least 2 hours without having significant increase in pain.    Baseline Patient currently has to readjust constantly and gets up at least once per hour when sitting.    Time 4    Period Weeks    Status On-going      PT LONG TERM GOAL #4   Title Patient will be able to perform community ambulation with normalized gait pattern for at least 1 hour without needing to take a seated rest break.    Baseline Pt currently is able to tolerate 5  minutes of walking before pain becomes severe. Pt demonstrates antalgic gait.    Time 4    Period Weeks    Status On-going                 Plan - 04/07/20 1016    Clinical Impression Statement Patient demonstrated good tolerance to TPDN of L piriformis followed by interventions today. Pt shows progress with lumbar AROM and improvement in hip MMT compared to initial evaluation. Pt's FOTO score reveals improvement from 68% limitation to 60% limitation from 03/10/2020 to 04/07/2020 with predicted 44% limtation. Pt has progressed well thus far with fluctuation of symptoms some days dependent on her activity level that week, but she has greater tolerance to activity overall. Ms. Coggins will benefit from continued skilled PT intervention to continue addressing deficits and working towards achieving functional goals.    Personal Factors and Comorbidities Comorbidity 1;Comorbidity 3+;Comorbidity 2    Comorbidities see extensive list of pertinent PMH    Examination-Activity Limitations Bend;Bed Mobility;Lift;Locomotion Level;Sit;Sleep;Squat;Stairs;Stand  Examination-Participation Restrictions Community Activity;Driving;Cleaning;Shop    Stability/Clinical Decision Making Evolving/Moderate complexity    Rehab Potential Good    PT Frequency 2x / week    PT Duration 4 weeks    PT Treatment/Interventions ADLs/Self Care Home Management;Electrical Stimulation;Moist Heat;Neuromuscular re-education;Balance training;Therapeutic exercise;Therapeutic activities;Functional mobility training;Stair training;Gait training;Patient/family education;Manual techniques;Dry needling;Passive range of motion;Scar mobilization;Taping;Vasopneumatic Device    PT Next Visit Plan Discuss response to TPDN previous session, manual therapy for L pirifomis and joint mobilizations of lumbar spine, balance on dynadisc and/or BOSU, swiss ball marches, squats on BOSU, leg press, piriformis stretch    PT Home Exercise Plan [id number] -  piriformis stretch, bridges, sidelying hip ABD, modified Thomas stretch, supine ITB stretch, seated HS stretch, standing hip EXT, clamshell with green theraband, supine dead bug    Consulted and Agree with Plan of Care Patient           Patient will benefit from skilled therapeutic intervention in order to improve the following deficits and impairments:  Abnormal gait, Decreased activity tolerance, Decreased mobility, Decreased strength, Decreased scar mobility, Decreased endurance, Decreased range of motion, Difficulty walking  Visit Diagnosis: Difficulty in walking, not elsewhere classified  Pain in left hip  Pain in left leg  Pain in left lower leg  Acute left-sided low back pain, unspecified whether sciatica present     Problem List Patient Active Problem List   Diagnosis Date Noted  . Ovarian cyst 04/13/2014  . Appendicitis, acute 02/11/2014    Haydee Monica, PT, DPT 04/07/20 10:30 AM  [facility] [address] [place], [place], [address] Phone: [phone]   Fax:  [phone]  Name: Lisa Novak MRN: [id number] Date of Birth: 09-Feb-1971

## 2020-04-26 ENCOUNTER — Other Ambulatory Visit: Payer: Self-pay

## 2020-04-26 ENCOUNTER — Ambulatory Visit: Payer: Commercial Managed Care - PPO

## 2020-04-26 DIAGNOSIS — R262 Difficulty in walking, not elsewhere classified: Secondary | ICD-10-CM | POA: Diagnosis not present

## 2020-04-26 DIAGNOSIS — M79605 Pain in left leg: Secondary | ICD-10-CM

## 2020-04-26 DIAGNOSIS — M25552 Pain in left hip: Secondary | ICD-10-CM

## 2020-04-26 DIAGNOSIS — M79662 Pain in left lower leg: Secondary | ICD-10-CM

## 2020-04-26 DIAGNOSIS — M545 Low back pain, unspecified: Secondary | ICD-10-CM

## 2020-04-26 NOTE — Therapy (Addendum)
[place], [place], [address] Phone: [phone]   Fax:  [phone]  Physical Therapy Treatment  Patient Details  Name: Lisa Novak MRN: [id number] Date of Birth: 13-Oct-1970 Referring Provider (PT): Than, Elmyra Ricks, MD    Encounter Date: 04/26/2020   PT End of Session - 04/26/20 1219    Visit Number 7    Number of Visits 17    Date for PT Re-Evaluation 05/07/20    Authorization Type [place] at visit 10    PT Start Time 1220   pt arrived late   PT Stop Time 1301    PT Time Calculation (min) 41 min    Activity Tolerance Patient tolerated treatment well    Behavior During Therapy [facility] for tasks assessed/performed           Past Medical History:  Diagnosis Date  . Diabetes type 2, controlled ([place])   . Diverticulitis   . GERD (gastroesophageal reflux disease)   . Hypertension   . PONV (postoperative nausea and vomiting)   . RA (rheumatoid arthritis) ([place])   . SVD (spontaneous vaginal delivery)    x 3  . Umbilical hernia     Past Surgical History:  Procedure Laterality Date  . ABDOMINAL HYSTERECTOMY    . ABDOMINOPLASTY    . APPENDECTOMY    . HERNIA REPAIR    . LAPAROSCOPIC APPENDECTOMY N/A 02/11/2014   Procedure: APPENDECTOMY LAPAROSCOPIC;  Surgeon: Autumn Messing III, MD;  Location: [facility];  Service: General;  Laterality: N/A;  . ROBOTIC ASSISTED LAPAROSCOPIC OVARIAN CYSTECTOMY Left 04/13/2014   Procedure: ROBOTIC ASSISTED LAPAROSCOPIC Left OVARIAN CYSTECTOMY, BILATERAL SALPINGECTOMY,lysis of adhesions;  Surgeon: Princess Bruins, MD;  Location: [facility];  Service: Gynecology;  Laterality: Left;  . WISDOM TOOTH EXTRACTION      There were no vitals filed for this visit.   Subjective Assessment - 04/26/20 1223    Subjective "It's been a rough past few days." Patient explains that her symptoms have not improved over the past few weeks with some good days but no great days.  Patient explains that there have been days she is unable to walk from her car to her house after performing light functional activities due to increased pain. She plans to call her doctor's office today to request a follow up due to persistent pain without relief. Pt states her muscular tightness and pain is no longer present but she has almost constant tenderness along L SI joint and limited tolerance to standing.    Pertinent History see extensive list of pertinent PMH    Limitations Sitting;Lifting;Standing;Walking;House hold activities    How long can you sit comfortably? Needs to move at least once an hour to readjust/get comfortable    How long can you stand comfortably? 15 minutes at the most before feeling aching and numbness down left leg    How long can you walk comfortably? 5 minutes before feeling a "catching" in L anterior hip    Patient Stated Goals "just want to get back to normal household things", walking around stores, standing, sitting for work    Currently in Pain? Yes    Pain Score 8     Pain Location Hip    Pain Orientation Left;Proximal    Pain Type Acute pain    Pain Onset More than a month ago              [facility] PT Assessment - 04/26/20 0001  Assessment   Medical Diagnosis  Lumbago with sciatica, left side M54.42, Other chronic pain G89.29    Referring Provider (PT) Than, Elmyra Ricks, MD       Special Tests   Other special tests Long sit test revealed anterior rotation of R innominate that was not apparent following MET (R HS and L quads) x 8                         [facility] Adult PT Treatment/Exercise - 04/26/20 0001      Self-Care   Self-Care Other Self-Care Comments    Other Self-Care Comments  Updated HEP, discussed follow up with doctor due to persistent pain/symptoms without relief      Exercises   Exercises Lumbar      Lumbar Exercises: Seated   Other Seated Lumbar Exercises Hip hinge with dowel x 15      Lumbar Exercises:  Sidelying   Clam Left;20 reps    Clam Limitations green theraband    Hip Abduction Left;20 reps;Weights    Hip Abduction Weights (lbs) 2      Lumbar Exercises: Quadruped   [place]/Old Horse 20 reps    Opposite Arm/Leg Raise Limitations Bird dog x 15 each UE/LE    Other Quadruped Lumbar Exercises Fire hydrant x 20 each LE and cues for core activation      Knee/Hip Exercises: Aerobic   Nustep L6 x 6 min      Knee/Hip Exercises: Standing   Functional Squat 20 reps      Knee/Hip Exercises: Supine   Bridges with Ball Squeeze Strengthening;Both;15 reps    Straight Leg Raises AROM;Strengthening;Left;20 reps    Straight Leg Raises Limitations 2# ankle weight      Manual Therapy   Manual Therapy Muscle Energy Technique    Muscle Energy Technique Long sit test revealed anterior rotation of R innominate that was not apparent following MET (R HS and L quads) x 8                  PT Education - 04/26/20 1317    Education Details Updated HEP, discussed follow up with doctor due to persistent pain/symptoms without relief    Person(s) Educated Patient    Methods Explanation;Demonstration;Handout    Comprehension Verbalized understanding;Returned demonstration            PT Short Term Goals - 04/07/20 1024      PT SHORT TERM GOAL #1   Title Patient will be independent with initial HEP.    Baseline Pt given HEP at initial evaluation 03/10/2020    Time 2    Period Weeks    Status Achieved    Target Date 03/24/20      PT SHORT TERM GOAL #2   Title Pt will be able to walk >10 minutes without significant increase in pain.    Baseline Pt has significant pain after 5 minutes of walking.    Time 2    Period Weeks    Status Achieved    Target Date 03/24/20      PT SHORT TERM GOAL #3   Title Pt will be able to stand for 30 minutes without significant increase in pain.    Baseline Pt has significant pain after 20 minutes of standing; Pt stood for 3 hours for her husband's  birthday party but had significant pain afterwards    Time 2    Period Weeks    Status On-going  Target Date 03/24/20      PT SHORT TERM GOAL #4   Title Patient will be able to perform > 30 degrees lumbar flexion in standing with < 3/10 pain in L groin.    Baseline Pt has ~5/10 pain with 30 degrees lumbar flexion in standing.    Time 2    Period Weeks    Status Achieved             PT Long Term Goals - 04/26/20 1318      PT LONG TERM GOAL #1   Title Patient will be independent with advanced HEP.    Baseline Patient currently received initial HEP on eval 03/10/2020. Update: continuing POC and will continue to add to HEP    Time 4    Period Weeks    Status On-going      PT LONG TERM GOAL #2   Title Patient will improve FOTO score from 68% limited to 50% limited or better.    Baseline 68% limited; predicted 44% limited. Update 04/07/20: 60% limitation today    Time 4    Period Weeks    Status On-going      PT LONG TERM GOAL #3   Title Patient will be able to sit for at least 2 hours without having significant increase in pain.    Baseline Patient currently has to readjust constantly and gets up at least once per hour when sitting.    Time 4    Period Weeks    Status On-going      PT LONG TERM GOAL #4   Title Patient will be able to perform community ambulation with normalized gait pattern for at least 1 hour without needing to take a seated rest break.    Baseline Pt currently is able to tolerate 5 minutes of walking before pain becomes severe. Pt demonstrates antalgic gait.    Time 4    Period Weeks    Status On-going                 Plan - 04/26/20 1304    Clinical Impression Statement Patient tolerated treatment session well but experienced continued tenderness and pain at L SI joint. Pt reports feeling slightly better upon standing but symptoms remained persistent at a decreased intensity. PT and patient discussed patient reaching out to her doctor for a  follow up visit due to her consistent pain and functional limitation.    Personal Factors and Comorbidities Comorbidity 1;Comorbidity 3+;Comorbidity 2    Comorbidities see extensive list of pertinent PMH    Examination-Activity Limitations Bend;Bed Mobility;Lift;Locomotion Level;Sit;Sleep;Squat;Stairs;Stand    Examination-Participation Restrictions Community Activity;Driving;Cleaning;Shop    Stability/Clinical Decision Making Evolving/Moderate complexity    Rehab Potential Good    PT Frequency 2x / week    PT Duration 4 weeks    PT Treatment/Interventions ADLs/Self Care Home Management;Electrical Stimulation;Moist Heat;Neuromuscular re-education;Balance training;Therapeutic exercise;Therapeutic activities;Functional mobility training;Stair training;Gait training;Patient/family education;Manual techniques;Dry needling;Passive range of motion;Scar mobilization;Taping;Vasopneumatic Device    PT Next Visit Plan Assess STG. Review updated HEP, L sciatic nerve glides balance on dynadisc and/or BOSU, swiss ball marches, squats on BOSU, leg press, piriformis stretch    PT Home Exercise Plan [id number] - piriformis stretch, bridges, sidelying hip ABD, modified Thomas stretch, supine ITB stretch, seated HS stretch, standing hip EXT, clamshell with green theraband, supine dead bug, hip hinge (standing and seated), L sciatic nerve glides (seated and/or supine)    Consulted and Agree with Plan of Care Patient  Patient will benefit from skilled therapeutic intervention in order to improve the following deficits and impairments:  Abnormal gait, Decreased activity tolerance, Decreased mobility, Decreased strength, Decreased scar mobility, Decreased endurance, Decreased range of motion, Difficulty walking  Visit Diagnosis: Difficulty in walking, not elsewhere classified  Pain in left hip  Pain in left leg  Pain in left lower leg  Acute left-sided low back pain, unspecified whether sciatica  present     Problem List Patient Active Problem List   Diagnosis Date Noted  . Ovarian cyst 04/13/2014  . Appendicitis, acute 02/11/2014     Haydee Monica, PT, DPT 04/26/20 1:24 PM  [facility] [address] [place], [place], [address] Phone: [phone]   Fax:  [phone]  Name: Lisa Novak MRN: [id number] Date of Birth: Oct 07, 1970

## 2020-04-28 ENCOUNTER — Ambulatory Visit: Payer: Commercial Managed Care - PPO

## 2020-05-03 ENCOUNTER — Ambulatory Visit: Payer: Commercial Managed Care - PPO | Attending: Neurosurgery

## 2020-05-03 ENCOUNTER — Other Ambulatory Visit: Payer: Self-pay

## 2020-05-03 DIAGNOSIS — M79662 Pain in left lower leg: Secondary | ICD-10-CM | POA: Diagnosis present

## 2020-05-03 DIAGNOSIS — R262 Difficulty in walking, not elsewhere classified: Secondary | ICD-10-CM | POA: Diagnosis not present

## 2020-05-03 DIAGNOSIS — M25552 Pain in left hip: Secondary | ICD-10-CM

## 2020-05-03 DIAGNOSIS — M79605 Pain in left leg: Secondary | ICD-10-CM | POA: Insufficient documentation

## 2020-05-03 DIAGNOSIS — M545 Low back pain, unspecified: Secondary | ICD-10-CM | POA: Insufficient documentation

## 2020-05-03 NOTE — Therapy (Signed)
[place], [place], [address] Phone: [phone]   Fax:  [phone]  Physical Therapy Treatment  Patient Details  Name: Lisa Novak MRN: [id number] Date of Birth: 11/21/1970 Referring Provider (PT): Than, Elmyra Ricks, MD    Encounter Date: 05/03/2020   PT End of Session - 05/03/20 1059    Visit Number 8    Number of Visits 17    Date for PT Re-Evaluation 05/07/20    Authorization Type [place] at visit 10    PT Start Time [phone]   pt arrived late - wanted to continue with session   PT Stop Time 0959    PT Time Calculation (min) 26 min    Activity Tolerance Patient tolerated treatment well    Behavior During Therapy [facility] for tasks assessed/performed           Past Medical History:  Diagnosis Date  . Diabetes type 2, controlled ([place])   . Diverticulitis   . GERD (gastroesophageal reflux disease)   . Hypertension   . PONV (postoperative nausea and vomiting)   . RA (rheumatoid arthritis) ([place])   . SVD (spontaneous vaginal delivery)    x 3  . Umbilical hernia     Past Surgical History:  Procedure Laterality Date  . ABDOMINAL HYSTERECTOMY    . ABDOMINOPLASTY    . APPENDECTOMY    . HERNIA REPAIR    . LAPAROSCOPIC APPENDECTOMY N/A 02/11/2014   Procedure: APPENDECTOMY LAPAROSCOPIC;  Surgeon: Autumn Messing III, MD;  Location: [place];  Service: General;  Laterality: N/A;  . ROBOTIC ASSISTED LAPAROSCOPIC OVARIAN CYSTECTOMY Left 04/13/2014   Procedure: ROBOTIC ASSISTED LAPAROSCOPIC Left OVARIAN CYSTECTOMY, BILATERAL SALPINGECTOMY,lysis of adhesions;  Surgeon: Princess Bruins, MD;  Location: [facility];  Service: Gynecology;  Laterality: Left;  . WISDOM TOOTH EXTRACTION      There were no vitals filed for this visit.   Subjective Assessment - 05/03/20 0937    Subjective "It's been a really good day and really good week. I haven't done much of anything, so that's probably why. I was sore  after the last session, but that's expected. I still have tenderness at my SI joint but it's not like it was before. If I stand too long, I do get numbness down my left leg that sometimes goes to my middle toes." Pt reports she is still waiting to hear back from her doctor regarding a follow up due to continued symptom aggravation. She will be helping her daughter move the week of 05/16/2020 and will performing increased activity during that time.    Pertinent History see extensive list of pertinent PMH    Limitations Sitting;Lifting;Standing;Walking;House hold activities    How long can you sit comfortably? Needs to move at least once an hour to readjust/get comfortable    How long can you stand comfortably? 15 minutes at the most before feeling aching and numbness down left leg    How long can you walk comfortably? 5 minutes before feeling a "catching" in L anterior hip    Patient Stated Goals "just want to get back to normal household things", walking around stores, standing, sitting for work    Currently in Pain? Yes    Pain Score 2     Pain Location Sacrum   SI joint   Pain Orientation Left;Proximal    Pain Descriptors / Indicators Aching    Pain Type Chronic pain    Pain Onset More than a month  ago              [facility] PT Assessment - 05/03/20 0001      Assessment   Medical Diagnosis  Lumbago with sciatica, left side M54.42, Other chronic pain G89.29    Referring Provider (PT) Than, Elmyra Ricks, MD                          [facility] Adult PT Treatment/Exercise - 05/03/20 0001      Lumbar Exercises: Standing   Other Standing Lumbar Exercises Hip hinge with dowel x 15    Other Standing Lumbar Exercises Alternating marches on BOSU (blue) x 30 (15x each LE)      Lumbar Exercises: Seated   Other Seated Lumbar Exercises Seated L sciatic nerve glides x 15 then L sciatic nerve tensioner x 15      Lumbar Exercises: Quadruped   Other Quadruped Lumbar Exercises Fire hydrant x 20  each LE and cues for core activation      Knee/Hip Exercises: Aerobic   Nustep L6 x 6 min      Knee/Hip Exercises: Standing   Hip Flexion Limitations Alternating marches 30x (15x each LE) on BOSU (blue)    Hip Extension AROM;Stengthening;Both;15 reps;Knee straight    Forward Step Up Left;2 sets;10 reps;Step Height: 6"    Forward Step Up Limitations with RLE drive    SLS LLE SLS on BOSU (blue) attempted 5 x 5 sec with use of UE to maintain balance as needed                  PT Education - 05/03/20 1058    Education Details Reviewed HEP and discussed consistency with interventions. Reviewed STG and LTG with patient and POC - pt is waiting to hear back from her doctor regarding a follow up due to persistent symptoms.    Person(s) Educated Patient    Methods Explanation;Demonstration    Comprehension Verbalized understanding;Returned demonstration            PT Short Term Goals - 05/03/20 1048      PT SHORT TERM GOAL #1   Title Patient will be independent with initial HEP.    Baseline Pt given HEP at initial evaluation 03/10/2020    Time 2    Period Weeks    Status Achieved    Target Date 03/24/20      PT SHORT TERM GOAL #2   Title Pt will be able to walk >10 minutes without significant increase in pain.    Baseline Pt has significant pain after 5 minutes of walking.    Time 2    Period Weeks    Status Achieved    Target Date 03/24/20      PT SHORT TERM GOAL #3   Title Pt will be able to stand for 30 minutes without significant increase in pain.    Baseline Pt has significant pain after 20 minutes of standing; Pt stood for 3 hours for her husband's birthday party but had significant pain afterwards. 05/03/2020: pt still has increased pain in L SI joint/low back with symptoms down LLE before 30 min when walking    Time 2    Period Weeks    Status On-going    Target Date 03/24/20      PT SHORT TERM GOAL #4   Title Patient will be able to perform > 30 degrees lumbar  flexion in standing with < 3/10 pain in L  groin.    Baseline Pt has ~5/10 pain with 30 degrees lumbar flexion in standing.    Time 2    Period Weeks    Status Achieved             PT Long Term Goals - 05/03/20 1049      PT LONG TERM GOAL #1   Title Patient will be independent with advanced HEP.    Baseline Patient currently received initial HEP on eval 03/10/2020. Update: continuing POC and will continue to add to HEP    Time 4    Period Weeks    Status On-going      PT LONG TERM GOAL #2   Title Patient will improve FOTO score from 68% limited to 50% limited or better.    Baseline 68% limited; predicted 44% limited. Update 04/07/20: 60% limitation today. 05/03/20: will reassess FOTO at visit 10.    Time 4    Period Weeks    Status On-going      PT LONG TERM GOAL #3   Title Patient will be able to sit for at least 2 hours without having significant increase in pain.    Baseline Patient currently has to readjust constantly and gets up at least once per hour when sitting. 05/03/20: pt still readjusts often while sitting    Time 4    Period Weeks    Status On-going      PT LONG TERM GOAL #4   Title Patient will be able to perform community ambulation with normalized gait pattern for at least 1 hour without needing to take a seated rest break.    Baseline Pt currently is able to tolerate 5 minutes of walking before pain becomes severe. Pt demonstrates antalgic gait.    Time 4    Period Weeks    Status On-going                 Plan - 05/03/20 1052    Clinical Impression Statement Patient tolerated treatment session well with no adverse effects or complaints of significant pain. Treatment session was limited today due to patient arriving late, but she was able to perform interventions without aggravation of symptoms. Goals were reassessed but pt continues to have limitations with prolonged sitting and standing.    Personal Factors and Comorbidities Comorbidity 1;Comorbidity  3+;Comorbidity 2    Comorbidities see extensive list of pertinent PMH    Examination-Activity Limitations Bend;Bed Mobility;Lift;Locomotion Level;Sit;Sleep;Squat;Stairs;Stand    Examination-Participation Restrictions Community Activity;Driving;Cleaning;Shop    Stability/Clinical Decision Making Evolving/Moderate complexity    Rehab Potential Good    PT Frequency 2x / week    PT Duration 4 weeks    PT Treatment/Interventions ADLs/Self Care Home Management;Electrical Stimulation;Moist Heat;Neuromuscular re-education;Balance training;Therapeutic exercise;Therapeutic activities;Functional mobility training;Stair training;Gait training;Patient/family education;Manual techniques;Dry needling;Passive range of motion;Scar mobilization;Taping;Vasopneumatic Device    PT Next Visit Plan Re-evaluation next session. Review updated HEP, L sciatic nerve glides, progress/continue balance on dynadisc and/or BOSU, swiss ball marches, squats on BOSU, leg press, piriformis stretch    PT Home Exercise Plan [id number] - piriformis stretch, bridges, sidelying hip ABD, modified Thomas stretch, supine ITB stretch, seated HS stretch, standing hip EXT, clamshell with green theraband, supine dead bug, hip hinge (standing and seated), L sciatic nerve glides (seated and/or supine)    Consulted and Agree with Plan of Care Patient           Patient will benefit from skilled therapeutic intervention in order to improve the following deficits and impairments:  Abnormal gait, Decreased activity tolerance, Decreased mobility, Decreased strength, Decreased scar mobility, Decreased endurance, Decreased range of motion, Difficulty walking  Visit Diagnosis: Difficulty in walking, not elsewhere classified  Pain in left hip  Pain in left leg  Pain in left lower leg  Acute left-sided low back pain, unspecified whether sciatica present     Problem List Patient Active Problem List   Diagnosis Date Noted  . Ovarian cyst  04/13/2014  . Appendicitis, acute 02/11/2014     Haydee Monica, PT, DPT 05/03/20 11:04 AM  [facility] [address] [place], [place], [address] Phone: [phone]   Fax:  [phone]  Name: Mechele Kittleson MRN: [id number] Date of Birth: Jan 27, 1971

## 2020-05-05 ENCOUNTER — Other Ambulatory Visit: Payer: Self-pay

## 2020-05-05 ENCOUNTER — Ambulatory Visit: Payer: Commercial Managed Care - PPO

## 2020-05-05 DIAGNOSIS — R262 Difficulty in walking, not elsewhere classified: Secondary | ICD-10-CM

## 2020-05-05 DIAGNOSIS — M545 Low back pain, unspecified: Secondary | ICD-10-CM

## 2020-05-05 DIAGNOSIS — M79662 Pain in left lower leg: Secondary | ICD-10-CM

## 2020-05-05 DIAGNOSIS — M25552 Pain in left hip: Secondary | ICD-10-CM

## 2020-05-05 DIAGNOSIS — M79605 Pain in left leg: Secondary | ICD-10-CM

## 2020-05-05 NOTE — Therapy (Signed)
[place], [place], [address] Phone: [phone]   Fax:  [phone]  Physical Therapy Treatment/Discharge Summary  Patient Details  Name: Lisa Novak MRN: [id number] Date of Birth: 04/18/1971 Referring Provider (PT): Than, Elmyra Ricks, MD    Encounter Date: 05/05/2020   PT End of Session - 05/05/20 0919    Visit Number 9    Number of Visits 17    Date for PT Re-Evaluation 05/07/20    Authorization Type [place] at visit 10    PT Start Time 0915    PT Stop Time 0959    PT Time Calculation (min) 44 min    Activity Tolerance Patient tolerated treatment well    Behavior During Therapy [facility] for tasks assessed/performed           Past Medical History:  Diagnosis Date  . Diabetes type 2, controlled ([place])   . Diverticulitis   . GERD (gastroesophageal reflux disease)   . Hypertension   . PONV (postoperative nausea and vomiting)   . RA (rheumatoid arthritis) ([place])   . SVD (spontaneous vaginal delivery)    x 3  . Umbilical hernia     Past Surgical History:  Procedure Laterality Date  . ABDOMINAL HYSTERECTOMY    . ABDOMINOPLASTY    . APPENDECTOMY    . HERNIA REPAIR    . LAPAROSCOPIC APPENDECTOMY N/A 02/11/2014   Procedure: APPENDECTOMY LAPAROSCOPIC;  Surgeon: Autumn Messing III, MD;  Location: [place];  Service: General;  Laterality: N/A;  . ROBOTIC ASSISTED LAPAROSCOPIC OVARIAN CYSTECTOMY Left 04/13/2014   Procedure: ROBOTIC ASSISTED LAPAROSCOPIC Left OVARIAN CYSTECTOMY, BILATERAL SALPINGECTOMY,lysis of adhesions;  Surgeon: Princess Bruins, MD;  Location: [facility];  Service: Gynecology;  Laterality: Left;  . WISDOM TOOTH EXTRACTION      There were no vitals filed for this visit.   Subjective Assessment - 05/05/20 1255    Subjective "Today has been a really good day." Pt denies pain upon arrival today stating she has continued tenderness but feels good otherwise.    Pertinent History  see extensive list of pertinent PMH    Limitations Sitting;Lifting;Standing;Walking;House hold activities    How long can you sit comfortably? Needs to move at least once an hour to readjust/get comfortable    How long can you stand comfortably? 15 minutes at the most before feeling aching and numbness down left leg    How long can you walk comfortably? 5 minutes before feeling a "catching" in L anterior hip    Patient Stated Goals "just want to get back to normal household things", walking around stores, standing, sitting for work    Currently in Pain? No/denies    Pain Score 0-No pain    Pain Onset More than a month ago              [facility] PT Assessment - 05/05/20 0001      Assessment   Medical Diagnosis  Lumbago with sciatica, left side M54.42, Other chronic pain G89.29    Referring Provider (PT) Than, Elmyra Ricks, MD       Observation/Other Assessments   Focus on Therapeutic Outcomes (FOTO)  49% limitation; predicted 44% limitation      AROM   Lumbar Flexion 85 without pain    Lumbar Extension 30 with minimal pinching in lumbar spine    Lumbar - Right Side Bend To inferior pole of patella    Lumbar - Left Side Bend To inferior pole of  patella    Lumbar - Right Rotation [facility] without pain    Lumbar - Left Rotation WFL without pain      Strength   Right Hip Flexion 5/5    Right Hip ABduction 5/5    Right Hip ADduction 5/5    Left Hip Flexion 5/5    Left Hip ABduction 5/5    Left Hip ADduction 5/5                         [facility] Adult PT Treatment/Exercise - 05/05/20 0001      Self-Care   Self-Care Other Self-Care Comments    Other Self-Care Comments  Discussed and updated HEP for pt to continue with independence following D/C today. Informed pt that she can obtain new referral for PT if she has symptom aggravation and wishes to continue with skilled PT intervention in the future.      Lumbar Exercises: Seated   Other Seated Lumbar Exercises Hip hinge with  dowel x 15      Lumbar Exercises: Quadruped   Opposite Arm/Leg Raise Limitations --    Other Quadruped Lumbar Exercises --      Knee/Hip Exercises: Aerobic   Nustep L5 x 6 min      Knee/Hip Exercises: Standing   Hip Flexion Limitations Alternating marches 30x (15x each LE) on BOSU (blue)    Hip Abduction AROM;Stengthening;Both;20 reps;Knee straight    Abduction Limitations 2# ankle weight    Hip Extension AROM;Stengthening;20 reps;Knee straight;Both    Extension Limitations 2# ankle weight    Forward Step Up Left;20 reps;Step Height: 6"    Forward Step Up Limitations with RLE drive    SLS LLE SLS on BOSU (blue) then dynadisc attempted 5 x 10 sec with use of UE to maintain balance as needed    Other Standing Knee Exercises Monster walks and lateral stepping with green theraband 4 x 10 feet each      Knee/Hip Exercises: Prone   Other Prone Exercises Prone B hip ER with hip EXT x 15                  PT Education - 05/05/20 1256    Education Details Discussed and updated HEP for pt to continue with independence following D/C today. Informed pt that she can obtain new referral for PT if she has symptom aggravation and wishes to continue with skilled PT intervention in the future.    Person(s) Educated Patient    Methods Explanation;Demonstration    Comprehension Verbalized understanding;Returned demonstration            PT Short Term Goals - 05/05/20 0923      PT SHORT TERM GOAL #1   Title Patient will be independent with initial HEP.    Baseline Pt given HEP at initial evaluation 03/10/2020    Time 2    Period Weeks    Status Achieved    Target Date 03/24/20      PT SHORT TERM GOAL #2   Title Pt will be able to walk >10 minutes without significant increase in pain.    Baseline Pt has significant pain after 5 minutes of walking.    Time 2    Period Weeks    Status Achieved    Target Date 03/24/20      PT SHORT TERM GOAL #3   Title Pt will be able to stand for  30 minutes without significant increase in pain.  Baseline Pt has significant pain after 20 minutes of standing; Pt stood for 3 hours for her husband's birthday party but had significant pain afterwards. 05/03/2020: pt still has increased pain in L SI joint/low back with symptoms down LLE before 30 min when walking but can tell pain is less severe    Time 2    Period Weeks    Status Partially Met    Target Date 03/24/20      PT SHORT TERM GOAL #4   Title Patient will be able to perform > 30 degrees lumbar flexion in standing with < 3/10 pain in L groin.    Baseline Pt has ~5/10 pain with 30 degrees lumbar flexion in standing.    Time 2    Period Weeks    Status Achieved             PT Long Term Goals - 05/05/20 8032      PT LONG TERM GOAL #1   Title Patient will be independent with advanced HEP.    Baseline Patient currently received initial HEP on eval 03/10/2020. Update: continuing POC and will continue to add to HEP    Time 4    Period Weeks    Status Achieved      PT LONG TERM GOAL #2   Title Patient will improve FOTO score from 68% limited to 50% limited or better.    Baseline 68% limited; predicted 44% limited. Update 04/07/20: 60% limitation. 05/05/2020: 49% limitation    Time 4    Period Weeks    Status Achieved      PT LONG TERM GOAL #3   Title Patient will be able to sit for at least 2 hours without having significant increase in pain.    Baseline Patient currently has to readjust constantly and gets up at least once per hour when sitting. 05/03/20: pt still readjusts often while sitting    Time 4    Period Weeks    Status Not Met      PT LONG TERM GOAL #4   Title Patient will be able to perform community ambulation with normalized gait pattern for at least 1 hour without needing to take a seated rest break.    Baseline Pt currently is able to tolerate 5 minutes of walking before pain becomes severe. Pt demonstrates antalgic gait. 05/05/2020: needs to readjust but  can go without sitting until after running errands    Time 4    Period Weeks    Status Achieved                 Plan - 05/05/20 1257    Clinical Impression Statement Patient tolerated treatment session well and demonstrates improvement in mobility and strength compared to baseline. PT and pt discussed D/C today due to pt's progress and pt also having a busy month coming up with helping her daughter move and having several work deadlines towards the end of the year. She verbalizes compliance with HEP and should continue to progress with consistency of interventions at home. Patient's FOTO score improved to 49% limitation today.    Personal Factors and Comorbidities Comorbidity 1;Comorbidity 3+;Comorbidity 2    Comorbidities see extensive list of pertinent PMH    Examination-Activity Limitations Bend;Bed Mobility;Lift;Locomotion Level;Sit;Sleep;Squat;Stairs;Stand    Examination-Participation Restrictions Community Activity;Driving;Cleaning;Shop    Stability/Clinical Decision Making Evolving/Moderate complexity    Rehab Potential Good    PT Frequency 2x / week    PT Duration 4 weeks  PT Treatment/Interventions ADLs/Self Care Home Management;Electrical Stimulation;Moist Heat;Neuromuscular re-education;Balance training;Therapeutic exercise;Therapeutic activities;Functional mobility training;Stair training;Gait training;Patient/family education;Manual techniques;Dry needling;Passive range of motion;Scar mobilization;Taping;Vasopneumatic Device    PT Next Visit Plan Pt D/C this session. Review updated HEP, L sciatic nerve glides, progress/continue balance on dynadisc and/or BOSU, swiss ball marches, squats on BOSU, leg press, piriformis stretch    PT Home Exercise Plan [id number] - piriformis stretch, bridges, sidelying hip ABD, modified Thomas stretch, supine ITB stretch, seated HS stretch, standing hip EXT, clamshell with green theraband, supine dead bug, hip hinge (standing and seated), L  sciatic nerve glides (seated and/or supine); prone hip ER with hip EXT (R)    Consulted and Agree with Plan of Care Patient           Patient will benefit from skilled therapeutic intervention in order to improve the following deficits and impairments:  Abnormal gait,Decreased activity tolerance,Decreased mobility,Decreased strength,Decreased scar mobility,Decreased endurance,Decreased range of motion,Difficulty walking  Visit Diagnosis: Difficulty in walking, not elsewhere classified  Pain in left hip  Pain in left leg  Pain in left lower leg  Acute left-sided low back pain, unspecified whether sciatica present     Problem List Patient Active Problem List   Diagnosis Date Noted  . Ovarian cyst 04/13/2014  . Appendicitis, acute 02/11/2014    PHYSICAL THERAPY DISCHARGE SUMMARY  Visits from Start of Care: 9  Current functional level related to goals / functional outcomes: See above   Remaining deficits: See above   Education / Equipment: See above Plan: Patient agrees to discharge.  Patient goals were partially met. Patient is being discharged due to being pleased with the current functional level.  ?????       Haydee Monica, PT, DPT 05/05/20 1:24 PM   [facility] [address] [place], [place], [address] Phone: [phone]   Fax:  [phone]  Name: Lisa Novak MRN: [id number] Date of Birth: 1970/11/29

## 2020-10-19 ENCOUNTER — Ambulatory Visit: Payer: Commercial Managed Care - PPO

## 2020-12-06 ENCOUNTER — Other Ambulatory Visit: Payer: Self-pay | Admitting: Internal Medicine

## 2020-12-09 ENCOUNTER — Other Ambulatory Visit: Payer: Self-pay | Admitting: Obstetrics and Gynecology

## 2020-12-09 DIAGNOSIS — Z1231 Encounter for screening mammogram for malignant neoplasm of breast: Secondary | ICD-10-CM

## 2020-12-12 ENCOUNTER — Other Ambulatory Visit: Payer: Self-pay

## 2020-12-12 ENCOUNTER — Ambulatory Visit
Admission: RE | Admit: 2020-12-12 | Discharge: 2020-12-12 | Disposition: A | Discharge status: Home / Self Care | Payer: Commercial Managed Care - PPO | Source: Ambulatory Visit | Attending: Obstetrics and Gynecology | Admitting: Obstetrics and Gynecology

## 2020-12-12 DIAGNOSIS — Z1231 Encounter for screening mammogram for malignant neoplasm of breast: Secondary | ICD-10-CM

## 2020-12-12 IMAGING — MG MM DIGITAL SCREENING BILAT W/ TOMO AND CAD
8 series · 8 of 24 positions shown · non-contrast
Comparison: None.

CLINICAL DATA: Screening.

EXAM:
DIGITAL SCREENING BILATERAL MAMMOGRAM WITH TOMOSYNTHESIS AND CAD
TECHNIQUE: Bilateral screening digital craniocaudal and mediolateral oblique
mammograms were obtained. Bilateral screening digital breast
tomosynthesis was performed. The images were evaluated with
computer-aided detection.

[L MLO synth-2D]
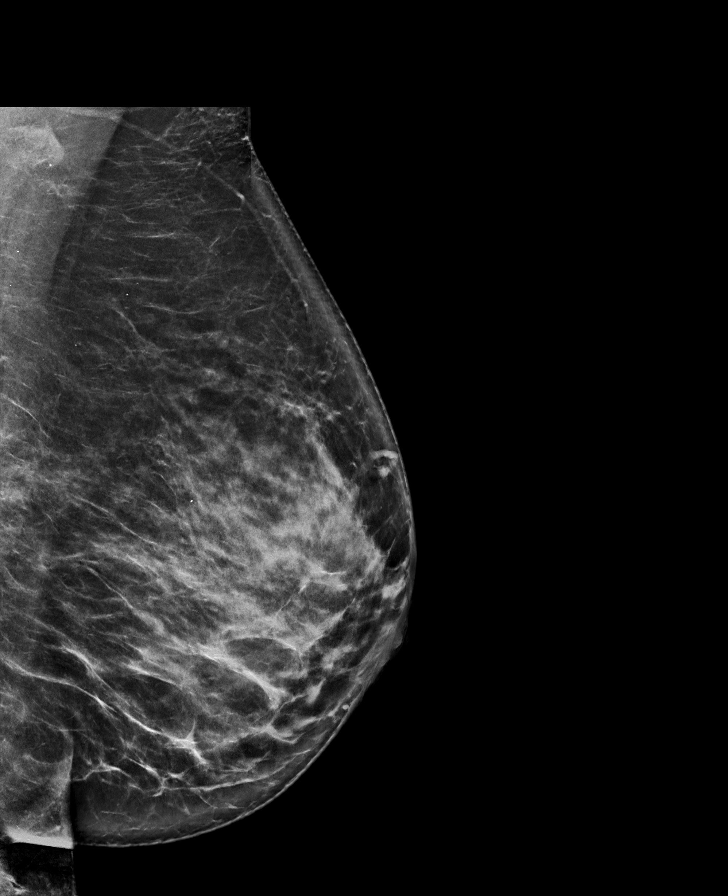

[R MLO synth-2D]
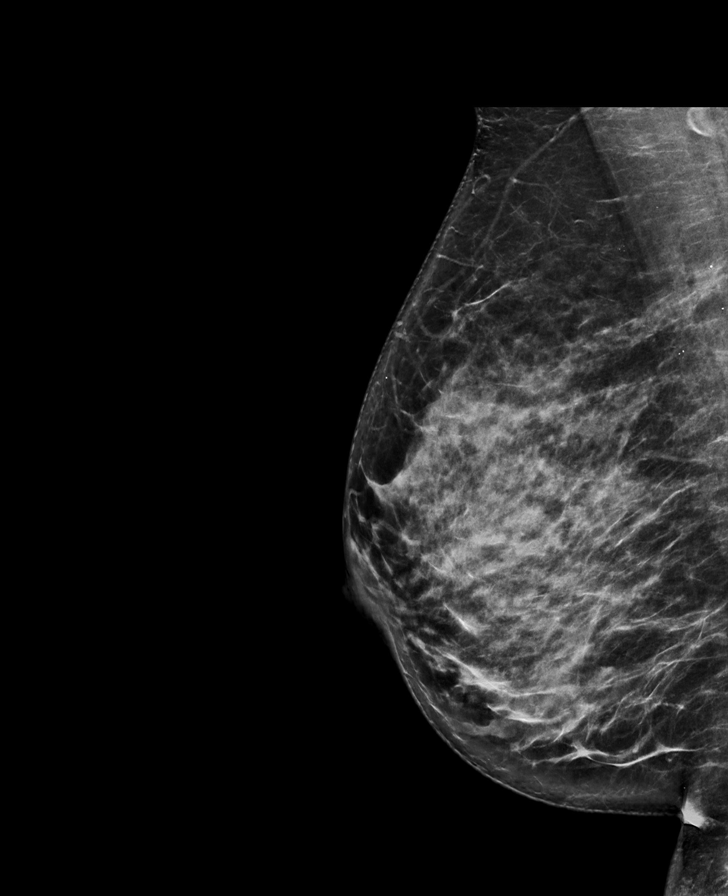

[L CC synth-2D]
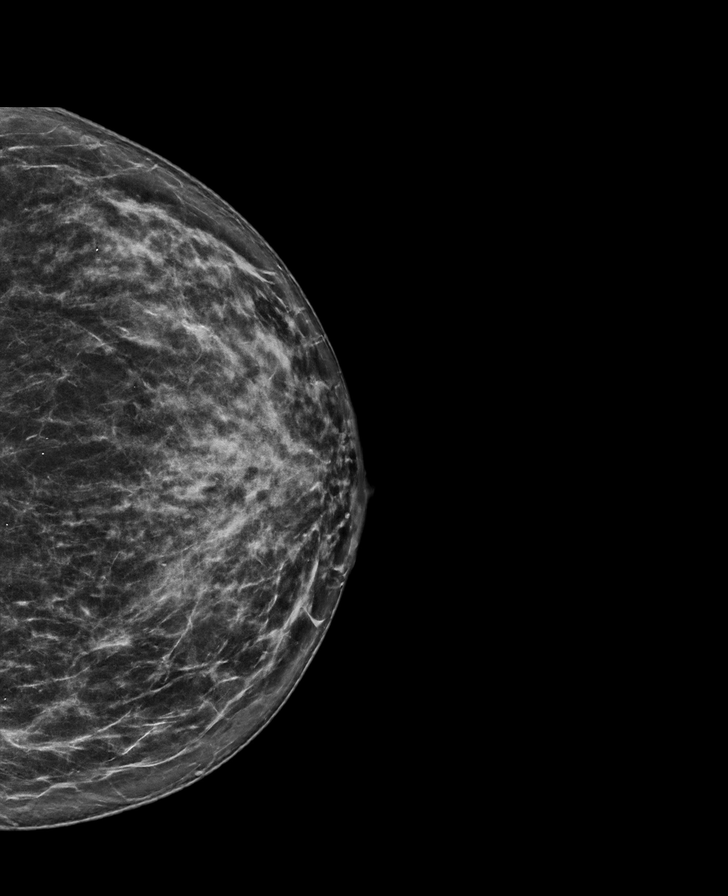

[R CC synth-2D]
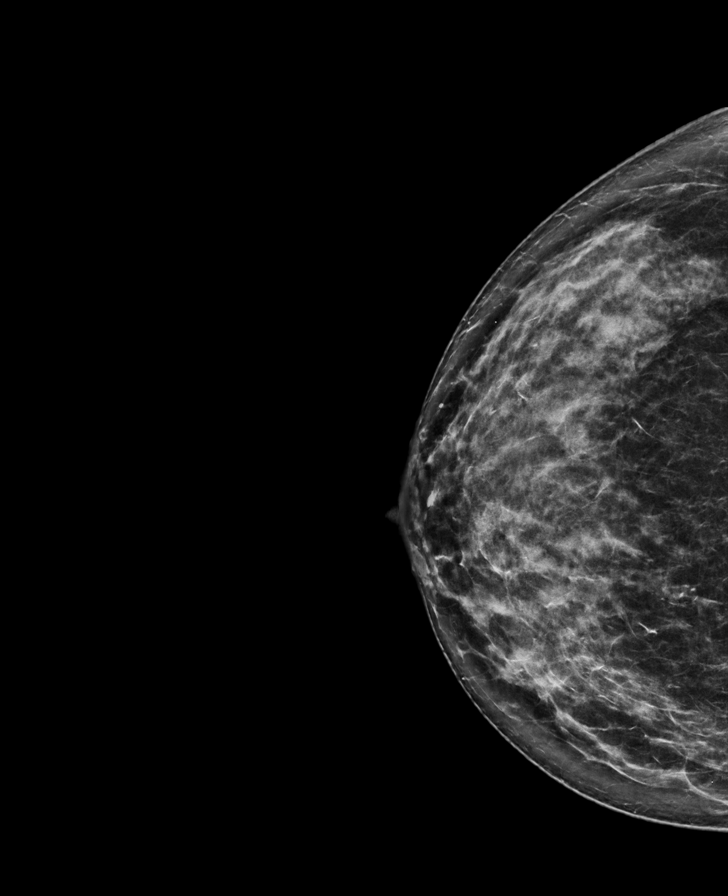

[L MLO tomo · tomo slice 47/92.0]
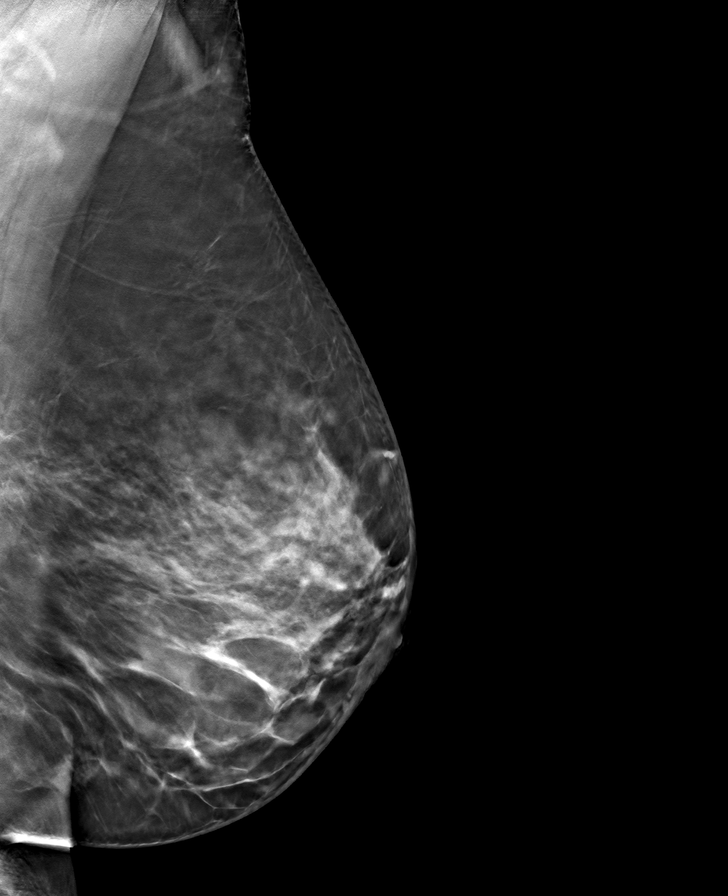

[L CC tomo · tomo slice 39/78.0]
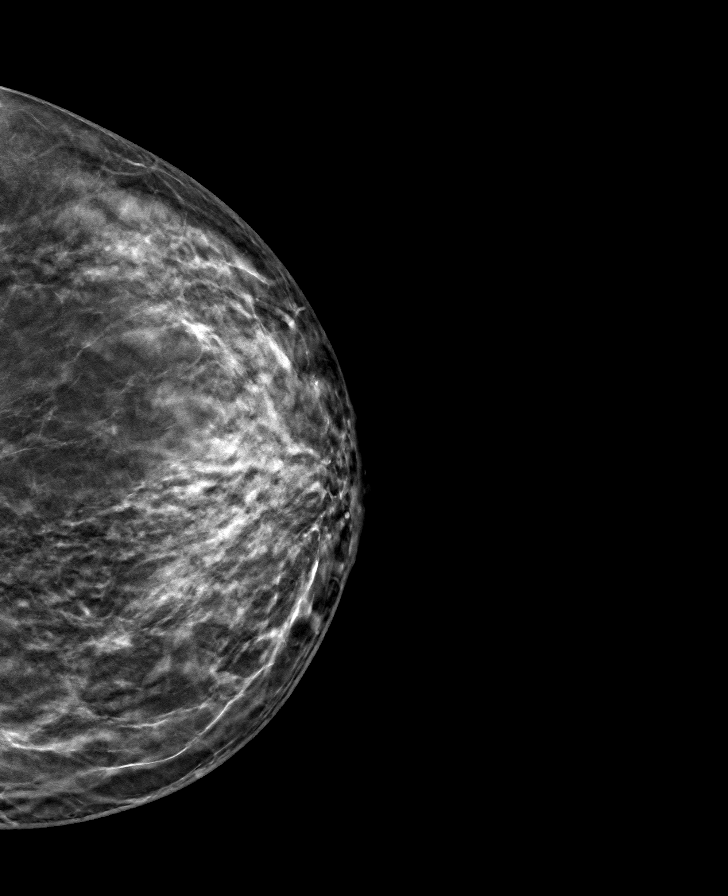

[R CC tomo · tomo slice 44/87.0]
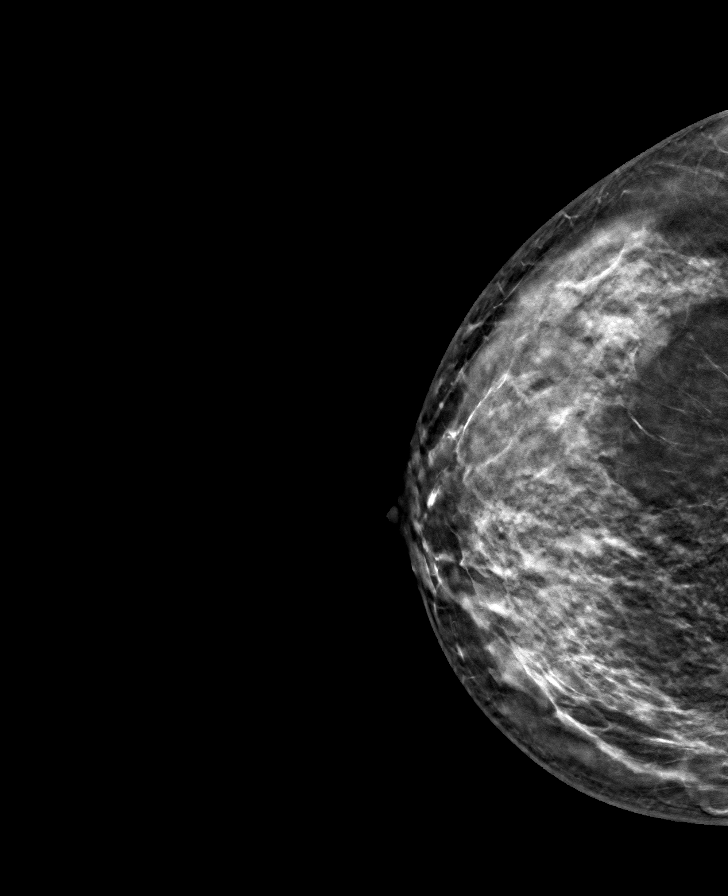

[R MLO tomo · tomo slice 43/84.0]
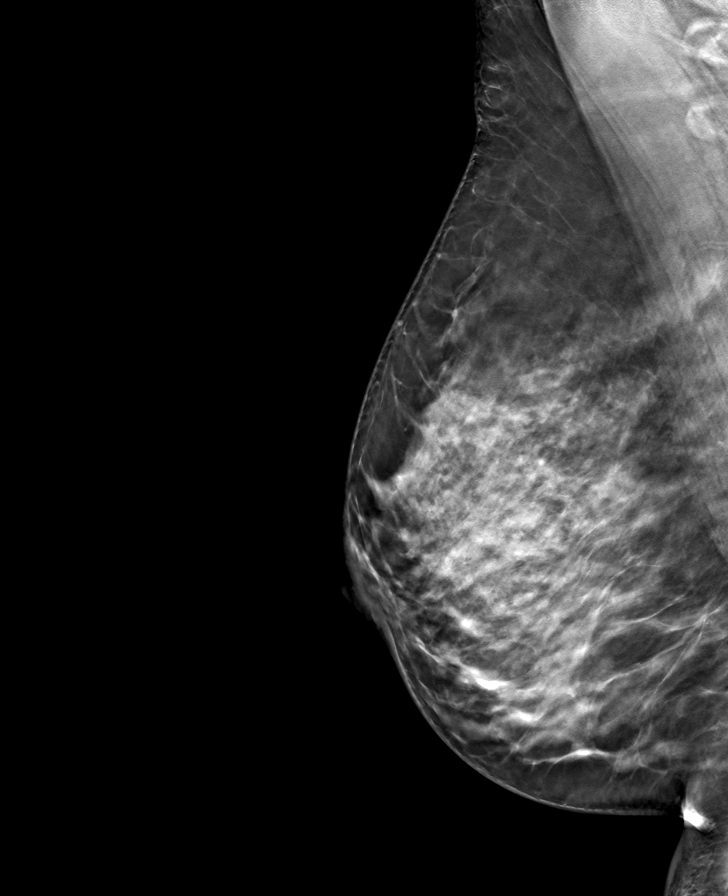

[8 of 24 positions shown; findings below may reference images not displayed]

ACR Breast Density Category d: The breast tissue is extremely dense,
which lowers the sensitivity of mammography.
FINDINGS: There are no findings suspicious for malignancy.
IMPRESSION: No mammographic evidence of malignancy. A result letter of this
screening mammogram will be mailed directly to the patient.

RECOMMENDATION:
Screening mammogram in one year. (Code:W3-Z-XIN)

BI-RADS CATEGORY  1: Negative.

## 2021-01-26 ENCOUNTER — Ambulatory Visit: Payer: Commercial Managed Care - PPO | Admitting: Internal Medicine

## 2021-07-26 ENCOUNTER — Other Ambulatory Visit (HOSPITAL_COMMUNITY): Payer: Self-pay | Admitting: Orthopedic Surgery

## 2021-07-26 ENCOUNTER — Ambulatory Visit
Admission: RE | Admit: 2021-07-26 | Discharge: 2021-07-26 | Disposition: A | Discharge status: Home / Self Care | Payer: Commercial Managed Care - PPO | Source: Ambulatory Visit | Attending: Orthopedic Surgery | Admitting: Orthopedic Surgery

## 2021-07-26 DIAGNOSIS — M48061 Spinal stenosis, lumbar region without neurogenic claudication: Secondary | ICD-10-CM

## 2021-07-26 IMAGING — CR DG LUMBAR SPINE 2-3V
3 series · 3 of 3 positions shown · non-contrast
Comparison: 06/06/2016

CLINICAL DATA: Spinal stenosis.  Low back pain

EXAM:
LUMBAR SPINE - 2-3 VIEW

[w l-spine lat * (1 of 2)]
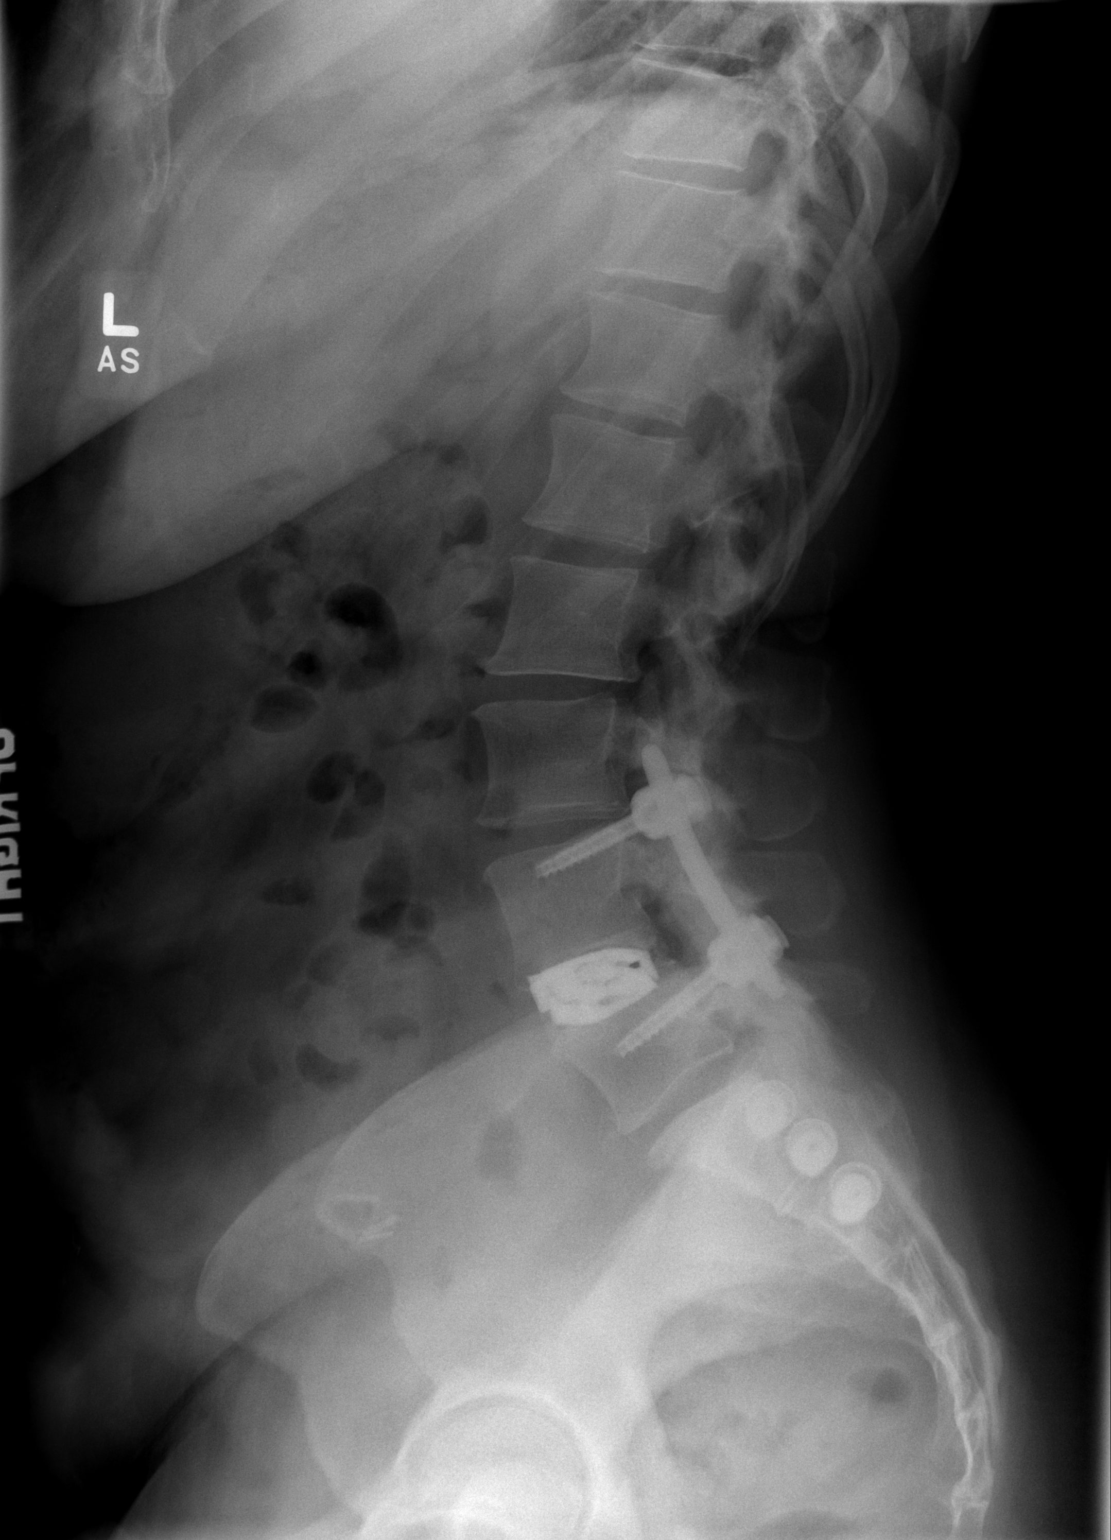

[w l-spine a.p. *]
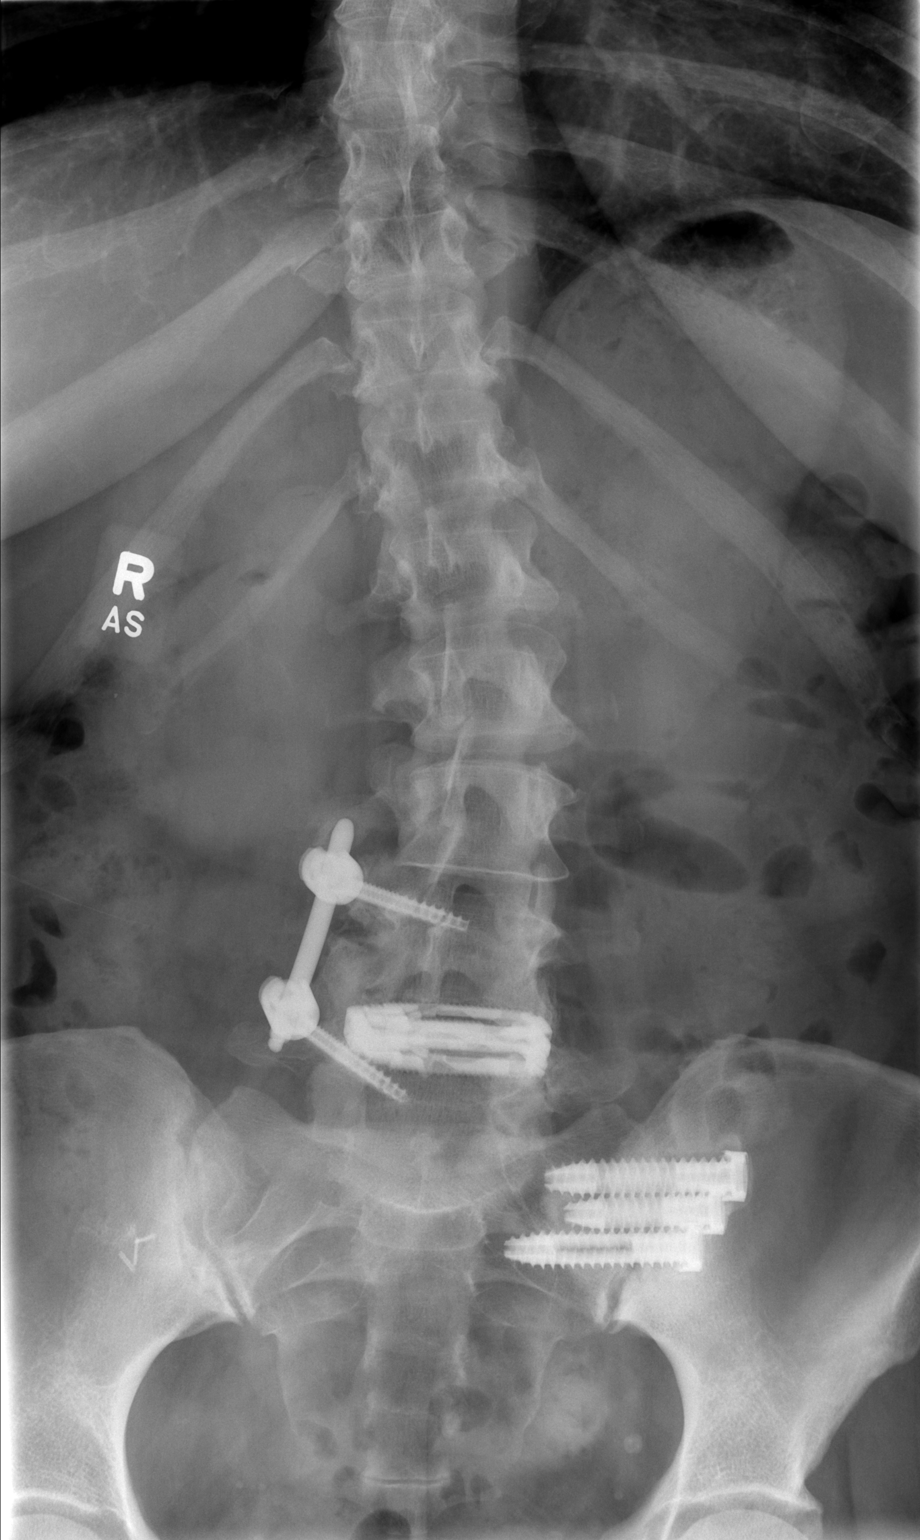

[w l-spine lat * (2 of 2)]
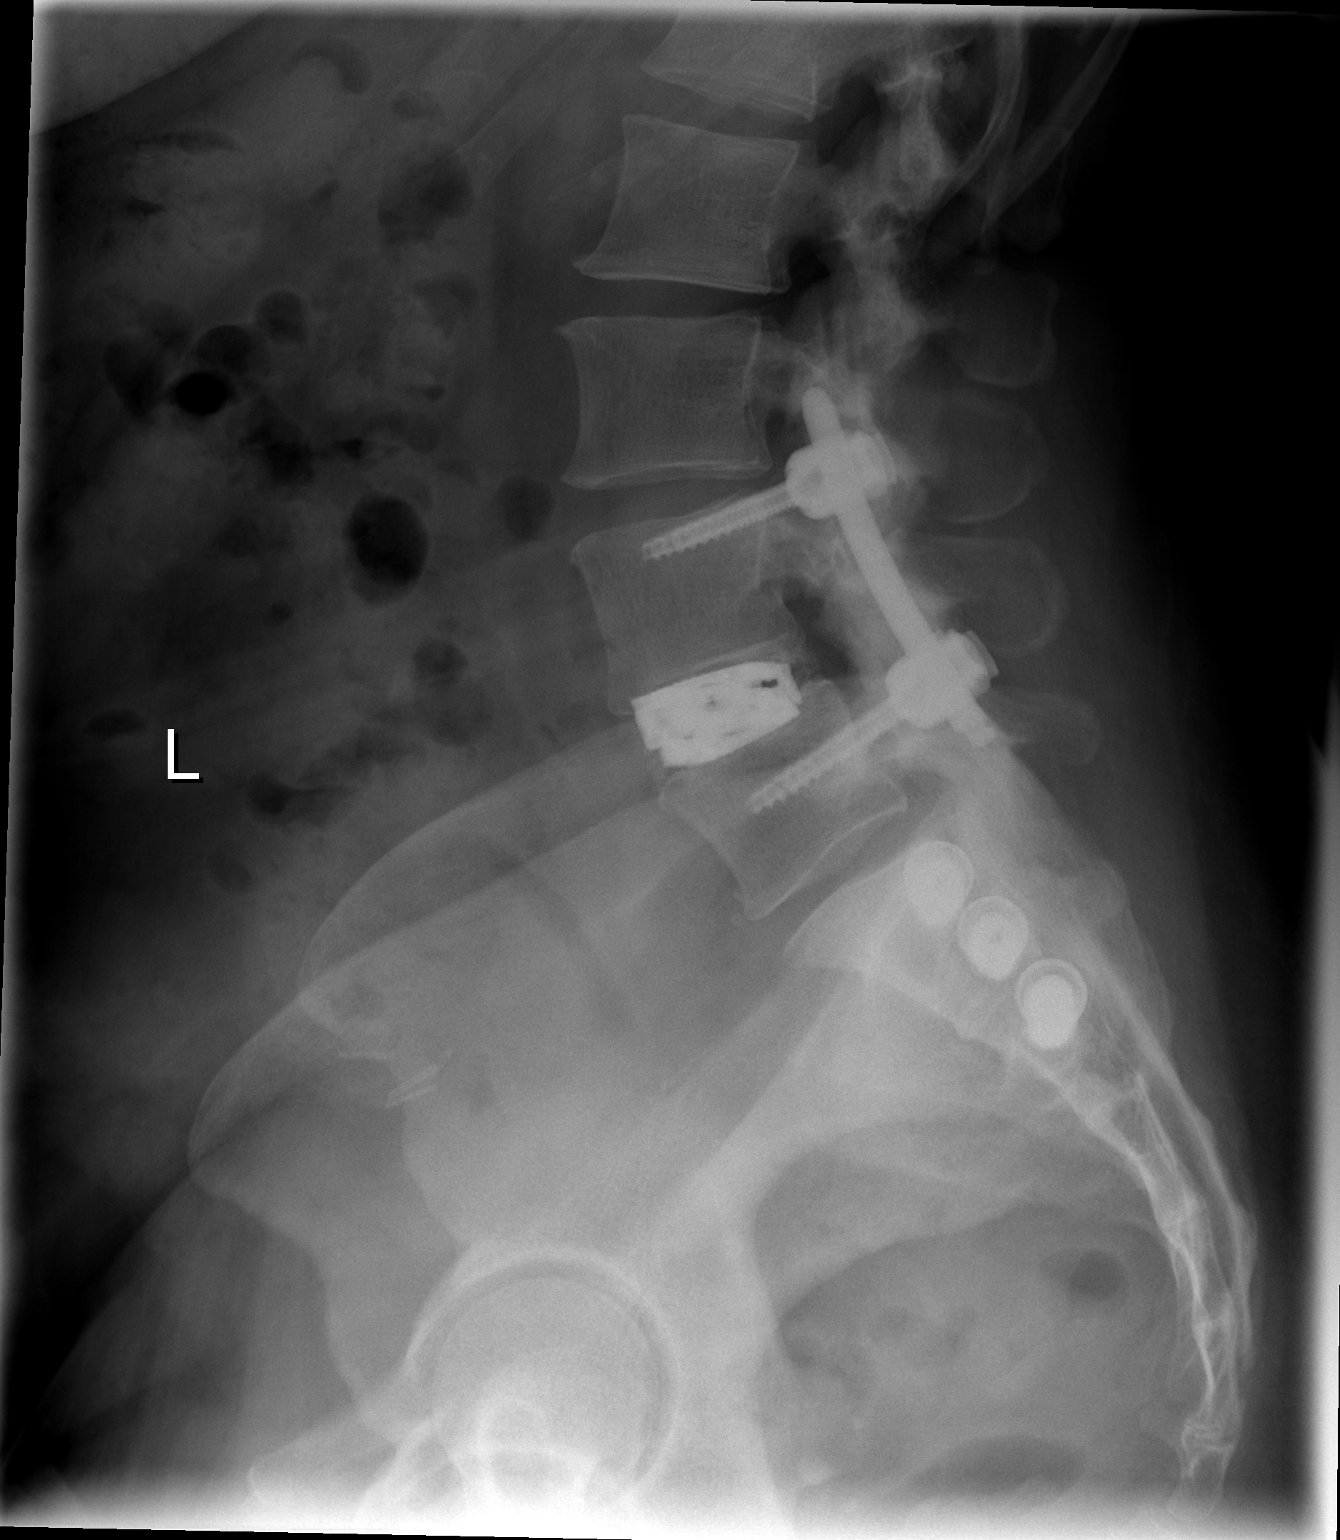

[3 of 3 positions shown; findings below may reference images not displayed]

FINDINGS: L4-5 TLIF and 3 sacroiliac cannulated screws on the left. Mild
lumbar levoscoliosis. AP alignment is normal. There are 5 lumbar
vertebral bodies. The vertebral body heights are maintained. No
abnormal lucency around any of the hardware.
IMPRESSION: L4-5 TLIF and left sacroiliac fusion without adverse features.

## 2021-08-02 ENCOUNTER — Other Ambulatory Visit (HOSPITAL_COMMUNITY): Payer: Self-pay | Admitting: Orthopedic Surgery

## 2021-08-02 ENCOUNTER — Other Ambulatory Visit: Payer: Self-pay | Admitting: Orthopedic Surgery

## 2021-08-02 DIAGNOSIS — M5416 Radiculopathy, lumbar region: Secondary | ICD-10-CM

## 2021-08-02 DIAGNOSIS — M48061 Spinal stenosis, lumbar region without neurogenic claudication: Secondary | ICD-10-CM

## 2021-08-10 ENCOUNTER — Ambulatory Visit (HOSPITAL_COMMUNITY)
Admission: RE | Admit: 2021-08-10 | Discharge: 2021-08-10 | Disposition: A | Discharge status: Home / Self Care | Payer: Commercial Managed Care - PPO | Source: Ambulatory Visit | Attending: Orthopedic Surgery | Admitting: Orthopedic Surgery

## 2021-08-10 ENCOUNTER — Other Ambulatory Visit: Payer: Self-pay

## 2021-08-10 DIAGNOSIS — M48061 Spinal stenosis, lumbar region without neurogenic claudication: Secondary | ICD-10-CM | POA: Insufficient documentation

## 2021-08-10 DIAGNOSIS — M5416 Radiculopathy, lumbar region: Secondary | ICD-10-CM | POA: Insufficient documentation

## 2021-08-10 IMAGING — MR MR LUMBAR SPINE W/O CM
4 of 5 series · 18 of 48 positions shown · non-contrast
Comparison: MR lumbar 07/31/2015; X -ray lumbar 07/26/2021; CT
lumbar 07/06/2016.

CLINICAL DATA: Lumbar radiculopathy; foraminal stenosis of lumbar
region.

EXAM:
MRI LUMBAR SPINE WITHOUT CONTRAST
TECHNIQUE: Multiplanar, multisequence MR imaging of the lumbar spine was
performed. No intravenous contrast was administered.

[Series 3: T2 · sagittal · 4.0mm · 0.55mm/px · 6 of 16 slices shown (1 of 2)]
[im 1/16]
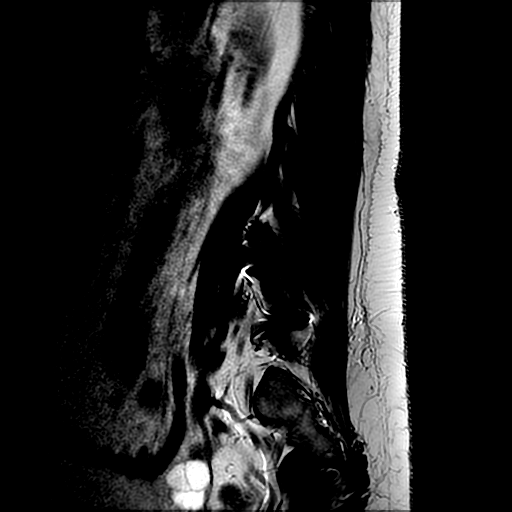
[im 4/16]
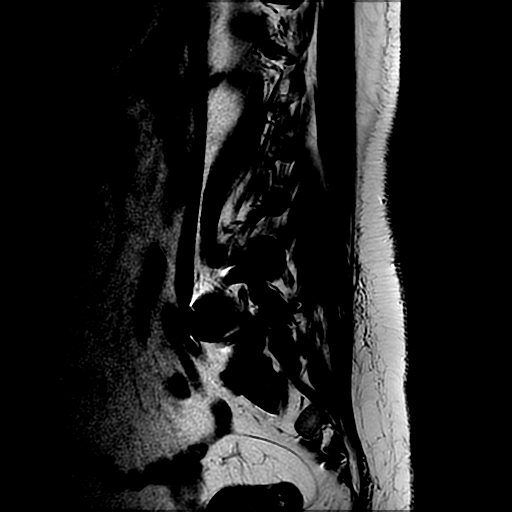
[im 7/16]
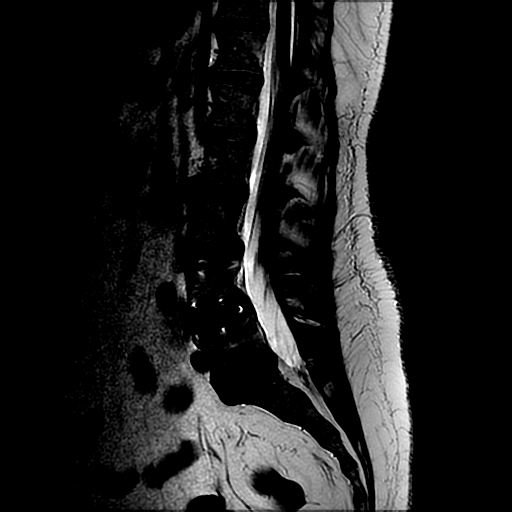
[im 10/16]
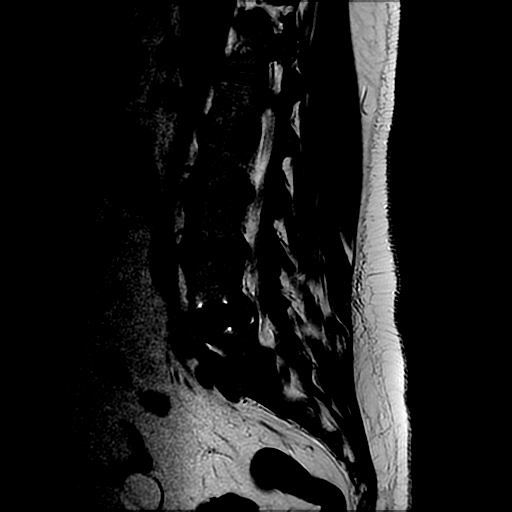
[im 13/16]
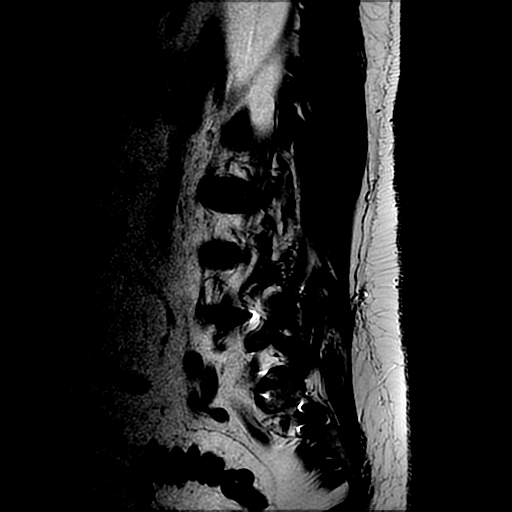
[im 16/16]
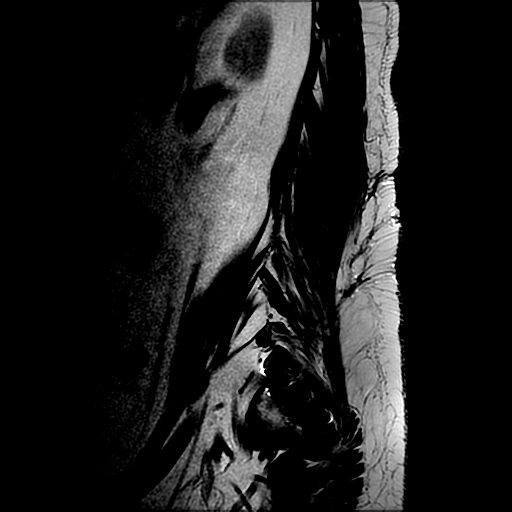

[Series 4: T1 · sagittal · 4.0mm · 0.55mm/px · 3 of 16 slices shown (1 of 2)]
[im 4/16]
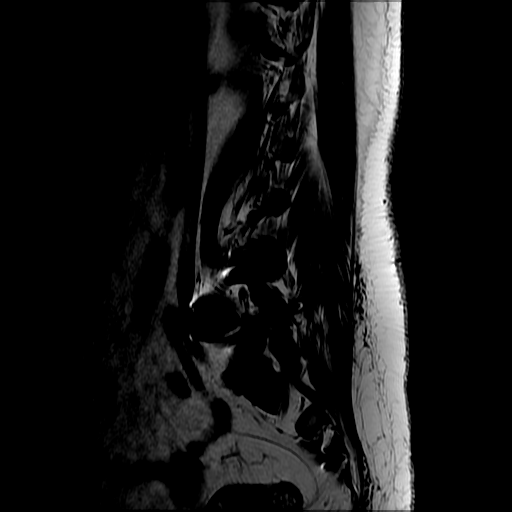
[im 10/16]
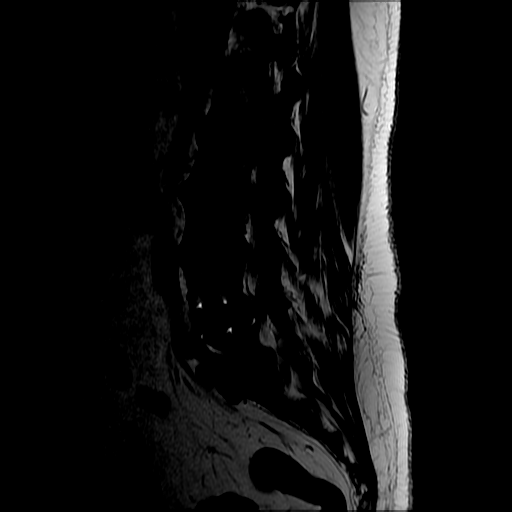
[im 16/16]
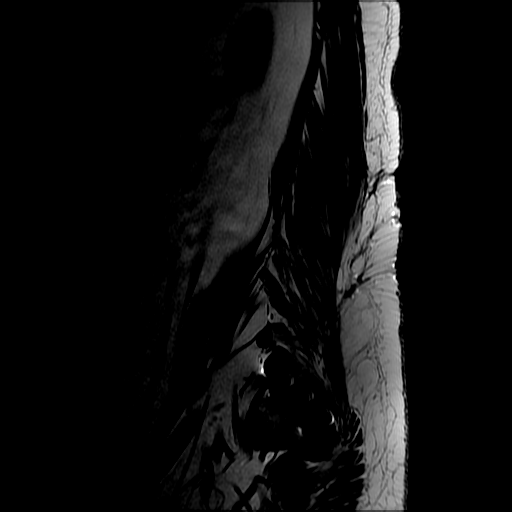

[Series 6: T2 · axial · 4.0mm · 0.39mm/px · z∈[-25,+160]mm · 6 of 40 slices shown (2 of 2)]
[im 1/40]
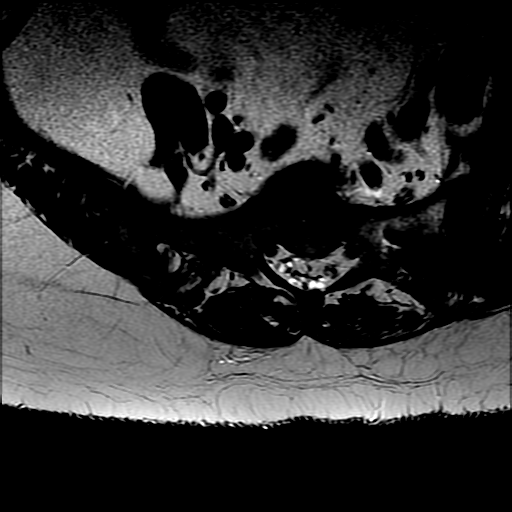
[im 6/40]
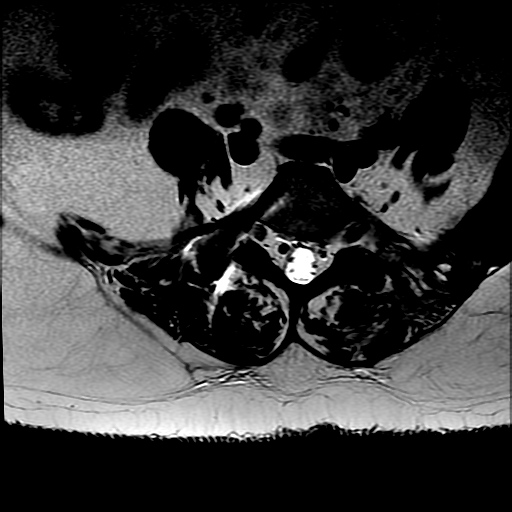
[im 12/40]
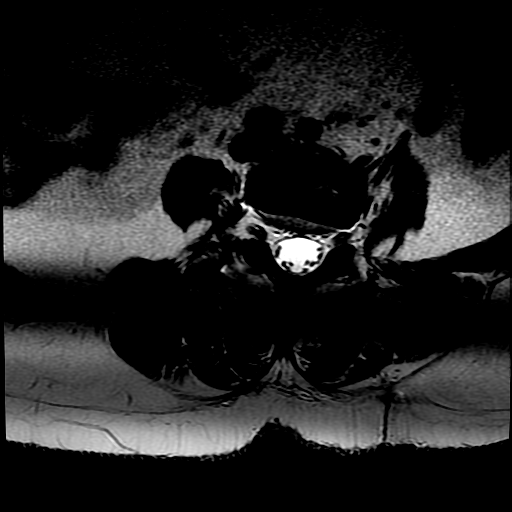
[im 17/40]
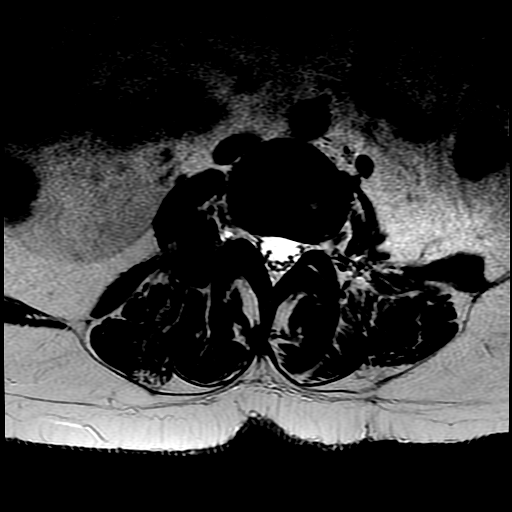
[im 20/40]
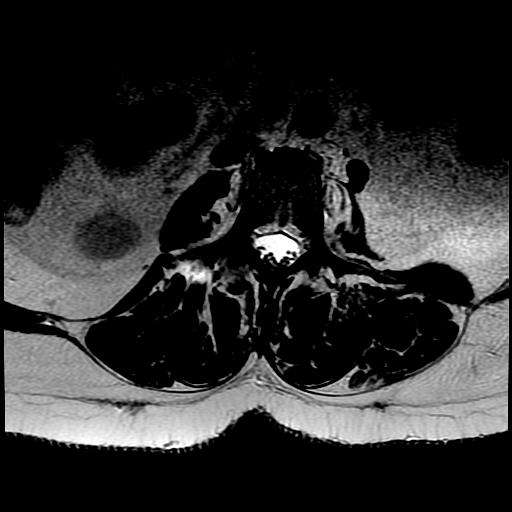
[im 34/40]
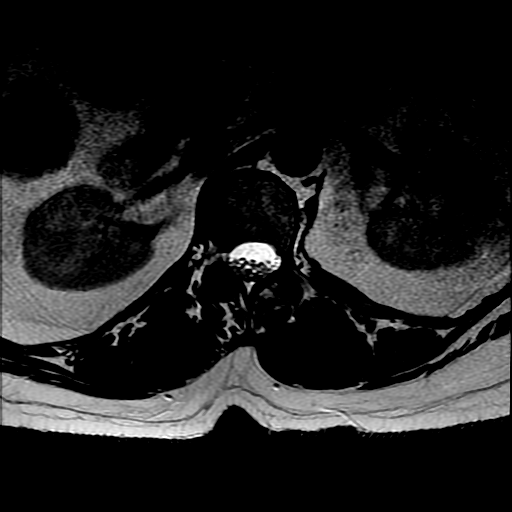

[Series 7: T1 · axial · 4.0mm · 0.39mm/px · z∈[-1,+160]mm · 3 of 40 slices shown (2 of 2)]
[im 6/40]
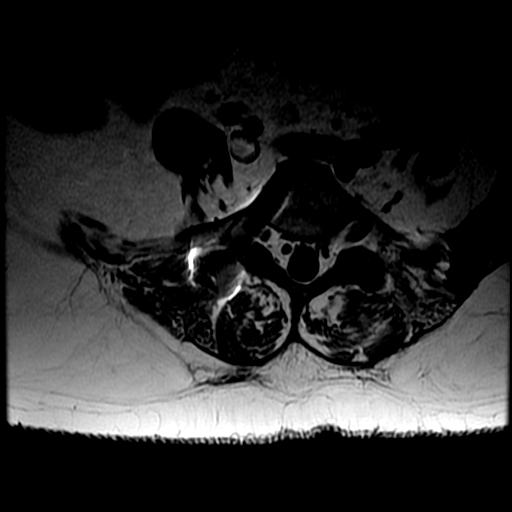
[im 20/40]
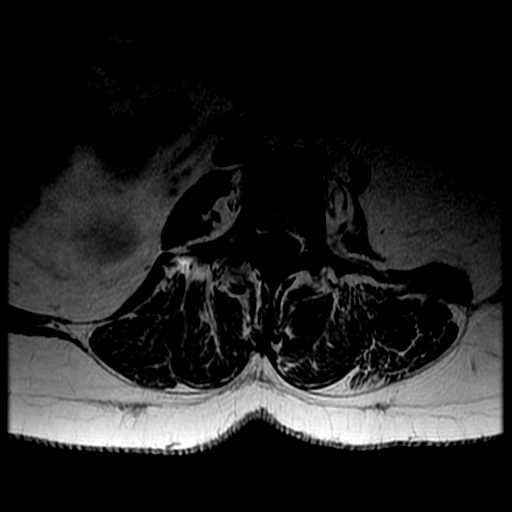
[im 34/40]
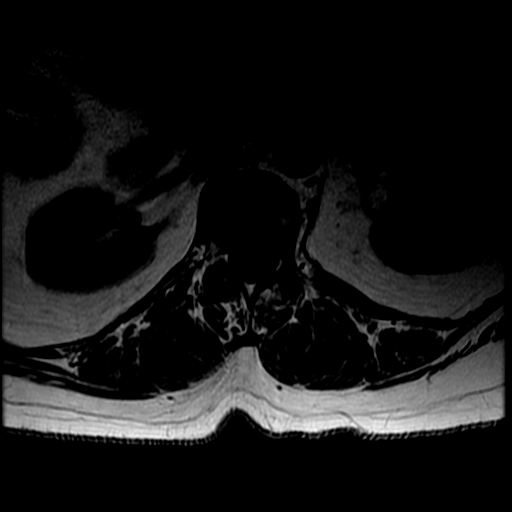

[18 of 48 positions shown; findings below may reference images not displayed]

FINDINGS: Segmentation:  Standard.

Alignment:  Physiologic.

Vertebrae: No acute fracture, evidence of discitis, or aggressive
bone lesion.

Conus medullaris and cauda equina: Conus extends to the T12 level.
Conus and cauda equina appear normal.

Paraspinal and other soft tissues: No acute paraspinal abnormality.

Disc levels:

Disc spaces: Posterior lumbar interbody fusion at L4-5. Disc
desiccation at L2-3, L3-4 and L5-S1 with minimal disc height loss.

T12-L1: No significant disc bulge. No neural foraminal stenosis. No
central canal stenosis.

L1-L2: No significant disc bulge. No neural foraminal stenosis. No
central canal stenosis.

L2-L3: Mild broad-based disc bulge with a left foraminal disc
protrusion. Mild bilateral facet arthropathy. No spinal stenosis.
Mild left foraminal stenosis. No right foraminal stenosis.

L3-L4: Broad-based disc bulge with a left foraminal annular fissure.
No foraminal or central canal stenosis. Mild bilateral facet
arthropathy.

L4-L5: Interbody fusion. No foraminal or central canal stenosis.
Mild bilateral facet arthropathy.

L5-S1: Mild broad-based disc bulge. Mild bilateral facet
arthropathy. No foraminal or central canal stenosis.
IMPRESSION: 1. Posterior lumbar interbody fusion at L4-5. No foraminal or
central canal stenosis.
2. At L2-3 there is a mild broad-based disc bulge with a left
foraminal disc protrusion. Mild bilateral facet arthropathy. No
spinal stenosis. Mild left foraminal stenosis. No right foraminal
stenosis.
3. At L3-4 there is a broad-based disc bulge with a left foraminal
annular fissure.
4. At L5-S1 there is a mild broad-based disc bulge. Mild bilateral
facet arthropathy.

## 2021-08-12 ENCOUNTER — Ambulatory Visit (HOSPITAL_COMMUNITY): Payer: Commercial Managed Care - PPO

## 2022-05-04 ENCOUNTER — Other Ambulatory Visit: Payer: Self-pay | Admitting: Obstetrics and Gynecology

## 2022-05-04 ENCOUNTER — Ambulatory Visit
Admission: RE | Admit: 2022-05-04 | Discharge: 2022-05-04 | Disposition: A | Discharge status: Home / Self Care | Payer: Commercial Managed Care - PPO | Source: Ambulatory Visit | Attending: Obstetrics and Gynecology | Admitting: Obstetrics and Gynecology

## 2022-05-04 DIAGNOSIS — Z1231 Encounter for screening mammogram for malignant neoplasm of breast: Secondary | ICD-10-CM

## 2023-05-15 NOTE — Progress Notes (Signed)
Surgical Instructions   Your procedure is scheduled on Thursday May 30, 2023. Report to [facility] Main Entrance "A" at 5:30 A.M., then check in with the Admitting office. Any questions or running late day of surgery: call [phone]  Questions prior to your surgery date: call [phone], Monday-Friday, 8am-4pm. If you experience any cold or flu symptoms such as cough, fever, chills, shortness of breath, etc. between now and your scheduled surgery, please notify us at the above number.     Remember:  Do not eat after midnight the night before your surgery  You may drink clear liquids until 4:30 the morning of your surgery.   Clear liquids allowed are: Water, Non-Citrus Juices (without pulp), Carbonated Beverages, Clear Tea (no milk, honey, etc.), Black Coffee Only (NO MILK, CREAM OR POWDERED CREAMER of any kind), and Gatorade.    Take these medicines the morning of surgery with A SIP OF WATER  DULoxetine (CYMBALTA)  gabapentin (NEURONTIN)  omeprazole (PRILOSEC)   May take these medicines IF NEEDED: HYDROcodone-acetaminophen (NORCO)  ondansetron (ZOFRAN-ODT)   One week prior to surgery, STOP taking any Aspirin (unless otherwise instructed by your surgeon) Aleve, Naproxen, Ibuprofen, Motrin, Advil, Goody's, BC's, all herbal medications, fish oil, and non-prescription vitamins.   WHAT DO I DO ABOUT MY DIABETES MEDICATION?   Do not take oral diabetes medicines (pills) the morning of surgery.        DO NOT TAKE YOUR OZEMPIC 7 DAYS PRIOR TO SURGERY, WITH THE LAST DOSE BEING NO LATER THAN 05/22/2023.    The day of surgery, do not take other diabetes injectables, including Byetta (exenatide), Bydureon (exenatide ER), Victoza (liraglutide), or Trulicity (dulaglutide).  If your CBG is greater than 220 mg/dL, you may take  of your sliding scale (correction) dose of insulin.   HOW TO MANAGE YOUR DIABETES BEFORE AND AFTER SURGERY  Why is it important to control my blood sugar  before and after surgery? Improving blood sugar levels before and after surgery helps healing and can limit problems. A way of improving blood sugar control is eating a healthy diet by:  Eating less sugar and carbohydrates  Increasing activity/exercise  Talking with your doctor about reaching your blood sugar goals High blood sugars (greater than 180 mg/dL) can raise your risk of infections and slow your recovery, so you will need to focus on controlling your diabetes during the weeks before surgery. Make sure that the doctor who takes care of your diabetes knows about your planned surgery including the date and location.  How do I manage my blood sugar before surgery? Check your blood sugar at least 4 times a day, starting 2 days before surgery, to make sure that the level is not too high or low.  Check your blood sugar the morning of your surgery when you wake up and every 2 hours until you get to the Short Stay unit.  If your blood sugar is less than 70 mg/dL, you will need to treat for low blood sugar: Do not take insulin. Treat a low blood sugar (less than 70 mg/dL) with  cup of clear juice (cranberry or apple), 4 glucose tablets, OR glucose gel. Recheck blood sugar in 15 minutes after treatment (to make sure it is greater than 70 mg/dL). If your blood sugar is not greater than 70 mg/dL on recheck, call [phone] for further instructions. Report your blood sugar to the short stay nurse when you get to Short Stay.  If you are admitted to the hospital after  surgery: Your blood sugar will be checked by the staff and you will probably be given insulin after surgery (instead of oral diabetes medicines) to make sure you have good blood sugar levels. The goal for blood sugar control after surgery is 80-180 mg/dL.                      Do NOT Smoke (Tobacco/Vaping) for 24 hours prior to your procedure.  If you use a CPAP at night, you may bring your mask/headgear for your overnight  stay.   You will be asked to remove any contacts, glasses, piercing's, hearing aid's, dentures/partials prior to surgery. Please bring cases for these items if needed.    Patients discharged the day of surgery will not be allowed to drive home, and someone needs to stay with them for 24 hours.  SURGICAL WAITING ROOM VISITATION Patients may have no more than 2 support people in the waiting area - these visitors may rotate.   Pre-op nurse will coordinate an appropriate time for 1 ADULT support person, who may not rotate, to accompany patient in pre-op.  Children under the age of 50 must have an adult with them who is not the patient and must remain in the main waiting area with an adult.  If the patient needs to stay at the hospital during part of their recovery, the visitor guidelines for inpatient rooms apply.  Please refer to the [facility] website for the visitor guidelines for any additional information.   If you received a COVID test during your pre-op visit  it is requested that you wear a mask when out in public, stay away from anyone that may not be feeling well and notify your surgeon if you develop symptoms. If you have been in contact with anyone that has tested positive in the last 10 days please notify you surgeon.      Pre-operative 5 CHG Bathing Instructions   You can play a key role in reducing the risk of infection after surgery. Your skin needs to be as free of germs as possible. You can reduce the number of germs on your skin by washing with CHG (chlorhexidine gluconate) soap before surgery. CHG is an antiseptic soap that kills germs and continues to kill germs even after washing.   DO NOT use if you have an allergy to chlorhexidine/CHG or antibacterial soaps. If your skin becomes reddened or irritated, stop using the CHG and notify one of our RNs at [phone].   Please shower with the CHG soap starting 4 days before surgery using the following schedule:      Please keep in mind the following:  DO NOT shave, including legs and underarms, starting the day of your first shower.   You may shave your face at any point before/day of surgery.  Place clean sheets on your bed the day you start using CHG soap. Use a clean washcloth (not used since being washed) for each shower. DO NOT sleep with pets once you start using the CHG.   CHG Shower Instructions:  Wash your face and private area with normal soap. If you choose to wash your hair, wash first with your normal shampoo.  After you use shampoo/soap, rinse your hair and body thoroughly to remove shampoo/soap residue.  Turn the water OFF and apply about 3 tablespoons (45 ml) of CHG soap to a CLEAN washcloth.  Apply CHG soap ONLY FROM YOUR NECK DOWN TO YOUR TOES (washing for 3-5 minutes)  DO NOT use CHG soap on face, private areas, open wounds, or sores.  Pay special attention to the area where your surgery is being performed.  If you are having back surgery, having someone wash your back for you may be helpful. Wait 2 minutes after CHG soap is applied, then you may rinse off the CHG soap.  Pat dry with a clean towel  Put on clean clothes/pajamas   If you choose to wear lotion, please use ONLY the CHG-compatible lotions on the back of this paper.   Additional instructions for the day of surgery: DO NOT APPLY any lotions, deodorants or perfumes.   Do not bring valuables to the hospital. [facility] is not responsible for any belongings/valuables. Do not wear nail polish, gel polish, artificial nails, or any other type of covering on natural nails (fingers and toes) Do not wear jewelry or makeup Put on clean/comfortable clothes.  Please brush your teeth.  Ask your nurse before applying any prescription medications to the skin.     CHG Compatible Lotions   Aveeno Moisturizing lotion  Cetaphil Moisturizing Cream  Cetaphil Moisturizing Lotion  Clairol Herbal Essence Moisturizing Lotion,  Dry Skin  Clairol Herbal Essence Moisturizing Lotion, Extra Dry Skin  Clairol Herbal Essence Moisturizing Lotion, Normal Skin  Curel Age Defying Therapeutic Moisturizing Lotion with Alpha Hydroxy  Curel Extreme Care Body Lotion  Curel Soothing Hands Moisturizing Hand Lotion  Curel Therapeutic Moisturizing Cream, Fragrance-Free  Curel Therapeutic Moisturizing Lotion, Fragrance-Free  Curel Therapeutic Moisturizing Lotion, Original Formula  Eucerin Daily Replenishing Lotion  Eucerin Dry Skin Therapy Plus Alpha Hydroxy Crme  Eucerin Dry Skin Therapy Plus Alpha Hydroxy Lotion  Eucerin Original Crme  Eucerin Original Lotion  Eucerin Plus Crme Eucerin Plus Lotion  Eucerin TriLipid Replenishing Lotion  Keri Anti-Bacterial Hand Lotion  Keri Deep Conditioning Original Lotion Dry Skin Formula Softly Scented  Keri Deep Conditioning Original Lotion, Fragrance Free Sensitive Skin Formula  Keri Lotion Fast Absorbing Fragrance Free Sensitive Skin Formula  Keri Lotion Fast Absorbing Softly Scented Dry Skin Formula  Keri Original Lotion  Keri Skin Renewal Lotion Keri Silky Smooth Lotion  Keri Silky Smooth Sensitive Skin Lotion  Nivea Body Creamy Conditioning Oil  Nivea Body Extra Enriched Lotion  Nivea Body Original Lotion  Nivea Body Sheer Moisturizing Lotion Nivea Crme  Nivea Skin Firming Lotion  NutraDerm 30 Skin Lotion  NutraDerm Skin Lotion  NutraDerm Therapeutic Skin Cream  NutraDerm Therapeutic Skin Lotion  ProShield Protective Hand Cream  Provon moisturizing lotion  Please read over the following fact sheets that you were given.

## 2023-05-17 ENCOUNTER — Encounter (HOSPITAL_COMMUNITY): Payer: Self-pay

## 2023-05-17 ENCOUNTER — Ambulatory Visit (HOSPITAL_COMMUNITY): Payer: Self-pay | Admitting: Orthopedic Surgery

## 2023-05-17 ENCOUNTER — Encounter (HOSPITAL_COMMUNITY)
Admission: RE | Admit: 2023-05-17 | Discharge: 2023-05-17 | Disposition: A | Discharge status: Home / Self Care | Payer: Commercial Managed Care - PPO | Source: Ambulatory Visit | Attending: Orthopedic Surgery | Admitting: Orthopedic Surgery

## 2023-05-17 ENCOUNTER — Other Ambulatory Visit: Payer: Self-pay

## 2023-05-17 VITALS — BP 116/81 | HR 93 | Temp 98.2°F | Resp 17 | Ht 64.5 in | Wt 173.0 lb

## 2023-05-17 DIAGNOSIS — E119 Type 2 diabetes mellitus without complications: Secondary | ICD-10-CM | POA: Insufficient documentation

## 2023-05-17 DIAGNOSIS — Z01818 Encounter for other preprocedural examination: Secondary | ICD-10-CM | POA: Insufficient documentation

## 2023-05-17 LAB — HEMOGLOBIN A1C
Hgb A1c MFr Bld: 5.2 % (ref 4.8–5.6)
Mean Plasma Glucose: 102.54 mg/dL

## 2023-05-17 LAB — SURGICAL PCR SCREEN
MRSA, PCR: NEGATIVE
Staphylococcus aureus: NEGATIVE

## 2023-05-17 LAB — GLUCOSE, CAPILLARY: Glucose-Capillary: 97 mg/dL (ref 70–99)

## 2023-05-17 NOTE — Progress Notes (Signed)
PCP - Jarrett Soho, PA-C Cardiologist - denies  PPM/ICD - denies   Chest x-ray - 01/27/19 EKG - 05/17/23 Stress Test - 20+ years ago per pt, in [place], normal per pt ECHO - 01/27/19 Cardiac Cath - denies  Sleep Study - denies   DM- pt does not check CBG at home and does not know typical fasting levels  Last dose of GLP1 agonist-  05/16/23 GLP1 instructions: Hold 7 days  ASA/Blood Thinner Instructions: n/a   ERAS Protcol - yes, no drink   COVID TEST- n/a   Anesthesia review: no  Patient denies shortness of breath, fever, cough and chest pain at PAT appointment   All instructions explained to the patient, with a verbal understanding of the material. Patient agrees to go over the instructions while at home for a better understanding. The opportunity to ask questions was provided.

## 2023-05-29 NOTE — Anesthesia Preprocedure Evaluation (Addendum)
 Anesthesia Evaluation  Patient identified by MRN, date of birth, ID band Patient awake    Reviewed: Allergy & Precautions, NPO status , Patient's Chart, lab work & pertinent test results  History of Anesthesia Complications (+) PONV and history of anesthetic complications  Airway Mallampati: II  TM Distance: >3 FB Neck ROM: Full    Dental no notable dental hx. (+) Teeth Intact, Dental Advisory Given   Pulmonary    Pulmonary exam normal breath sounds clear to auscultation       Cardiovascular hypertension, Normal cardiovascular exam Rhythm:Regular Rate:Normal  2020 echo nl   Neuro/Psych negative neurological ROS  negative psych ROS   GI/Hepatic ,GERD  Medicated,,  Endo/Other  diabetes, Type 2    Renal/GU      Musculoskeletal  (+) Arthritis ,  Hx of chronic pain syndrome   Abdominal   Peds  Hematology   Anesthesia Other Findings All: topirimate  Reproductive/Obstetrics                              Anesthesia Physical Anesthesia Plan  ASA: 2  Anesthesia Plan: General   Post-op Pain Management: Ketamine  IV*   Induction: Intravenous  PONV Risk Score and Plan: Treatment may vary due to age or medical condition, Midazolam , Ondansetron  and Dexamethasone   Airway Management Planned: Oral ETT  Additional Equipment: None  Intra-op Plan:   Post-operative Plan: Extubation in OR  Informed Consent: I have reviewed the patients History and Physical, chart, labs and discussed the procedure including the risks, benefits and alternatives for the proposed anesthesia with the patient or authorized representative who has indicated his/her understanding and acceptance.     Dental advisory given  Plan Discussed with: CRNA and Anesthesiologist  Anesthesia Plan Comments:          Anesthesia Quick Evaluation

## 2023-05-30 ENCOUNTER — Ambulatory Visit (HOSPITAL_COMMUNITY): Payer: Commercial Managed Care - PPO | Admitting: Anesthesiology

## 2023-05-30 ENCOUNTER — Encounter (HOSPITAL_COMMUNITY): Payer: Self-pay | Admitting: Orthopedic Surgery

## 2023-05-30 ENCOUNTER — Ambulatory Visit (HOSPITAL_COMMUNITY): Payer: Commercial Managed Care - PPO

## 2023-05-30 ENCOUNTER — Ambulatory Visit (HOSPITAL_BASED_OUTPATIENT_CLINIC_OR_DEPARTMENT_OTHER): Payer: Commercial Managed Care - PPO | Admitting: Anesthesiology

## 2023-05-30 ENCOUNTER — Other Ambulatory Visit: Payer: Self-pay

## 2023-05-30 ENCOUNTER — Observation Stay (HOSPITAL_COMMUNITY)
Admission: RE | Admit: 2023-05-30 | Discharge: 2023-06-01 | Disposition: A | Discharge status: Home / Self Care | Payer: Commercial Managed Care - PPO | Attending: Orthopedic Surgery | Admitting: Orthopedic Surgery

## 2023-05-30 ENCOUNTER — Encounter (HOSPITAL_COMMUNITY)
Admission: RE | Disposition: A | Discharge status: Home / Self Care | Payer: Self-pay | Source: Home / Self Care | Attending: Orthopedic Surgery

## 2023-05-30 DIAGNOSIS — G894 Chronic pain syndrome: Secondary | ICD-10-CM | POA: Diagnosis not present

## 2023-05-30 DIAGNOSIS — Y828 Other medical devices associated with adverse incidents: Secondary | ICD-10-CM | POA: Insufficient documentation

## 2023-05-30 DIAGNOSIS — I10 Essential (primary) hypertension: Secondary | ICD-10-CM | POA: Diagnosis not present

## 2023-05-30 DIAGNOSIS — T84226A Displacement of internal fixation device of vertebrae, initial encounter: Secondary | ICD-10-CM | POA: Diagnosis not present

## 2023-05-30 DIAGNOSIS — Z79899 Other long term (current) drug therapy: Secondary | ICD-10-CM | POA: Diagnosis not present

## 2023-05-30 DIAGNOSIS — T84296A Other mechanical complication of internal fixation device of vertebrae, initial encounter: Secondary | ICD-10-CM | POA: Diagnosis present

## 2023-05-30 DIAGNOSIS — E119 Type 2 diabetes mellitus without complications: Secondary | ICD-10-CM | POA: Insufficient documentation

## 2023-05-30 DIAGNOSIS — G8929 Other chronic pain: Secondary | ICD-10-CM | POA: Diagnosis present

## 2023-05-30 DIAGNOSIS — Z9689 Presence of other specified functional implants: Principal | ICD-10-CM

## 2023-05-30 HISTORY — PX: PARS PLANA VITRECTOMY 27 GAUGE WITH 25 GAUGE PORT: SHX6740

## 2023-05-30 HISTORY — PX: SPINAL CORD STIMULATOR INSERTION: SHX5378

## 2023-05-30 LAB — GLUCOSE, CAPILLARY: Glucose-Capillary: 101 mg/dL — ABNORMAL HIGH (ref 70–99)

## 2023-05-30 SURGERY — INSERTION, SPINAL CORD STIMULATOR, LUMBAR
Anesthesia: General | Site: Back

## 2023-05-30 MED ORDER — METHOCARBAMOL 1000 MG/10ML IJ SOLN
500.0000 mg | Freq: Four times a day (QID) | INTRAMUSCULAR | Status: DC | PRN
  Administered 2023-05-31: 500 mg via INTRAVENOUS
  Filled 2023-05-30: qty 10

## 2023-05-30 MED ORDER — PHENOL 1.4 % MT LIQD: 1.0000 | OROMUCOSAL | Status: DC | PRN

## 2023-05-30 MED ORDER — ONDANSETRON HCL 4 MG/2ML IJ SOLN
4.0000 mg | Freq: Four times a day (QID) | INTRAMUSCULAR | Status: DC | PRN
Start: 2023-05-30 — End: 2023-06-01

## 2023-05-30 MED ORDER — FENTANYL CITRATE (PF) 250 MCG/5ML IJ SOLN
INTRAMUSCULAR | Status: DC | PRN
  Administered 2023-05-30: 100 ug via INTRAVENOUS
  Administered 2023-05-30: 50 ug via INTRAVENOUS

## 2023-05-30 MED ORDER — HYDROMORPHONE HCL 1 MG/ML IJ SOLN
INTRAMUSCULAR | Status: AC
  Filled 2023-05-30: qty 1

## 2023-05-30 MED ORDER — HYDROMORPHONE HCL 1 MG/ML IJ SOLN
1.0000 mg | INTRAMUSCULAR | Status: AC | PRN
  Administered 2023-05-30 – 2023-05-31 (×4): 1 mg via INTRAVENOUS
  Filled 2023-05-30 (×3): qty 1

## 2023-05-30 MED ORDER — METHOCARBAMOL 500 MG PO TABS
500.0000 mg | ORAL_TABLET | Freq: Four times a day (QID) | ORAL | Status: DC | PRN
  Administered 2023-05-30 – 2023-06-01 (×4): 500 mg via ORAL
  Filled 2023-05-30 (×4): qty 1

## 2023-05-30 MED ORDER — ORAL CARE MOUTH RINSE: 15.0000 mL | Freq: Once | OROMUCOSAL | Status: AC

## 2023-05-30 MED ORDER — PHENYLEPHRINE HCL-NACL 20-0.9 MG/250ML-% IV SOLN
INTRAVENOUS | Status: DC | PRN
  Administered 2023-05-30: 80 ug via INTRAVENOUS
  Administered 2023-05-30: 50 ug/min via INTRAVENOUS
  Administered 2023-05-30 (×2): 80 ug via INTRAVENOUS

## 2023-05-30 MED ORDER — KETAMINE HCL 50 MG/5ML IJ SOSY
PREFILLED_SYRINGE | INTRAMUSCULAR | Status: AC
  Filled 2023-05-30: qty 5

## 2023-05-30 MED ORDER — INSULIN ASPART 100 UNIT/ML IJ SOLN: 0.0000 [IU] | INTRAMUSCULAR | Status: DC | PRN

## 2023-05-30 MED ORDER — PROPOFOL 10 MG/ML IV BOLUS
INTRAVENOUS | Status: DC | PRN
  Administered 2023-05-30: 125 ug/kg/min via INTRAVENOUS
  Administered 2023-05-30: 200 mg via INTRAVENOUS

## 2023-05-30 MED ORDER — ONDANSETRON HCL 4 MG PO TABS
4.0000 mg | ORAL_TABLET | Freq: Four times a day (QID) | ORAL | Status: DC | PRN
  Filled 2023-05-30: qty 1

## 2023-05-30 MED ORDER — OXYCODONE HCL 5 MG/5ML PO SOLN: 5.0000 mg | Freq: Once | ORAL | Status: DC | PRN

## 2023-05-30 MED ORDER — AMISULPRIDE (ANTIEMETIC) 5 MG/2ML IV SOLN
10.0000 mg | Freq: Once | INTRAVENOUS | Status: DC | PRN
Start: 2023-05-30 — End: 2023-05-30

## 2023-05-30 MED ORDER — ACETAMINOPHEN 650 MG RE SUPP: 650.0000 mg | RECTAL | Status: DC | PRN

## 2023-05-30 MED ORDER — SODIUM CHLORIDE 0.9% FLUSH
3.0000 mL | Freq: Two times a day (BID) | INTRAVENOUS | Status: DC
  Administered 2023-05-30 – 2023-06-01 (×4): 3 mL via INTRAVENOUS

## 2023-05-30 MED ORDER — LACTATED RINGERS IV SOLN: INTRAVENOUS | Status: DC

## 2023-05-30 MED ORDER — 0.9 % SODIUM CHLORIDE (POUR BTL) OPTIME
TOPICAL | Status: DC | PRN
  Administered 2023-05-30: 1000 mL

## 2023-05-30 MED ORDER — FENTANYL CITRATE (PF) 250 MCG/5ML IJ SOLN
INTRAMUSCULAR | Status: AC
  Filled 2023-05-30: qty 5

## 2023-05-30 MED ORDER — ACETAMINOPHEN 10 MG/ML IV SOLN
1000.0000 mg | Freq: Once | INTRAVENOUS | Status: DC | PRN
  Administered 2023-05-30: 1000 mg via INTRAVENOUS

## 2023-05-30 MED ORDER — BUPIVACAINE LIPOSOME 1.3 % IJ SUSP
INTRAMUSCULAR | Status: AC
  Filled 2023-05-30: qty 20

## 2023-05-30 MED ORDER — SURGIFLO WITH THROMBIN (HEMOSTATIC MATRIX KIT) OPTIME
TOPICAL | Status: DC | PRN
  Administered 2023-05-30: 2 via TOPICAL

## 2023-05-30 MED ORDER — SODIUM CHLORIDE 0.9% FLUSH: 3.0000 mL | INTRAVENOUS | Status: DC | PRN

## 2023-05-30 MED ORDER — CHLORHEXIDINE GLUCONATE 0.12 % MT SOLN
15.0000 mL | Freq: Once | OROMUCOSAL | Status: AC
  Administered 2023-05-30: 15 mL via OROMUCOSAL
  Filled 2023-05-30: qty 15

## 2023-05-30 MED ORDER — MIDAZOLAM HCL 2 MG/2ML IJ SOLN
INTRAMUSCULAR | Status: AC
  Filled 2023-05-30: qty 2

## 2023-05-30 MED ORDER — METHOCARBAMOL 500 MG PO TABS: 500.0000 mg | ORAL_TABLET | Freq: Three times a day (TID) | ORAL | 0 refills | Status: AC | PRN

## 2023-05-30 MED ORDER — THROMBIN 20000 UNITS EX SOLR
CUTANEOUS | Status: AC
Start: 2023-05-30 — End: ?
  Filled 2023-05-30: qty 20000

## 2023-05-30 MED ORDER — OXYCODONE HCL 5 MG PO TABS: 5.0000 mg | ORAL_TABLET | Freq: Once | ORAL | Status: DC | PRN

## 2023-05-30 MED ORDER — THROMBIN 20000 UNITS EX KIT: PACK | CUTANEOUS | Status: DC | PRN

## 2023-05-30 MED ORDER — OXYCODONE HCL 5 MG PO TABS
5.0000 mg | ORAL_TABLET | ORAL | Status: DC | PRN
Start: 2023-05-30 — End: 2023-06-01

## 2023-05-30 MED ORDER — IRBESARTAN 300 MG PO TABS
300.0000 mg | ORAL_TABLET | Freq: Every day | ORAL | Status: DC
  Administered 2023-05-31: 300 mg via ORAL
  Filled 2023-05-30 (×2): qty 1

## 2023-05-30 MED ORDER — SCOPOLAMINE 1 MG/3DAYS TD PT72
1.0000 | MEDICATED_PATCH | TRANSDERMAL | Status: DC
  Administered 2023-05-30: 1.5 mg via TRANSDERMAL

## 2023-05-30 MED ORDER — HYDROMORPHONE HCL 1 MG/ML IJ SOLN
0.2500 mg | INTRAMUSCULAR | Status: DC | PRN
  Administered 2023-05-30 (×3): 0.5 mg via INTRAVENOUS

## 2023-05-30 MED ORDER — DEXAMETHASONE SODIUM PHOSPHATE 10 MG/ML IJ SOLN
INTRAMUSCULAR | Status: DC | PRN
  Administered 2023-05-30: 5 mg via INTRAVENOUS

## 2023-05-30 MED ORDER — TRANEXAMIC ACID-NACL 1000-0.7 MG/100ML-% IV SOLN
1000.0000 mg | INTRAVENOUS | Status: AC
  Administered 2023-05-30: 1000 mg via INTRAVENOUS
  Filled 2023-05-30: qty 100

## 2023-05-30 MED ORDER — SODIUM CHLORIDE 0.9 % IV SOLN
250.0000 mL | INTRAVENOUS | Status: AC
  Administered 2023-05-31: 250 mL via INTRAVENOUS

## 2023-05-30 MED ORDER — PROPOFOL 10 MG/ML IV BOLUS
INTRAVENOUS | Status: AC
  Filled 2023-05-30: qty 20

## 2023-05-30 MED ORDER — DULOXETINE HCL 30 MG PO CPEP
30.0000 mg | ORAL_CAPSULE | Freq: Every day | ORAL | Status: DC
  Administered 2023-05-31 – 2023-06-01 (×2): 30 mg via ORAL
  Filled 2023-05-30 (×2): qty 1

## 2023-05-30 MED ORDER — ROCURONIUM BROMIDE 10 MG/ML (PF) SYRINGE
PREFILLED_SYRINGE | INTRAVENOUS | Status: DC | PRN
  Administered 2023-05-30: 60 mg via INTRAVENOUS
  Administered 2023-05-30: 20 mg via INTRAVENOUS

## 2023-05-30 MED ORDER — ACETAMINOPHEN 325 MG PO TABS
650.0000 mg | ORAL_TABLET | ORAL | Status: DC | PRN
  Administered 2023-05-30 – 2023-05-31 (×2): 650 mg via ORAL
  Filled 2023-05-30 (×2): qty 2

## 2023-05-30 MED ORDER — DEXMEDETOMIDINE HCL IN NACL 80 MCG/20ML IV SOLN
INTRAVENOUS | Status: AC
  Filled 2023-05-30: qty 20

## 2023-05-30 MED ORDER — ONDANSETRON HCL 4 MG/2ML IJ SOLN
INTRAMUSCULAR | Status: DC | PRN
  Administered 2023-05-30: 4 mg via INTRAVENOUS

## 2023-05-30 MED ORDER — BUPIVACAINE-EPINEPHRINE 0.25% -1:200000 IJ SOLN
INTRAMUSCULAR | Status: DC | PRN
  Administered 2023-05-30: 25 mL

## 2023-05-30 MED ORDER — DEXMEDETOMIDINE HCL IN NACL 80 MCG/20ML IV SOLN
INTRAVENOUS | Status: DC | PRN
  Administered 2023-05-30 (×2): 8 ug via INTRAVENOUS

## 2023-05-30 MED ORDER — ACETAMINOPHEN 10 MG/ML IV SOLN
INTRAVENOUS | Status: AC
  Filled 2023-05-30: qty 100

## 2023-05-30 MED ORDER — LIDOCAINE HCL (PF) 2 % IJ SOLN
INTRAMUSCULAR | Status: DC | PRN
  Administered 2023-05-30: 100 mg via INTRADERMAL

## 2023-05-30 MED ORDER — DEXAMETHASONE SODIUM PHOSPHATE 10 MG/ML IJ SOLN
INTRAMUSCULAR | Status: AC
Start: 2023-05-30 — End: ?
  Filled 2023-05-30: qty 1

## 2023-05-30 MED ORDER — CEFAZOLIN SODIUM-DEXTROSE 1-4 GM/50ML-% IV SOLN
1.0000 g | Freq: Three times a day (TID) | INTRAVENOUS | Status: AC
  Administered 2023-05-30 – 2023-05-31 (×2): 1 g via INTRAVENOUS
  Filled 2023-05-30 (×2): qty 50

## 2023-05-30 MED ORDER — CEFAZOLIN SODIUM-DEXTROSE 2-4 GM/100ML-% IV SOLN
2.0000 g | INTRAVENOUS | Status: AC
  Administered 2023-05-30: 2 g via INTRAVENOUS
  Filled 2023-05-30: qty 100

## 2023-05-30 MED ORDER — ROCURONIUM BROMIDE 10 MG/ML (PF) SYRINGE
PREFILLED_SYRINGE | INTRAVENOUS | Status: AC
  Filled 2023-05-30: qty 10

## 2023-05-30 MED ORDER — SUGAMMADEX SODIUM 200 MG/2ML IV SOLN
INTRAVENOUS | Status: DC | PRN
  Administered 2023-05-30 (×2): 100 mg via INTRAVENOUS

## 2023-05-30 MED ORDER — MIDAZOLAM HCL 2 MG/2ML IJ SOLN
INTRAMUSCULAR | Status: DC | PRN
  Administered 2023-05-30: 2 mg via INTRAVENOUS

## 2023-05-30 MED ORDER — ONDANSETRON HCL 4 MG/2ML IJ SOLN
4.0000 mg | Freq: Once | INTRAMUSCULAR | Status: DC | PRN
Start: 2023-05-30 — End: 2023-05-30

## 2023-05-30 MED ORDER — MENTHOL 3 MG MT LOZG: 1.0000 | LOZENGE | OROMUCOSAL | Status: DC | PRN

## 2023-05-30 MED ORDER — OXYCODONE HCL 5 MG PO TABS
10.0000 mg | ORAL_TABLET | ORAL | Status: DC | PRN
  Administered 2023-05-30 – 2023-06-01 (×6): 10 mg via ORAL
  Filled 2023-05-30 (×7): qty 2

## 2023-05-30 MED ORDER — HYDROCHLOROTHIAZIDE 12.5 MG PO TABS
12.5000 mg | ORAL_TABLET | Freq: Every day | ORAL | Status: DC
  Administered 2023-05-31 – 2023-06-01 (×2): 12.5 mg via ORAL
  Filled 2023-05-30 (×2): qty 1

## 2023-05-30 MED ORDER — BUPIVACAINE LIPOSOME 1.3 % IJ SUSP
INTRAMUSCULAR | Status: DC | PRN
  Administered 2023-05-30: 20 mL

## 2023-05-30 MED ORDER — ONDANSETRON HCL 4 MG/2ML IJ SOLN
INTRAMUSCULAR | Status: AC
  Filled 2023-05-30: qty 2

## 2023-05-30 MED ORDER — LIDOCAINE 2% (20 MG/ML) 5 ML SYRINGE
INTRAMUSCULAR | Status: AC
Start: 2023-05-30 — End: ?
  Filled 2023-05-30: qty 5

## 2023-05-30 MED ORDER — OLMESARTAN MEDOXOMIL-HCTZ 40-12.5 MG PO TABS: 1.0000 | ORAL_TABLET | Freq: Every day | ORAL | Status: DC

## 2023-05-30 MED ORDER — GABAPENTIN 300 MG PO CAPS
300.0000 mg | ORAL_CAPSULE | Freq: Two times a day (BID) | ORAL | Status: DC
  Administered 2023-05-30 – 2023-06-01 (×4): 300 mg via ORAL
  Filled 2023-05-30 (×4): qty 1

## 2023-05-30 MED ORDER — LACTATED RINGERS IV SOLN: INTRAVENOUS | Status: AC

## 2023-05-30 MED ORDER — BUPIVACAINE-EPINEPHRINE (PF) 0.25% -1:200000 IJ SOLN
INTRAMUSCULAR | Status: AC
  Filled 2023-05-30: qty 30

## 2023-05-30 MED ORDER — KETAMINE HCL 10 MG/ML IJ SOLN
INTRAMUSCULAR | Status: DC | PRN
  Administered 2023-05-30: 10 mg via INTRAVENOUS
  Administered 2023-05-30: 20 mg via INTRAVENOUS
  Administered 2023-05-30 (×2): 10 mg via INTRAVENOUS

## 2023-05-30 MED ORDER — SCOPOLAMINE 1 MG/3DAYS TD PT72
MEDICATED_PATCH | TRANSDERMAL | Status: AC
  Administered 2023-05-30: 1.5 mg
  Filled 2023-05-30: qty 1

## 2023-05-30 MED ORDER — POLYETHYLENE GLYCOL 3350 17 G PO PACK
17.0000 g | PACK | Freq: Every day | ORAL | Status: DC | PRN
  Administered 2023-05-31: 17 g via ORAL
  Filled 2023-05-30: qty 1

## 2023-05-30 MED ORDER — MAGNESIUM CITRATE PO SOLN: 1.0000 | Freq: Once | ORAL | Status: DC | PRN

## 2023-05-30 MED ORDER — OXYCODONE-ACETAMINOPHEN 10-325 MG PO TABS: 1.0000 | ORAL_TABLET | Freq: Four times a day (QID) | ORAL | 0 refills | Status: AC | PRN

## 2023-05-30 SURGICAL SUPPLY — 62 items
ANCHOR SWIFT LOCK ×2 IMPLANT
BAG COUNTER SPONGE SURGICOUNT (BAG) IMPLANT
CANISTER SUCT 3000ML PPV (MISCELLANEOUS) ×1 IMPLANT
CHARGER BATTERY NEUROSTIM ABT (NEUROSURGERY SUPPLIES) IMPLANT
CLSR STERI-STRIP ANTIMIC 1/2X4 (GAUZE/BANDAGES/DRESSINGS) ×1 IMPLANT
CONTROLLER NEUROSTIM PATIENT (NEUROSURGERY SUPPLIES) IMPLANT
COVER MAYO STAND STRL (DRAPES) ×1 IMPLANT
COVER PROBE W GEL 5X96 (DRAPES) IMPLANT
COVER SURGICAL LIGHT HANDLE (MISCELLANEOUS) ×1 IMPLANT
DRAPE C-ARM 42X72 X-RAY (DRAPES) ×1 IMPLANT
DRAPE SURG 17X23 STRL (DRAPES) ×1 IMPLANT
DRAPE U-SHAPE 47X51 STRL (DRAPES) ×1 IMPLANT
DRSG OPSITE POSTOP 4X6 (GAUZE/BANDAGES/DRESSINGS) ×1 IMPLANT
DURAPREP 26ML APPLICATOR (WOUND CARE) ×1 IMPLANT
ELECT BLADE 4.0 EZ CLEAN MEGAD (MISCELLANEOUS)
ELECT CAUTERY BLADE 6.4 (BLADE) IMPLANT
ELECT PENCIL ROCKER SW 15FT (MISCELLANEOUS) ×1 IMPLANT
ELECT REM PT RETURN 9FT ADLT (ELECTROSURGICAL) ×1
ELECTRODE BLDE 4.0 EZ CLN MEGD (MISCELLANEOUS) IMPLANT
ELECTRODE REM PT RTRN 9FT ADLT (ELECTROSURGICAL) ×1 IMPLANT
GENERATOR NEUROSTIM ETERNA 16 (Generator) IMPLANT
GLOVE BIO SURGEON STRL SZ7 (GLOVE) ×1 IMPLANT
GLOVE BIOGEL PI IND STRL 7.0 (GLOVE) ×1 IMPLANT
GLOVE BIOGEL PI IND STRL 8.5 (GLOVE) ×1 IMPLANT
GLOVE SS N UNI LF 8.5 STRL (GLOVE) ×1 IMPLANT
GOWN STRL REUS W/ TWL LRG LVL3 (GOWN DISPOSABLE) ×2 IMPLANT
GOWN STRL REUS W/TWL 2XL LVL3 (GOWN DISPOSABLE) ×1 IMPLANT
KIT BASIN OR (CUSTOM PROCEDURE TRAY) ×1 IMPLANT
KIT CHARGER NEUROSTIM ABT (NEUROSURGERY SUPPLIES) IMPLANT
KIT TURNOVER KIT B (KITS) ×1 IMPLANT
LEAD OCTRODE GEN 8CH 60CM (Lead) IMPLANT
MAGNET NEUROSTIM ETERNA PAT (NEUROSURGERY SUPPLIES) IMPLANT
MANUAL PATIENT NEUROSTIM DISP (MISCELLANEOUS) IMPLANT
NDL 22X1.5 STRL (OR ONLY) (MISCELLANEOUS) ×1 IMPLANT
NDL SPNL 18GX3.5 QUINCKE PK (NEEDLE) ×1 IMPLANT
NEEDLE 22X1.5 STRL (OR ONLY) (MISCELLANEOUS) ×1
NEEDLE SPNL 18GX3.5 QUINCKE PK (NEEDLE) ×1
NS IRRIG 1000ML POUR BTL (IV SOLUTION) ×1 IMPLANT
PACK LAMINECTOMY ORTHO (CUSTOM PROCEDURE TRAY) ×1 IMPLANT
PACK UNIVERSAL I (CUSTOM PROCEDURE TRAY) ×1 IMPLANT
PAD ARMBOARD 7.5X6 YLW CONV (MISCELLANEOUS) ×3 IMPLANT
PATTIES SURGICAL .5 X.5 (GAUZE/BANDAGES/DRESSINGS) IMPLANT
SPATULA SILICONE BRAIN 10MM (MISCELLANEOUS) IMPLANT
SPONGE SURGIFOAM ABS GEL 100 (HEMOSTASIS) ×1 IMPLANT
SPONGE T-LAP 4X18 ~~LOC~~+RFID (SPONGE) IMPLANT
STAPLER VISISTAT 35W (STAPLE) IMPLANT
SURGIFLO W/THROMBIN 8M KIT (HEMOSTASIS) ×1 IMPLANT
SUT BONE WAX W31G (SUTURE) ×1 IMPLANT
SUT ETHIBOND 2 OS 4 DA (SUTURE) ×1 IMPLANT
SUT ETHIBOND NAB CT1 #1 30IN (SUTURE) IMPLANT
SUT MNCRL AB 3-0 PS2 18 (SUTURE) ×2 IMPLANT
SUT MNCRL+ AB 3-0 CT1 36 (SUTURE) IMPLANT
SUT STRATAFIX 1PDS 45CM VIOLET (SUTURE) IMPLANT
SUT VIC AB 0 CT1 27XBRD ANBCTR (SUTURE) IMPLANT
SUT VIC AB 1 CT1 18XCR BRD 8 (SUTURE) ×2 IMPLANT
SUT VIC AB 2-0 CT1 18 (SUTURE) ×1 IMPLANT
SYR BULB IRRIG 60ML STRL (SYRINGE) ×1 IMPLANT
SYR CONTROL 10ML LL (SYRINGE) ×1 IMPLANT
TOWEL GREEN STERILE (TOWEL DISPOSABLE) ×1 IMPLANT
TOWEL GREEN STERILE FF (TOWEL DISPOSABLE) ×1 IMPLANT
WATER STERILE IRR 1000ML POUR (IV SOLUTION) ×1 IMPLANT
YANKAUER SUCT BULB TIP NO VENT (SUCTIONS) ×1 IMPLANT

## 2023-05-30 NOTE — Anesthesia Postprocedure Evaluation (Signed)
 Anesthesia Post Note  Patient: Lisa Novak  Procedure(s) Performed: SPINAL CORD STIMULATOR INSERTION, Removal of pedicle screw (Back) REMOVAL POSTERIOR SPINAL INSTRUMENTATION (PEDICLE SCREW) (Back)     Patient location during evaluation: PACU Anesthesia Type: General Level of consciousness: awake and alert Pain management: pain level controlled Vital Signs Assessment: post-procedure vital signs reviewed and stable Respiratory status: spontaneous breathing, nonlabored ventilation, respiratory function stable and patient connected to nasal cannula oxygen Cardiovascular status: blood pressure returned to baseline and stable Postop Assessment: no apparent nausea or vomiting Anesthetic complications: no   No notable events documented.  Last Vitals:  Vitals:   05/30/23 1245 05/30/23 1300  BP: 104/71 102/69  Pulse: 94 77  Resp: 14 13  Temp:    SpO2: 93% 95%    Last Pain:  Vitals:   05/30/23 1300  PainSc: Asleep                 Garnette DELENA Gab

## 2023-05-30 NOTE — Anesthesia Procedure Notes (Signed)
 Procedure Name: Intubation Date/Time: 05/30/2023 7:57 AM  Performed by: Neida Ellegood A, CRNAPre-anesthesia Checklist: Patient identified, Emergency Drugs available, Suction available and Patient being monitored Patient Re-evaluated:Patient Re-evaluated prior to induction Oxygen Delivery Method: Circle System Utilized Preoxygenation: Pre-oxygenation with 100% oxygen Induction Type: IV induction Ventilation: Mask ventilation without difficulty Laryngoscope Size: Mac and 3 Grade View: Grade II Tube type: Oral Tube size: 7.0 mm Number of attempts: 1 Airway Equipment and Method: Stylet and Oral airway Placement Confirmation: ETT inserted through vocal cords under direct vision, positive ETCO2 and breath sounds checked- equal and bilateral Secured at: 22 cm Tube secured with: Tape Dental Injury: Teeth and Oropharynx as per pre-operative assessment

## 2023-05-30 NOTE — Transfer of Care (Signed)
 Immediate Anesthesia Transfer of Care Note  Patient: Lisa Novak  Procedure(s) Performed: SPINAL CORD STIMULATOR INSERTION, Removal of pedicle screw (Back) REMOVAL POSTERIOR SPINAL INSTRUMENTATION (PEDICLE SCREW) (Back)  Patient Location: PACU  Anesthesia Type:General  Level of Consciousness: awake  Airway & Oxygen Therapy: Patient Spontanous Breathing and Patient connected to nasal cannula oxygen  Post-op Assessment: Report given to RN and Post -op Vital signs reviewed and stable  Post vital signs: Reviewed  Last Vitals:  Vitals Value Taken Time  BP 79/49 05/30/23 1115  Temp 36.3 C 05/30/23 1110  Pulse 100 05/30/23 1116  Resp 17 05/30/23 1116  SpO2 93 % 05/30/23 1116  Vitals shown include unfiled device data.  Last Pain:  Vitals:   05/30/23 1110  PainSc: Asleep      Patients Stated Pain Goal: 2 (05/30/23 9391)  Complications: No notable events documented.

## 2023-05-30 NOTE — H&P (Signed)
 History:  The patient is a 53 year old female who presents for pre-operative visit in preparation for their SCS placement, removal of pedical screw, which is scheduled on 05/30/23 with Dr. Donaciano Sprang, MD at [facility]. The patient has had symptoms in the Back including pain which has impacted their quality of life and ability to do activities of daily living. The patient currently has a diagnosis of lumbar post-laminectomy syndrome and has failed conservative treatments including activity modification. The patient has had previous lumbar fusion . The patient denies an active infection.   Past Medical History:  Diagnosis Date   Diabetes type 2, controlled (HCC)    Diverticulitis    GERD (gastroesophageal reflux disease)    Hypertension    PONV (postoperative nausea and vomiting)    RA (rheumatoid arthritis) (HCC)    SVD (spontaneous vaginal delivery)    x 3   Umbilical hernia     Allergies  Allergen Reactions   Topiramate Other (See Comments)    tachycardia    No current facility-administered medications on file prior to encounter.   Current Outpatient Medications on File Prior to Encounter  Medication Sig Dispense Refill   DULoxetine  (CYMBALTA ) 30 MG capsule Take 30 mg by mouth daily.     folic acid (FOLVITE) 1 MG tablet Take 2 mg by mouth daily.     gabapentin  (NEURONTIN ) 300 MG capsule Take 300 mg by mouth 2 (two) times daily.     HYDROcodone -acetaminophen  (NORCO) 10-325 MG tablet Take 0.5-1 tablets by mouth every 6 (six) hours as needed for moderate pain (pain score 4-6).     olmesartan -hydrochlorothiazide  (BENICAR  HCT) 40-12.5 MG tablet Take 1 tablet by mouth daily.     omeprazole  (PRILOSEC) 20 MG capsule TAKE 1 CAPSULE BY MOUTH EVERY DAY 90 capsule 0   ondansetron  (ZOFRAN -ODT) 4 MG disintegrating tablet Take 4 mg by mouth every 8 (eight) hours as needed for vomiting or nausea.     OZEMPIC, 1 MG/DOSE, 4 MG/3ML SOPN Inject 1 mg as directed every Thursday.      sulfaSALAzine (AZULFIDINE) 500 MG tablet Take 1,000 mg by mouth daily.     calcium carbonate (TUMS - DOSED IN MG ELEMENTAL CALCIUM) 500 MG chewable tablet Chew 1 tablet by mouth daily as needed for indigestion or heartburn.      Physical Exam: Vitals:   05/30/23 0603  BP: 112/73  Pulse: 94  Resp: 16  Temp: 98.2 F (36.8 C)  SpO2: 97%   Body mass index is 29.24 kg/m. Clinical exam: Lisa Novak is a pleasant individual, who appears younger than their stated age.  She is alert and orientated 3.  No shortness of breath, chest pain.  Abdomen is soft and non-tender, negative loss of bowel and bladder control, no rebound tenderness.  Negative: skin lesions abrasions contusions  Peripheral pulses: 2+ peripheral pulses bilaterally. LE compartments are: Soft and nontender.  Gait pattern: Altered gait pattern due to severe back buttock and radicular left leg pain  Assistive devices: None  Neuro: Positive pain and dysesthesias in the L4, L5, and S1 dermatomes on the left side. Negative straight leg raise test. Trace weakness of the EHL/tibialis anterior/gastrocnemius on the left side. 5/5 motor strength in the right lower extremity. Negative Babinski test, no clonus.  Musculoskeletal: Severe back pain with palpation and range of motion. Positive left SI joint pain.  Imaging: X-rays of the lumbar spine demonstrate an L4-5 intervertebral cage with unilateral pedicle screw fixation. The L4  pedicle seems to have migrated superiorly and is now in the L3-4 foramen. Patient appears to have a solid intervertebral fusion at L4-5.  X-rays of the pelvis demonstrate 3 left SI fusion screws. The inferior screw appears to violate the lateral border of the S1 foramen.  Thoracic MRI: completed on 04/12/2023. Small left disc protrusion at T7-8 and T8-9 without central stenosis. No cord signal changes. Mild foraminal stenosis T8-11. No contraindications for placement of a spinal cord stimulator.  Lumbar CT  scan: completed on 04/24/2023. Right L4 pedicle screw breaches the superior aspect of the right L4 pedicle and enters into the L3-4 foramen. It does contact the L3 nerve root. There is solid bridging bone at L4-5 with no evidence of subsidence of the intervertebral cage. The images of the pelvis demonstrate that the SI fusion screws are positioned within the bone and do not violate the foramen. No evidence to of a solid SI fusion.   A/P:  Lisa Novak is a very pleasant 53 year old woman whose had multiple lumbar surgeries and has had severe pain following her most recent SI fusion. Imaging studies show that the L4 pedicle screw on the right side is malpositioned. While she is currently not having any significant L3 right-sided radicular leg pain she has her state that the screw be removed. In addition she would like to move forward with placement of the spinal cord stimulator having had a successful trial. The patient notes that she had greater than 75% improvement in her pain and quality of life following the spinal cord stimulator trial. Risks and benefits of surgery were discussed with the patient. These include: Infection, bleeding, death, stroke, paralysis, ongoing or worse pain, need for additional surgery, nonunion, leak of spinal fluid, adjacent segment degeneration requiring additional fusion surgery. Pseudoarthrosis (nonunion)requiring supplemental posterior fixation. Throat pain, swallowing difficulties, hoarseness or change in voice. Surgical plan: 1. Spinal cord stimulator placement. 2. Removal of right sided pedicle screw construct L4-5 secondary to malpositioned L4 screw.

## 2023-05-30 NOTE — Discharge Instructions (Signed)

## 2023-05-30 NOTE — Op Note (Signed)
 OPERATIVE REPORT  DATE OF SURGERY: 05/30/2023  PATIENT NAME:  Lisa Novak MRN: [id number] DOB: 1970/12/13  PCP: Stuart Norris, NP  PRE-OPERATIVE DIAGNOSIS: 1.  Failed back syndrome.  Status post successful spinal cord stimulator trial. 2.  Malpositioned lumbar pedicle screw construct  POST-OPERATIVE DIAGNOSIS: Same  PROCEDURE:   1.  Implantation of spinal cord stimulator. 2.  Removal of pedicle screw rod construct L4-5.  SURGEON:  Donaciano Sprang, MD  PHYSICIAN ASSISTANT: Jeoffrey Sages  ANESTHESIA:   General  EBL: 150 ml   Complications: None  Implants: Abbott spinal cord stimulator system.  Eterna generator.Octrode leads x 2.  Removed DePuy Viper pedicle screws right side at L4 and L5 with the rod.  BRIEF HISTORY: Lisa Novak is a 53 y.o. female who has had chronic debilitating back and neuropathic leg pain.  Imaging studies demonstrated the right L4 pedicle had violated the superior portion of the pedicle and was contacting the L3 nerve root.  In addition the patient had a spinal cord stimulator trial and had significant overall improvement in her quality of life.  As a result of the successful spinal cord stimulator trial we elected to move forward with surgery.  All appropriate risks, benefits, and alternatives to surgery were discussed with the patient and consent was obtained.  PROCEDURE DETAILS: Patient was brought into the operating room and was properly positioned on the operating room table.  After induction with general anesthesia the patient was endotracheally intubated.  A timeout was taken to confirm all important data: including patient, procedure, and the level. Teds, SCD's were applied.   The patient was turned prone onto the Wilson frame.  All bony prominences were well-padded and the thoracolumbar spine was prepped and draped in a standard fashion.  Fluoroscopic guidance was used to localize the L4-5 pedicle screw construct and I marked out my incision  site and infiltrated with quarter percent Marcaine .  Skin incision was made and I sharply dissected down to the deep fascia.  I incised the deep fascia I then bluntly dissected through the paraspinal muscles until I palpated the pedicle screws.  I then mobilized the soft tissue and placed a retractor to visualize the implants.  The locking caps were then removed followed by the rod.  The pedicle screws were also removed.  I irrigated the wound copiously with normal saline.  Once I confirmed hemostasis I placed some Floseal and removed the retractor.  I then closed the wound in a layered fashion with interrupted #1 Vicryl sutures for the deep fascia, followed by interrupted 2-0 Vicryl sutures and 3-0 Monocryl for the skin.  I then injected 20 cc of Exparel  into the paraspinal muscles for postoperative analgesia.  I now turned my attention to implantation of spinal cord stimulator.  Identified the T10 and T12 levels and marked out my midline incision to span the space.  I infiltrated the incision with quarter percent Marcaine  with epinephrine .  Midline incision was made and sharp dissection was carried out down to the deep fascia.  I incised the deep fascia and stripped the paraspinal muscles to expose the T10, T11, and a portion of T12 spinous process and lamina.  Fluoroscopy was then used to confirm my location.  Once I confirmed the T10 level a laminotomy was performed with 2 mm Kerrison rongeur.  I then gently dissected through the central raphae of the ligamentum flavum to create a plane with the Penfield 4 between the thecal sac and ligamentum flavum.  The 2  mm Kerrison rongeur was then used to remove the ligamentum flavum and expose the dorsal epidural fat.  Once this was done I then obtained my first lead and gently advanced it just to the right of midline to the midportion of T7.  I confirmed satisfactory positioning with both AP and lateral fluoroscopy.  I also confirmed with the representative that this  was more than acceptable location.  The second lead was obtained and placed to the left of the first.  This spanned from the superior aspect of T10 to the midportion of T8.  I again confirmed with the representative that it was properly placed and that she would receive similar coverage as a trial.  Once I confirm satisfactory positioning of the lead I then secured them with a Swift lock device directly to the spinous process of T11.  This was done with a number 2 Ethibond suture.  Once it was secured I then passed the leads through the T11-12 interspinous process space in order to wrap it around the T11 spinous process itself for added support to prevent migration.  With the leads properly secured I checked my AP and lateral views to demonstrate that they had not migrated during the securing phase and they were properly positioned in both planes.  A second incision was made and I created a cavity approximately 2-1/2 cm deep for the battery.  I then used the submuscular passer to pass the leads from the thoracic wound to the battery site.  I then secured the leads to the battery and tightened it according manufacture standards.  The excess lead was wrapped and placed on the undersurface of the battery and it was placed in the pocket.  I then secured the battery with two #1 Vicryl sutures.  The implant was now tested by the representative and it was functioning appropriately.  Both wounds were now copiously irrigated with normal saline and closed in a similar fashion.  Deep fascia was closed with interrupted #1 Vicryl suture followed by interrupted 2-0 Vicryl sutures finally 3-0 Monocryl for the skin.  Steri-Strips dry dressings were placed and all 3 wounds.  Patient was ultimately extubated transfer the PACU without incident.  The end of the case all needle sponge sponge counts were correct.  Donaciano Sprang, MD 05/30/2023 11:00 AM

## 2023-05-30 NOTE — Brief Op Note (Signed)
 05/30/2023  11:12 AM  PATIENT:  Lisa Novak  53 y.o. female  PRE-OPERATIVE DIAGNOSIS:  Malpositionedlumbar pedicle screw, chronic pain syndrome, status post successful spinal cord stimulator trial  POST-OPERATIVE DIAGNOSIS:  Malpositionedlumbar pedicle screw, chronic pain syndrome, status post successful spinal cord stimulator trial  PROCEDURE:  Procedure(s) with comments: SPINAL CORD STIMULATOR INSERTION, Removal of pedicle screw (N/A) - 4 hrs 3 C-Bed REMOVAL POSTERIOR SPINAL INSTRUMENTATION (PEDICLE SCREW) (N/A)  SURGEON:  Surgeons and Role:    DEWAINE Burnetta Aures, MD - Primary  PHYSICIAN ASSISTANT:   ASSISTANTS: Jeoffrey Sages   ANESTHESIA:   general  EBL:  150 mL   BLOOD ADMINISTERED:none  DRAINS: none   LOCAL MEDICATIONS USED:  MARCAINE     and OTHER exparel   SPECIMEN:  No Specimen  DISPOSITION OF SPECIMEN:  N/A  COUNTS:  YES  TOURNIQUET:  * No tourniquets in log *  DICTATION: .Dragon Dictation  PLAN OF CARE: Admit for overnight observation  PATIENT DISPOSITION:  PACU - hemodynamically stable.

## 2023-05-31 DIAGNOSIS — T84296A Other mechanical complication of internal fixation device of vertebrae, initial encounter: Secondary | ICD-10-CM | POA: Diagnosis not present

## 2023-05-31 MED ORDER — HYDROMORPHONE HCL 1 MG/ML IJ SOLN
0.5000 mg | INTRAMUSCULAR | Status: DC | PRN
  Administered 2023-05-31: 0.5 mg via INTRAVENOUS
  Administered 2023-06-01: 1 mg via INTRAVENOUS
  Filled 2023-05-31 (×2): qty 1

## 2023-05-31 MED ORDER — SODIUM CHLORIDE 0.9 % IV BOLUS
500.0000 mL | Freq: Once | INTRAVENOUS | Status: AC
  Administered 2023-05-31: 500 mL via INTRAVENOUS

## 2023-05-31 NOTE — TOC Transition Note (Signed)
 Transition of Care [facility]) - Discharge Note   Patient Details  Name: Lisa Novak MRN: [id number] Date of Birth: 07/18/1970  Transition of Care [facility]) CM/SW Contact:  Andrez [name], RN Phone Number: 05/31/2023, 2:04 PM   Clinical Narrative:     S/p stimulator placement. Pt will discharge home with self care. Walker for home in the room through [employer].  Pt has supervision at home and transportation to home.  Final next level of care: Home/Self Care Barriers to Discharge: No Barriers Identified   Patient Goals and CMS Choice     Choice offered to / list presented to : Patient      Discharge Placement                       Discharge Plan and Services Additional resources added to the After Visit Summary for                  DME Arranged: Walker rolling DME Agency: [employer] Date DME Agency Contacted: 05/31/23   Representative spoke with at DME Agency: d/c lounge            Social Drivers of Health (SDOH) Interventions SDOH Screenings   Food Insecurity: No Food Insecurity (05/31/2023)  Housing: Low Risk  (05/31/2023)  Transportation Needs: No Transportation Needs (05/31/2023)  Utilities: Not At Risk (05/31/2023)  Tobacco Use: Low Risk  (05/30/2023)     Readmission Risk Interventions     No data to display

## 2023-05-31 NOTE — Progress Notes (Signed)
    Subjective: Procedure(s) (LRB): SPINAL CORD STIMULATOR INSERTION, Removal of pedicle screw (N/A) REMOVAL POSTERIOR SPINAL INSTRUMENTATION (PEDICLE SCREW) (N/A) 1 Day Post-Op  Patient reports pain as 5 on 0-10 scale.  Reports none leg pain reports incisional back pain   Positive void Negative bowel movement Positive flatus Negative chest pain or shortness of breath  Objective: Vital signs in last 24 hours: Temp:  [97.4 F (36.3 C)-98.8 F (37.1 C)] 97.8 F (36.6 C) (01/03 0758) Pulse Rate:  [74-104] 90 (01/03 0758) Resp:  [7-20] 18 (01/03 0758) BP: (79-116)/(49-77) 111/74 (01/03 0758) SpO2:  [92 %-100 %] 98 % (01/03 0758)  Intake/Output from previous day: 01/02 0701 - 01/03 0700 In: 1000 [I.V.:1000] Out: 150 [Blood:150]  Labs: No results for input(s): WBC, RBC, HCT, PLT in the last 72 hours. No results for input(s): NA, K, CL, CO2, BUN, CREATININE, GLUCOSE, CALCIUM in the last 72 hours. No results for input(s): LABPT, INR in the last 72 hours.  Physical Exam: Neurologically intact ABD soft Intact pulses distally Dorsiflexion/Plantar flexion intact Incision: dressing C/D/I Compartment soft Body mass index is 29.24 kg/m.   Assessment/Plan: Patient stable  Continue mobilization with physical therapy Continue care  Patient was worse primary issue is incisional site pain.  Most notably in the thoracic and right paraspinal region. She is neurologically intact in the preoperative radicular leg pain has improved significantly. We will plan on weaning off of her Dilaudid  and continue oral narcotic medications and muscle relaxer. Hold on discharge for today given her significant pain.  We will also engage physical therapy to aid in her mobilization. Plan on discharge tomorrow if her pain is controlled.  Donaciano Sprang, MD [facility] [phone]

## 2023-05-31 NOTE — Evaluation (Signed)
 Physical Therapy Evaluation Patient Details Name: Lisa Novak MRN: [id number] DOB: 1970/06/09 Today's Date: 05/31/2023  History of Present Illness  Patient is a 53 y/o female admitted 05/30/23 with failed back syndrome and underwent removal of pedical screw rod L4-5, and for spinal cord stimulator placement.  PMH positive for DM, diverticulitis, GERD, HTN, RA.  Clinical Impression  Patient presents with decreased mobility due to pain, decreased activity tolerance, decreased strength and pain.  Patient previously independent though limited with IADL's.  She can mobilize with supervision though with increased time and effort.  She is stable for home when medically ready, but PT will follow up if not d/c today.  Likely no follow up PT needs initially.       If plan is discharge home, recommend the following: A little help with walking and/or transfers;Assist for transportation;Help with stairs or ramp for entrance;A little help with bathing/dressing/bathroom   Can travel by private vehicle        Equipment Recommendations None recommended by PT  Recommendations for Other Services       Functional Status Assessment Patient has had a recent decline in their functional status and demonstrates the ability to make significant improvements in function in a reasonable and predictable amount of time.     Precautions / Restrictions Precautions Precautions: Back Precaution Booklet Issued: Yes (comment)      Mobility  Bed Mobility Overal bed mobility: Needs Assistance Bed Mobility: Rolling, Sidelying to Sit, Sit to Sidelying Rolling: Supervision, Used rails Sidelying to sit: Supervision, Used rails     Sit to sidelying: Supervision, Used rails General bed mobility comments: cues for technique, increased time and painful    Transfers Overall transfer level: Needs assistance Equipment used: Rolling walker (2 wheels) Transfers: Sit to/from Stand Sit to Stand: Contact guard assist            General transfer comment: pulls up on walker using legs to help stand from EOB and to and from toilet in bathroom    Ambulation/Gait Ambulation/Gait assistance: Supervision, Contact guard assist Gait Distance (Feet): 180 Feet Assistive device: Rolling walker (2 wheels) Gait Pattern/deviations: Step-to pattern, Step-through pattern, Decreased stride length, Wide base of support       General Gait Details: slower pace and heavy UE use with tension in upper traps despite cues to relax and for breathing  Stairs Stairs: Yes Stairs assistance: Supervision Stair Management: Step to pattern, Sideways, One rail Left Number of Stairs: 2 General stair comments: cues for technique and demonstration  Wheelchair Mobility     Tilt Bed    Modified Rankin (Stroke Patients Only)       Balance Overall balance assessment: Needs assistance   Sitting balance-Leahy Scale: Good     Standing balance support: Reliant on assistive device for balance Standing balance-Leahy Scale: Fair Standing balance comment: mainly reliant on UE support due to pain                             Pertinent Vitals/Pain Pain Assessment Pain Assessment: 0-10 Pain Score: 6  Pain Location: lower back, R side Pain Descriptors / Indicators: Discomfort, Aching Pain Intervention(s): Monitored during session, Repositioned    Home Living Family/patient expects to be discharged to:: Private residence Living Arrangements: Spouse/significant other Available Help at Discharge: Family Type of Home: House Home Access: Stairs to enter Entrance Stairs-Rails: Doctor, General Practice of Steps: 4   Home Layout: One level Home Equipment: Rolling  Walker (2 wheels);Hand held shower head;Tub bench      Prior Function Prior Level of Function : Driving;Independent/Modified Independent                     Extremity/Trunk Assessment                Communication    Communication Communication: No apparent difficulties  Cognition Arousal: Alert Behavior During Therapy: WFL for tasks assessed/performed Overall Cognitive Status: Within Functional Limits for tasks assessed                                          General Comments General comments (skin integrity, edema, etc.): issued handout for precautions, discussed frequent short walks and using walker till pain is better    Exercises     Assessment/Plan    PT Assessment Patient needs continued PT services  PT Problem List Pain;Decreased mobility;Decreased strength;Decreased activity tolerance       PT Treatment Interventions DME instruction;Gait training;Stair training;Patient/family education;Therapeutic activities    PT Goals (Current goals can be found in the Care Plan section)  Acute Rehab PT Goals Patient Stated Goal: return to independent, taking care of grandbaby PT Goal Formulation: With patient Time For Goal Achievement: 06/14/23 Potential to Achieve Goals: Good    Frequency Min 1X/week     Co-evaluation               AM-PAC PT 6 Clicks Mobility  Outcome Measure Help needed turning from your back to your side while in a flat bed without using bedrails?: A Little Help needed moving from lying on your back to sitting on the side of a flat bed without using bedrails?: A Little Help needed moving to and from a bed to a chair (including a wheelchair)?: A Little Help needed standing up from a chair using your arms (e.g., wheelchair or bedside chair)?: A Little Help needed to walk in hospital room?: A Little Help needed climbing 3-5 steps with a railing? : A Little 6 Click Score: 18    End of Session Equipment Utilized During Treatment: Gait belt Activity Tolerance: Patient tolerated treatment well Patient left: in bed;with call bell/phone within reach   PT Visit Diagnosis: Difficulty in walking, not elsewhere classified (R26.2);Pain Pain - part of  body:  (back)    Time: 1228-1259 PT Time Calculation (min) (ACUTE ONLY): 31 min   Charges:   PT Evaluation $PT Eval Low Complexity: 1 Low PT Treatments $Gait Training: 8-22 mins PT General Charges $$ ACUTE PT VISIT: 1 Visit         Lisa Novak, PT Acute Rehabilitation Services Office:[phone] 05/31/2023   Lisa Novak 05/31/2023, 1:42 PM

## 2023-05-31 NOTE — Plan of Care (Signed)
  Problem: Safety: Goal: Ability to remain free from injury will improve Outcome: Progressing   Problem: Skin Integrity: Goal: Risk for impaired skin integrity will decrease Outcome: Progressing   Problem: Activity: Goal: Ability to tolerate increased activity will improve Outcome: Progressing   Problem: Pain Management: Goal: Pain level will decrease Outcome: Progressing

## 2023-06-01 DIAGNOSIS — T84296A Other mechanical complication of internal fixation device of vertebrae, initial encounter: Secondary | ICD-10-CM | POA: Diagnosis not present

## 2023-06-01 LAB — CBC
HCT: 31.2 % — ABNORMAL LOW (ref 36.0–46.0)
Hemoglobin: 10.3 g/dL — ABNORMAL LOW (ref 12.0–15.0)
MCH: 29 pg (ref 26.0–34.0)
MCHC: 33 g/dL (ref 30.0–36.0)
MCV: 87.9 fL (ref 80.0–100.0)
Platelets: 186 10*3/uL (ref 150–400)
RBC: 3.55 MIL/uL — ABNORMAL LOW (ref 3.87–5.11)
RDW: 14.5 % (ref 11.5–15.5)
WBC: 9.1 10*3/uL (ref 4.0–10.5)
nRBC: 0 % (ref 0.0–0.2)

## 2023-06-01 MED ORDER — ACETAMINOPHEN 650 MG RE SUPP: 650.0000 mg | Freq: Four times a day (QID) | RECTAL | Status: DC

## 2023-06-01 MED ORDER — SODIUM CHLORIDE 0.9 % IV BOLUS
500.0000 mL | Freq: Once | INTRAVENOUS | Status: AC
  Administered 2023-06-01: 500 mL via INTRAVENOUS

## 2023-06-01 MED ORDER — ACETAMINOPHEN 500 MG PO TABS
1000.0000 mg | ORAL_TABLET | Freq: Four times a day (QID) | ORAL | Status: DC
  Administered 2023-06-01 (×2): 1000 mg via ORAL
  Filled 2023-06-01 (×2): qty 2

## 2023-06-01 MED ORDER — KETOROLAC TROMETHAMINE 15 MG/ML IJ SOLN
7.5000 mg | Freq: Once | INTRAMUSCULAR | Status: AC
  Administered 2023-06-01: 7.5 mg via INTRAVENOUS
  Filled 2023-06-01: qty 1

## 2023-06-01 NOTE — Progress Notes (Signed)
 Physical Therapy Treatment and Discharge Patient Details Name: Lisa Novak MRN: [id number] DOB: 06-Dec-1970 Today's Date: 06/01/2023   History of Present Illness Patient is a 53 y/o female admitted 05/30/23 with failed back syndrome and underwent removal of pedical screw rod L4-5, and for spinal cord stimulator placement.  PMH positive for DM, diverticulitis, GERD, HTN, RA.    PT Comments  Patient overall moving well, at modified independent level except supervision for stairs. She is able to verbalize and adhere to all back precautions. She was up walking in halls with nursing earlier today. All education complete and goals met. Discharge from PT.     If plan is discharge home, recommend the following: A little help with walking and/or transfers;Assist for transportation;Help with stairs or ramp for entrance;A little help with bathing/dressing/bathroom   Can travel by private vehicle        Equipment Recommendations  None recommended by PT    Recommendations for Other Services       Precautions / Restrictions Precautions Precautions: Back Precaution Booklet Issued: Yes (comment) Precaution Comments: pt able to state and adhere to back precautions     Mobility  Bed Mobility Overal bed mobility: Needs Assistance Bed Mobility: Rolling, Sidelying to Sit Rolling: Used rails, Modified independent (Device/Increase time) Sidelying to sit: Used rails, Modified independent (Device/Increase time)       General bed mobility comments: pt able to verbalize correct technique and then demonstrate    Transfers Overall transfer level: Modified independent Equipment used: Rolling walker (2 wheels) Transfers: Sit to/from Stand Sit to Stand: Modified independent (Device/Increase time)           General transfer comment: pushes down through walker using legs to help stand from EOB, toilet, and recliner. No tipping of RW and reports it is less painful than pushing off the furniture     Ambulation/Gait Ambulation/Gait assistance: Modified independent (Device/Increase time) Gait Distance (Feet): 250 Feet Assistive device: Rolling walker (2 wheels) Gait Pattern/deviations: Step-through pattern, Decreased stride length, Wide base of support   Gait velocity interpretation: 1.31 - 2.62 ft/sec, indicative of limited community ambulator   General Gait Details: slower pace and initially heavy UE use with tension in upper traps ; able to relax UEs with cues   Stairs         General stair comments: pt did not feel the need to practice steps again prior to discharge   Wheelchair Mobility     Tilt Bed    Modified Rankin (Stroke Patients Only)       Balance Overall balance assessment: Needs assistance   Sitting balance-Leahy Scale: Good     Standing balance support: No upper extremity supported Standing balance-Leahy Scale: Fair                              Cognition Arousal: Alert Behavior During Therapy: WFL for tasks assessed/performed Overall Cognitive Status: Within Functional Limits for tasks assessed                                          Exercises      General Comments General comments (skin integrity, edema, etc.): Educated on 3 walks/day and sitting up in chair as able.      Pertinent Vitals/Pain Pain Assessment Pain Assessment: 0-10 Pain Score: 6  Pain Location: lower back, R  side Pain Descriptors / Indicators: Discomfort, Aching Pain Intervention(s): Limited activity within patient's tolerance, Monitored during session, Patient requesting pain meds-RN notified, RN gave pain meds during session    Home Living                          Prior Function            PT Goals (current goals can now be found in the care plan section) Acute Rehab PT Goals Patient Stated Goal: return to independent, taking care of grandbaby PT Goal Formulation: With patient Time For Goal Achievement:  06/14/23 Potential to Achieve Goals: Good Progress towards PT goals: Goals met/education completed, patient discharged from PT    Frequency    Min 1X/week      PT Plan      Co-evaluation              AM-PAC PT 6 Clicks Mobility   Outcome Measure  Help needed turning from your back to your side while in a flat bed without using bedrails?: None Help needed moving from lying on your back to sitting on the side of a flat bed without using bedrails?: None Help needed moving to and from a bed to a chair (including a wheelchair)?: None Help needed standing up from a chair using your arms (e.g., wheelchair or bedside chair)?: None Help needed to walk in hospital room?: None Help needed climbing 3-5 steps with a railing? : A Little 6 Click Score: 23    End of Session   Activity Tolerance: Patient tolerated treatment well Patient left: with call bell/phone within reach;in chair Nurse Communication: Mobility status PT Visit Diagnosis: Difficulty in walking, not elsewhere classified (R26.2);Pain Pain - Right/Left:  (bil) Pain - part of body:  (back)   PT Discharge Note  Patient is being discharged from PT services secondary to:  Goals met and no further therapy needs identified.  Please see latest Therapy Progress Note for current level of functioning and progress toward goals.  Progress and discharge plan and discussed with patient/caregiver and they  Agree    Time: [id number] PT Time Calculation (min) (ACUTE ONLY): 25 min  Charges:    $Gait Training: 8-22 mins $Self Care/Home Management: 8-22 PT General Charges $$ ACUTE PT VISIT: 1 Visit                      Macario RAMAN, PT Acute Rehabilitation Services  Office [phone]    Macario SHAUNNA Soja 06/01/2023, 1:29 PM

## 2023-06-01 NOTE — Plan of Care (Signed)

## 2023-06-01 NOTE — Progress Notes (Signed)
   Subjective: 2 Days Post-Op Procedure(s) (LRB): SPINAL CORD STIMULATOR INSERTION, Removal of pedicle screw (N/A) REMOVAL POSTERIOR SPINAL INSTRUMENTATION (PEDICLE SCREW) (N/A) Patient reports pain as moderate.   Patient seen in rounds for Dr. Burnetta.. Patient is resting in bed on exam this morning. She reports difficulty with incisional back pain, especially since she has not been able to take oxycodone  related to her low blood pressure. She tells me she has not been up to walk much. She is voiding without difficulty. No pain elsewhere.  We will start therapy today.   Objective: Vital signs in last 24 hours: Temp:  [98.1 F (36.7 C)-98.8 F (37.1 C)] 98.1 F (36.7 C) (01/04 0821) Pulse Rate:  [91-109] 94 (01/04 0821) Resp:  [17-18] 18 (01/04 0821) BP: (93-112)/(58-77) 112/75 (01/04 0821) SpO2:  [93 %-98 %] 97 % (01/04 0821)  Intake/Output from previous day:  Intake/Output Summary (Last 24 hours) at 06/01/2023 0847 Last data filed at 06/01/2023 0200 Gross per 24 hour  Intake 500 ml  Output 2 ml  Net 498 ml     Intake/Output this shift: No intake/output data recorded.  Labs: No results for input(s): HGB in the last 72 hours. No results for input(s): WBC, RBC, HCT, PLT in the last 72 hours. No results for input(s): NA, K, CL, CO2, BUN, CREATININE, GLUCOSE, CALCIUM in the last 72 hours. No results for input(s): LABPT, INR in the last 72 hours.  Exam: General - Patient is Alert and Oriented Extremity - Neurologically intact Sensation intact distally Intact pulses distally Dorsiflexion/Plantar flexion intact Dressing - honeycomb dressings C/D/I Motor Function - intact, moving foot and toes well on exam.   Past Medical History:  Diagnosis Date   Diabetes type 2, controlled (HCC)    Diverticulitis    GERD (gastroesophageal reflux disease)    Hypertension    PONV (postoperative nausea and vomiting)    RA (rheumatoid arthritis) (HCC)    SVD  (spontaneous vaginal delivery)    x 3   Umbilical hernia     Assessment/Plan: 2 Days Post-Op Procedure(s) (LRB): SPINAL CORD STIMULATOR INSERTION, Removal of pedicle screw (N/A) REMOVAL POSTERIOR SPINAL INSTRUMENTATION (PEDICLE SCREW) (N/A) Principal Problem:   S/P insertion of spinal cord stimulator Active Problems:   Chronic pain  Estimated body mass index is 29.24 kg/m as calculated from the following:   Height as of this encounter: 5' 4.5 (1.638 m).   Weight as of this encounter: 78.5 kg.   Will hold antihypertensives today in setting of hypotension Encouraged PO fluids   Will give toradol , tylenol , methocarbamol  now Up with PT as able   Rosina Calin, PA-C Orthopedic Surgery [phone] 06/01/2023, 8:47 AM

## 2023-06-01 NOTE — Plan of Care (Signed)
 Pt alert and oriented x4. Complains of surgical pain, see MAR. Iv is patent and saline locked. Continent of bowel and bladder. Ambulating to bathroom and on unit this shift. Will continue with current plan of care.  Problem: Education: Goal: Knowledge of General Education information will improve Description: Including pain rating scale, medication(s)/side effects and non-pharmacologic comfort measures Outcome: Progressing   Problem: Health Behavior/Discharge Planning: Goal: Ability to manage health-related needs will improve Outcome: Progressing   Problem: Clinical Measurements: Goal: Ability to maintain clinical measurements within normal limits will improve Outcome: Progressing Goal: Will remain free from infection Outcome: Progressing Goal: Diagnostic test results will improve Outcome: Progressing Goal: Respiratory complications will improve Outcome: Progressing Goal: Cardiovascular complication will be avoided Outcome: Progressing   Problem: Activity: Goal: Risk for activity intolerance will decrease Outcome: Progressing   Problem: Nutrition: Goal: Adequate nutrition will be maintained Outcome: Progressing   Problem: Coping: Goal: Level of anxiety will decrease Outcome: Progressing   Problem: Elimination: Goal: Will not experience complications related to bowel motility Outcome: Progressing Goal: Will not experience complications related to urinary retention Outcome: Progressing   Problem: Pain Management: Goal: General experience of comfort will improve Outcome: Progressing   Problem: Safety: Goal: Ability to remain free from injury will improve Outcome: Progressing   Problem: Skin Integrity: Goal: Risk for impaired skin integrity will decrease Outcome: Progressing   Problem: Education: Goal: Ability to verbalize activity precautions or restrictions will improve Outcome: Progressing Goal: Knowledge of the prescribed therapeutic regimen will  improve Outcome: Progressing Goal: Understanding of discharge needs will improve Outcome: Progressing   Problem: Activity: Goal: Ability to avoid complications of mobility impairment will improve Outcome: Progressing Goal: Ability to tolerate increased activity will improve Outcome: Progressing Goal: Will remain free from falls Outcome: Progressing   Problem: Bowel/Gastric: Goal: Gastrointestinal status for postoperative course will improve Outcome: Progressing   Problem: Clinical Measurements: Goal: Ability to maintain clinical measurements within normal limits will improve Outcome: Progressing Goal: Postoperative complications will be avoided or minimized Outcome: Progressing Goal: Diagnostic test results will improve Outcome: Progressing   Problem: Pain Management: Goal: Pain level will decrease Outcome: Progressing   Problem: Skin Integrity: Goal: Will show signs of wound healing Outcome: Progressing   Problem: Health Behavior/Discharge Planning: Goal: Identification of resources available to assist in meeting health care needs will improve Outcome: Progressing   Problem: Bladder/Genitourinary: Goal: Urinary functional status for postoperative course will improve Outcome: Progressing

## 2023-06-05 ENCOUNTER — Encounter (HOSPITAL_COMMUNITY): Payer: Self-pay | Admitting: Orthopedic Surgery

## 2023-06-07 NOTE — Discharge Summary (Signed)
 Patient ID: Lisa Novak MRN: [id number] DOB/AGE: March 15, 1971 53 y.o.  Admit date: 05/30/2023 Discharge date: 06/01/2023  Admission Diagnoses:  Malpositionedlumbar pedicle screw, chronic pain syndrome, status post successful spinal cord stimulator trial   Discharge Diagnoses:  Principal Problem:   S/P insertion of spinal cord stimulator Active Problems:   Chronic pain   Past Medical History:  Diagnosis Date   Diabetes type 2, controlled (HCC)    Diverticulitis    GERD (gastroesophageal reflux disease)    Hypertension    PONV (postoperative nausea and vomiting)    RA (rheumatoid arthritis) (HCC)    SVD (spontaneous vaginal delivery)    x 3   Umbilical hernia     Surgeries: Procedure(s): SPINAL CORD STIMULATOR INSERTION, Removal of pedicle screw REMOVAL POSTERIOR SPINAL INSTRUMENTATION (PEDICLE SCREW) on 05/30/2023   Consultants:   Discharged Condition: Improved  Hospital Course: Lisa Novak is an 53 y.o. female who was admitted 05/30/2023 for operative treatment ofS/P insertion of spinal cord stimulator. Patient has severe unremitting pain that affects sleep, daily activities, and work/hobbies. After pre-op clearance the patient was taken to the operating room on 05/30/2023 and underwent  Procedure(s): SPINAL CORD STIMULATOR INSERTION, Removal of pedicle screw REMOVAL POSTERIOR SPINAL INSTRUMENTATION (PEDICLE SCREW).    Patient was given perioperative antibiotics:  Anti-infectives (From admission, onward)    Start     Dose/Rate Route Frequency Ordered Stop   05/30/23 1815  ceFAZolin  (ANCEF ) IVPB 1 g/50 mL premix        1 g 100 mL/hr over 30 Minutes Intravenous Every 8 hours 05/30/23 1717 05/31/23 0156   05/30/23 0546  ceFAZolin  (ANCEF ) IVPB 2g/100 mL premix        2 g 200 mL/hr over 30 Minutes Intravenous 30 min pre-op 05/30/23 0546 05/30/23 0800        Patient was given sequential compression devices, early ambulation, and chemoprophylaxis to prevent DVT.  Patient worked with PT and was meeting their goals regarding safe ambulation and transfers.  Patient benefited maximally from hospital stay and there were no complications.    Recent vital signs: No data found.   Recent laboratory studies: No results for input(s): WBC, HGB, HCT, PLT, NA, K, CL, CO2, BUN, CREATININE, GLUCOSE, INR, CALCIUM in the last 72 hours.  Invalid input(s): PT, 2   Discharge Medications:   Allergies as of 06/01/2023       Reactions   Topiramate Other (See Comments)   tachycardia        Medication List     STOP taking these medications    HYDROcodone -acetaminophen  10-325 MG tablet Commonly known as: NORCO       TAKE these medications    calcium carbonate 500 MG chewable tablet Commonly known as: TUMS - dosed in mg elemental calcium Chew 1 tablet by mouth daily as needed for indigestion or heartburn.   DULoxetine  30 MG capsule Commonly known as: CYMBALTA  Take 30 mg by mouth daily.   folic acid 1 MG tablet Commonly known as: FOLVITE Take 2 mg by mouth daily.   gabapentin  300 MG capsule Commonly known as: NEURONTIN  Take 300 mg by mouth 2 (two) times daily.   olmesartan -hydrochlorothiazide  40-12.5 MG tablet Commonly known as: BENICAR  HCT Take 1 tablet by mouth daily.   omeprazole  20 MG capsule Commonly known as: PRILOSEC TAKE 1 CAPSULE BY MOUTH EVERY DAY   ondansetron  4 MG disintegrating tablet Commonly known as: ZOFRAN -ODT Take 4 mg by mouth every 8 (eight) hours as needed for vomiting or  nausea.   Ozempic (1 MG/DOSE) 4 MG/3ML Sopn Generic drug: Semaglutide (1 MG/DOSE) Inject 1 mg as directed every Thursday.   sulfaSALAzine 500 MG tablet Commonly known as: AZULFIDINE Take 1,000 mg by mouth daily.       ASK your doctor about these medications    methocarbamol  500 MG tablet Commonly known as: ROBAXIN  Take 1 tablet (500 mg total) by mouth every 8 (eight) hours as needed for up to 5 days for muscle  spasms. Ask about: Should I take this medication?   oxyCODONE -acetaminophen  10-325 MG tablet Commonly known as: Percocet Take 1 tablet by mouth every 6 (six) hours as needed for up to 5 days for pain. Ask about: Should I take this medication?        Diagnostic Studies: DG THORACOLUMBAR SPINE Result Date: 05/30/2023 CLINICAL DATA:  Pedicle screw removal and spinal cord stimulator placement EXAM: THORACOLUMBAR SPINE 1V COMPARISON:  12/17/2022 FINDINGS: Six fluoroscopic images are obtained during the performance of the procedure and are provided for interpretation only. Images demonstrate removal of the posterior fusion hardware at the L4-5 level. Disc spacer remains in place. Spinal cord stimulator is identified, leads in the lower thoracic central canal. Please refer to the operative report. Fluoroscopy time: 1 minute 23 seconds, 49.78 mGy IMPRESSION: 1. Intraoperative exam as above. Electronically Signed   By: Ozell Daring M.D.   On: 05/30/2023 14:59   DG C-Arm 1-60 Min-No Report Result Date: 05/30/2023 Fluoroscopy was utilized by the requesting physician.  No radiographic interpretation.   DG C-Arm 1-60 Min-No Report Result Date: 05/30/2023 Fluoroscopy was utilized by the requesting physician.  No radiographic interpretation.   DG C-Arm 1-60 Min-No Report Result Date: 05/30/2023 Fluoroscopy was utilized by the requesting physician.  No radiographic interpretation.    Disposition: Discharge disposition: 01-Home or Self Care       Discharge Instructions     Incentive spirometry RT   Complete by: As directed         Follow-up Information     Burnetta Aures, MD. Schedule an appointment as soon as possible for a visit in 2 week(s).   Specialty: Orthopedic Surgery Why: If symptoms worsen, For suture removal, For wound re-check Contact information: [address] [place] [address] [phone]                  Signed: Rosina JONELLE Calin 06/07/2023, 2:00 PM

## 2023-06-14 ENCOUNTER — Encounter (HOSPITAL_COMMUNITY): Payer: Self-pay | Admitting: Orthopedic Surgery

## 2023-07-29 ENCOUNTER — Other Ambulatory Visit: Payer: Self-pay | Admitting: Obstetrics and Gynecology

## 2023-07-29 DIAGNOSIS — Z1231 Encounter for screening mammogram for malignant neoplasm of breast: Secondary | ICD-10-CM

## 2023-07-30 ENCOUNTER — Ambulatory Visit
Admission: RE | Admit: 2023-07-30 | Discharge: 2023-07-30 | Discharge status: Home / Self Care | Source: Ambulatory Visit | Attending: Obstetrics and Gynecology | Admitting: Obstetrics and Gynecology

## 2023-07-30 DIAGNOSIS — Z1231 Encounter for screening mammogram for malignant neoplasm of breast: Secondary | ICD-10-CM

## 2024-02-25 ENCOUNTER — Other Ambulatory Visit: Payer: Self-pay | Admitting: Orthopedic Surgery

## 2024-02-25 DIAGNOSIS — M545 Low back pain, unspecified: Secondary | ICD-10-CM

## 2024-03-10 ENCOUNTER — Telehealth: Payer: Self-pay

## 2024-03-10 NOTE — Discharge Instructions (Signed)

## 2024-03-10 NOTE — Progress Notes (Signed)
 See telephone note

## 2024-03-11 ENCOUNTER — Ambulatory Visit
Admission: RE | Admit: 2024-03-11 | Discharge: 2024-03-11 | Disposition: A | Discharge status: Home / Self Care | Source: Ambulatory Visit | Attending: Orthopedic Surgery | Admitting: Orthopedic Surgery

## 2024-03-11 ENCOUNTER — Telehealth: Payer: Self-pay

## 2024-03-11 ENCOUNTER — Ambulatory Visit
Admission: RE | Admit: 2024-03-11 | Discharge: 2024-03-11 | Disposition: A | Source: Ambulatory Visit | Attending: Orthopedic Surgery | Admitting: Orthopedic Surgery

## 2024-03-11 DIAGNOSIS — M545 Low back pain, unspecified: Secondary | ICD-10-CM

## 2024-03-11 MED ORDER — IOPAMIDOL (ISOVUE-M 200) INJECTION 41%
20.0000 mL | Freq: Once | INTRAMUSCULAR | Status: AC
  Administered 2024-03-11: 20 mL via INTRATHECAL

## 2024-03-11 MED ORDER — MEPERIDINE HCL 50 MG/ML IJ SOLN: 50.0000 mg | Freq: Once | INTRAMUSCULAR | Status: DC | PRN

## 2024-03-11 MED ORDER — ONDANSETRON HCL 4 MG/2ML IJ SOLN: 4.0000 mg | Freq: Once | INTRAMUSCULAR | Status: DC | PRN

## 2024-03-11 MED ORDER — DIAZEPAM 5 MG PO TABS
10.0000 mg | ORAL_TABLET | Freq: Once | ORAL | Status: AC
  Administered 2024-03-11: 10 mg via ORAL
